# Patient Record
Sex: Female | Born: 1960 | Race: White | Hispanic: No | Marital: Married | Smoking: Never smoker
Health system: Southern US, Community
[De-identification: ages and names within clinical notes are randomized; demographics above are authoritative.]

## PROBLEM LIST (undated history)

## (undated) DIAGNOSIS — M069 Rheumatoid arthritis, unspecified: Secondary | ICD-10-CM

## (undated) DIAGNOSIS — E785 Hyperlipidemia, unspecified: Secondary | ICD-10-CM

## (undated) DIAGNOSIS — J45909 Unspecified asthma, uncomplicated: Secondary | ICD-10-CM

## (undated) DIAGNOSIS — F419 Anxiety disorder, unspecified: Secondary | ICD-10-CM

## (undated) DIAGNOSIS — F32A Depression, unspecified: Secondary | ICD-10-CM

## (undated) DIAGNOSIS — G4730 Sleep apnea, unspecified: Secondary | ICD-10-CM

## (undated) DIAGNOSIS — Z9109 Other allergy status, other than to drugs and biological substances: Secondary | ICD-10-CM

## (undated) DIAGNOSIS — F329 Major depressive disorder, single episode, unspecified: Secondary | ICD-10-CM

## (undated) DIAGNOSIS — E119 Type 2 diabetes mellitus without complications: Secondary | ICD-10-CM

## (undated) DIAGNOSIS — I10 Essential (primary) hypertension: Secondary | ICD-10-CM

## (undated) HISTORY — DX: Other allergy status, other than to drugs and biological substances: Z91.09

## (undated) HISTORY — PX: FOOT SURGERY: SHX648

## (undated) HISTORY — DX: Type 2 diabetes mellitus without complications: E11.9

## (undated) HISTORY — DX: Depression, unspecified: F32.A

## (undated) HISTORY — PX: OTHER SURGICAL HISTORY: SHX169

## (undated) HISTORY — DX: Sleep apnea, unspecified: G47.30

## (undated) HISTORY — DX: Unspecified asthma, uncomplicated: J45.909

## (undated) HISTORY — DX: Rheumatoid arthritis, unspecified: M06.9

## (undated) HISTORY — DX: Hyperlipidemia, unspecified: E78.5

## (undated) HISTORY — DX: Anxiety disorder, unspecified: F41.9

## (undated) HISTORY — PX: DENTAL SURGERY: SHX609

## (undated) HISTORY — DX: Major depressive disorder, single episode, unspecified: F32.9

---

## 1998-06-25 HISTORY — PX: CHOLECYSTECTOMY: SHX55

## 2005-05-23 ENCOUNTER — Ambulatory Visit: Payer: Self-pay | Admitting: Unknown Physician Specialty

## 2005-08-31 ENCOUNTER — Other Ambulatory Visit: Payer: Self-pay

## 2005-08-31 ENCOUNTER — Emergency Department: Payer: Self-pay | Admitting: Emergency Medicine

## 2006-07-03 ENCOUNTER — Ambulatory Visit: Payer: Self-pay | Admitting: Unknown Physician Specialty

## 2006-09-09 ENCOUNTER — Ambulatory Visit: Payer: Self-pay | Admitting: Internal Medicine

## 2007-12-15 ENCOUNTER — Emergency Department: Payer: Self-pay | Admitting: Emergency Medicine

## 2010-04-25 ENCOUNTER — Ambulatory Visit: Payer: Self-pay

## 2010-07-14 ENCOUNTER — Ambulatory Visit: Payer: Self-pay | Admitting: General Surgery

## 2010-07-17 LAB — PATHOLOGY REPORT

## 2011-09-26 ENCOUNTER — Ambulatory Visit: Payer: Self-pay | Admitting: Internal Medicine

## 2012-09-17 ENCOUNTER — Ambulatory Visit: Payer: Self-pay | Admitting: Physician Assistant

## 2013-10-08 ENCOUNTER — Other Ambulatory Visit: Payer: Self-pay | Admitting: Physician Assistant

## 2013-10-08 LAB — BASIC METABOLIC PANEL
ANION GAP: 7 (ref 7–16)
BUN: 16 mg/dL (ref 7–18)
CHLORIDE: 103 mmol/L (ref 98–107)
CREATININE: 0.71 mg/dL (ref 0.60–1.30)
Calcium, Total: 9.2 mg/dL (ref 8.5–10.1)
Co2: 28 mmol/L (ref 21–32)
EGFR (African American): >60
EGFR (Non-African Amer.): >60
GLUCOSE: 102 mg/dL — AB (ref 65–99)
OSMOLALITY: 277 (ref 275–301)
Potassium: 3.7 mmol/L (ref 3.5–5.1)
Sodium: 138 mmol/L (ref 136–145)

## 2013-10-08 LAB — CBC WITH DIFFERENTIAL/PLATELET
Basophil #: 0.1 10*3/uL (ref 0.0–0.1)
Basophil %: 0.6 %
EOS PCT: 4.2 %
Eosinophil #: 0.4 10*3/uL (ref 0.0–0.7)
HCT: 38.2 % (ref 35.0–47.0)
HGB: 13 g/dL (ref 12.0–16.0)
LYMPHS PCT: 39.6 %
Lymphocyte #: 3.6 10*3/uL (ref 1.0–3.6)
MCH: 30.7 pg (ref 26.0–34.0)
MCHC: 34 g/dL (ref 32.0–36.0)
MCV: 90 fL (ref 80–100)
MONO ABS: 0.5 x10 3/mm (ref 0.2–0.9)
MONOS PCT: 5.1 %
NEUTROS PCT: 50.5 %
Neutrophil #: 4.6 10*3/uL (ref 1.4–6.5)
PLATELETS: 245 10*3/uL (ref 150–440)
RBC: 4.23 10*6/uL (ref 3.80–5.20)
RDW: 14.1 % (ref 11.5–14.5)
WBC: 9.1 10*3/uL (ref 3.6–11.0)

## 2013-10-08 LAB — TSH: Thyroid Stimulating Horm: 2.24 u[IU]/mL

## 2014-08-11 ENCOUNTER — Ambulatory Visit: Payer: Self-pay | Admitting: Physician Assistant

## 2014-08-25 ENCOUNTER — Ambulatory Visit: Payer: Self-pay | Admitting: Internal Medicine

## 2015-10-04 DIAGNOSIS — K649 Unspecified hemorrhoids: Secondary | ICD-10-CM | POA: Diagnosis not present

## 2015-10-10 DIAGNOSIS — H6993 Unspecified Eustachian tube disorder, bilateral: Secondary | ICD-10-CM | POA: Diagnosis not present

## 2015-10-10 DIAGNOSIS — J301 Allergic rhinitis due to pollen: Secondary | ICD-10-CM | POA: Diagnosis not present

## 2015-10-10 DIAGNOSIS — J0191 Acute recurrent sinusitis, unspecified: Secondary | ICD-10-CM | POA: Diagnosis not present

## 2015-12-15 DIAGNOSIS — F3342 Major depressive disorder, recurrent, in full remission: Secondary | ICD-10-CM | POA: Diagnosis not present

## 2015-12-15 DIAGNOSIS — M159 Polyosteoarthritis, unspecified: Secondary | ICD-10-CM | POA: Diagnosis not present

## 2015-12-15 DIAGNOSIS — R7301 Impaired fasting glucose: Secondary | ICD-10-CM | POA: Diagnosis not present

## 2015-12-15 DIAGNOSIS — I10 Essential (primary) hypertension: Secondary | ICD-10-CM | POA: Diagnosis not present

## 2016-03-08 DIAGNOSIS — R7301 Impaired fasting glucose: Secondary | ICD-10-CM | POA: Diagnosis not present

## 2016-03-08 DIAGNOSIS — Z0001 Encounter for general adult medical examination with abnormal findings: Secondary | ICD-10-CM | POA: Diagnosis not present

## 2016-03-08 DIAGNOSIS — E782 Mixed hyperlipidemia: Secondary | ICD-10-CM | POA: Diagnosis not present

## 2016-03-08 DIAGNOSIS — I10 Essential (primary) hypertension: Secondary | ICD-10-CM | POA: Diagnosis not present

## 2016-03-12 DIAGNOSIS — R55 Syncope and collapse: Secondary | ICD-10-CM | POA: Diagnosis not present

## 2016-03-12 DIAGNOSIS — E1165 Type 2 diabetes mellitus with hyperglycemia: Secondary | ICD-10-CM | POA: Diagnosis not present

## 2016-03-21 DIAGNOSIS — R55 Syncope and collapse: Secondary | ICD-10-CM | POA: Diagnosis not present

## 2016-04-17 DIAGNOSIS — F3342 Major depressive disorder, recurrent, in full remission: Secondary | ICD-10-CM | POA: Diagnosis not present

## 2016-04-17 DIAGNOSIS — I10 Essential (primary) hypertension: Secondary | ICD-10-CM | POA: Diagnosis not present

## 2016-04-17 DIAGNOSIS — R55 Syncope and collapse: Secondary | ICD-10-CM | POA: Diagnosis not present

## 2016-04-17 DIAGNOSIS — E1165 Type 2 diabetes mellitus with hyperglycemia: Secondary | ICD-10-CM | POA: Diagnosis not present

## 2016-04-25 DIAGNOSIS — Z0100 Encounter for examination of eyes and vision without abnormal findings: Secondary | ICD-10-CM | POA: Diagnosis not present

## 2016-07-09 DIAGNOSIS — K219 Gastro-esophageal reflux disease without esophagitis: Secondary | ICD-10-CM | POA: Diagnosis not present

## 2016-07-09 DIAGNOSIS — I10 Essential (primary) hypertension: Secondary | ICD-10-CM | POA: Diagnosis not present

## 2016-07-09 DIAGNOSIS — F411 Generalized anxiety disorder: Secondary | ICD-10-CM | POA: Diagnosis not present

## 2016-07-09 DIAGNOSIS — E1165 Type 2 diabetes mellitus with hyperglycemia: Secondary | ICD-10-CM | POA: Diagnosis not present

## 2016-08-08 ENCOUNTER — Emergency Department: Payer: Self-pay

## 2016-08-08 ENCOUNTER — Emergency Department
Admission: EM | Admit: 2016-08-08 | Discharge: 2016-08-08 | Disposition: A | Discharge status: Home / Self Care | Payer: Self-pay | Attending: Emergency Medicine | Admitting: Emergency Medicine

## 2016-08-08 DIAGNOSIS — R0789 Other chest pain: Secondary | ICD-10-CM | POA: Insufficient documentation

## 2016-08-08 DIAGNOSIS — R079 Chest pain, unspecified: Secondary | ICD-10-CM

## 2016-08-08 DIAGNOSIS — Z79899 Other long term (current) drug therapy: Secondary | ICD-10-CM | POA: Insufficient documentation

## 2016-08-08 DIAGNOSIS — E119 Type 2 diabetes mellitus without complications: Secondary | ICD-10-CM | POA: Insufficient documentation

## 2016-08-08 DIAGNOSIS — I10 Essential (primary) hypertension: Secondary | ICD-10-CM | POA: Insufficient documentation

## 2016-08-08 HISTORY — DX: Essential (primary) hypertension: I10

## 2016-08-08 LAB — BASIC METABOLIC PANEL
Anion gap: 9 (ref 5–15)
BUN: 18 mg/dL (ref 6–20)
CHLORIDE: 101 mmol/L (ref 101–111)
CO2: 29 mmol/L (ref 22–32)
Calcium: 9.7 mg/dL (ref 8.9–10.3)
Creatinine, Ser: 0.88 mg/dL (ref 0.44–1.00)
GFR calc non Af Amer: >60 mL/min (ref >60)
Glucose, Bld: 139 mg/dL — ABNORMAL HIGH (ref 65–99)
Potassium: 3.7 mmol/L (ref 3.5–5.1)
SODIUM: 139 mmol/L (ref 135–145)

## 2016-08-08 LAB — TROPONIN I: Troponin I: <0.03 ng/mL (ref <0.03)

## 2016-08-08 LAB — CBC
HEMATOCRIT: 37.2 % (ref 35.0–47.0)
Hemoglobin: 12.9 g/dL (ref 12.0–16.0)
MCH: 30.1 pg (ref 26.0–34.0)
MCHC: 34.5 g/dL (ref 32.0–36.0)
MCV: 87.1 fL (ref 80.0–100.0)
Platelets: 241 10*3/uL (ref 150–440)
RBC: 4.28 MIL/uL (ref 3.80–5.20)
RDW: 13.9 % (ref 11.5–14.5)
WBC: 8.5 10*3/uL (ref 3.6–11.0)

## 2016-08-08 LAB — FIBRIN DERIVATIVES D-DIMER (ARMC ONLY): Fibrin derivatives D-dimer (ARMC): 279 (ref 0–499)

## 2016-08-08 NOTE — ED Provider Notes (Signed)
Time Seen: Approximately 1501  I have reviewed the triage notes  Chief Complaint: Chest Pain   History of Present Illness: Kristen Sparks is a 56 y.o. female who presents with intermittent left-sided chest discomfort. She states pain radiates to her left shoulder and underneath her left breast. She states the pain is very transient in nature and started yesterday. She states occasionally gets worse with movement and she's been doing a lot of lifting and moving of articles. She also has history of esophageal reflux disease. She has no cardiovascular history. She has risk factors for hypertension and diabetes. She denies any family history. Patient denies any fever or productive cough or pleuritic chest pain. She denies any abdominal discomfort. He shouldn't denies any arm pain or pleuritic discomfort. She denies any melena or hematochezia. She states she had a stress echocardiogram performed approximately a month ago. She states she's had a lot of burping and belching and vomited 2. No continuous nausea   Past Medical History:  Diagnosis Date  . Diabetes mellitus without complication ([place])   . Hypertension     There are no active problems to display for this patient.   Past Surgical History:  Procedure Laterality Date  . CHOLECYSTECTOMY      Past Surgical History:  Procedure Laterality Date  . CHOLECYSTECTOMY      Current Outpatient Rx  . Order #: [id number] Class: Historical Med  . Order #: [id number] Class: Historical Med  . Order #: [id number] Class: Historical Med    Allergies:  Azithromycin  Family History: No family history on file.  Social History: Social History  Substance Use Topics  . Smoking status: Never Smoker  . Smokeless tobacco: Never Used  . Alcohol use Yes     Review of Systems:   10 point review of systems was performed and was otherwise negative:  Constitutional: No fever Eyes: No visual disturbances ENT: No sore throat, ear pain Cardiac: No  chest pain Respiratory: No shortness of breath, wheezing, or stridor Abdomen: No abdominal pain, no vomiting, No diarrhea Endocrine: No weight loss, No night sweats Extremities: No peripheral edema, cyanosis Skin: No rashes, easy bruising Neurologic: No focal weakness, trouble with speech or swollowing Urologic: No dysuria, Hematuria, or urinary frequency   Physical Exam:  ED Triage Vitals  Enc Vitals Group     BP 08/08/16 1144 (!) 143/83     Pulse Rate 08/08/16 1144 68     Resp 08/08/16 1144 18     Temp 08/08/16 1144 97.7 F (36.5 C)     Temp Source 08/08/16 1144 Oral     SpO2 08/08/16 1144 99 %     Weight 08/08/16 1139 230 lb (104.3 kg)     Height 08/08/16 1139 5\' 3"  (1.6 m)     Head Circumference --      Peak Flow --      Pain Score 08/08/16 1139 4     Pain Loc --      Pain Edu? --      Excl. in [place]? --     General: Awake , Alert , and Oriented times 3; GCS 15 Head: Normal cephalic , atraumatic Eyes: Pupils equal , round, reactive to light Nose/Throat: No nasal drainage, patent upper airway without erythema or exudate.  Neck: Supple, Full range of motion, No anterior adenopathy or palpable thyroid masses Lungs: Clear to ascultation without wheezes , rhonchi, or rales Heart: Regular rate, regular rhythm without murmurs , gallops , or rubs Abdomen: Soft, non  tender without rebound, guarding , or rigidity; bowel sounds positive and symmetric in all 4 quadrants. No organomegaly .        Extremities: 2 plus symmetric pulses. No edema, clubbing or cyanosis Neurologic: normal ambulation, Motor symmetric without deficits, sensory intact Skin: warm, dry, no rashes No obvious reproducible pain with chest wall palpation. No pain reproduced with movement of the left shoulder  Labs:   All laboratory work was reviewed including any pertinent negatives or positives listed below:  Labs Reviewed  BASIC METABOLIC PANEL - Abnormal; Notable for the following:       Result Value    Glucose, Bld 139 (*)    All other components within normal limits  CBC  TROPONIN I  FIBRIN DERIVATIVES D-DIMER ([facility] ONLY)  TROPONIN I  Laboratory work was reviewed and showed no clinically significant abnormalities.   EKG:  ED ECG REPORT I, Daymon Larsen, the attending physician, personally viewed and interpreted this ECG.  Date: 08/08/2016 EKG Time:1141Rate: 68 Rhythm: normal sinus rhythm QRS Axis: normal Intervals: normal ST/T Wave abnormalities: normal Conduction Disturbances: none Narrative Interpretation: unremarkable Normal EKG   Radiology:  "Dg Chest 2 View  Result Date: 08/08/2016 CLINICAL DATA:  Left-sided chest pain EXAM: CHEST  2 VIEW COMPARISON:  09/17/2012 FINDINGS: The heart size and mediastinal contours are within normal limits. Both lungs are clear. The visualized skeletal structures are unremarkable. IMPRESSION: No active cardiopulmonary disease. Electronically Signed   By: Inez Catalina M.D.   On: 08/08/2016 12:21  "  I personally reviewed the radiologic studies      ED Course: * Patient's stay here was uneventful and her differential includes all life-threatening causes for chest pain Differential includes all life-threatening causes for chest pain. This includes but is not exclusive to acute coronary syndrome, aortic dissection, pulmonary embolism, cardiac tamponade, community-acquired pneumonia, pericarditis, musculoskeletal chest wall pain, etc. Patient's stay here was uneventful and she had no further episodes of discomfort. She felt comfortable with the workup with negative troponins along with a normal-appearing EKG with approximately 24 hours of intermittent chest discomfort. I advised her workup is not complete and I felt this may be esophageal reflux disease, however she will need to follow up with her cardiologist and her primary physician for further outpatient testing Prilosec to twice a day and other supplements over-the-counter antacid  medications.     Assessment: Acute nonspecific chest pain  Final Clinical Impression: *  Final diagnoses:  Nonspecific chest pain     Plan:  Outpatient Patient was advised to return immediately if condition worsens. Patient was advised to follow up with their primary care physician or other specialized physicians involved in their outpatient care. The patient and/or family member/power of attorney had laboratory results reviewed at the bedside. All questions and concerns were addressed and appropriate discharge instructions were distributed by the nursing staff.             Daymon Larsen, MD 08/08/16 [phone]

## 2016-08-08 NOTE — Discharge Instructions (Signed)
Please return immediately if condition worsens. Please contact her primary physician or the physician you were given for referral. If you have any specialist physicians involved in her treatment and plan please also contact them. Thank you for using [facility].  Please increase your Prilosec to 40 mg twice a day. You can also add over-the-counter antacid medication and take Tylenol for pain. Please avoid Motrin or related products as these can actually irritate your stomach.

## 2016-08-08 NOTE — ED Notes (Signed)
AAOx3.  Skin warm and dry. NAD.  Ambulates with easy and steady gait.   

## 2016-08-08 NOTE — ED Triage Notes (Signed)
Pt c/o intermittent left sided chest pain that radiates into the shoulder and back that started yesterday with N/V , SOB

## 2016-10-08 DIAGNOSIS — I10 Essential (primary) hypertension: Secondary | ICD-10-CM | POA: Diagnosis not present

## 2016-10-08 DIAGNOSIS — E1165 Type 2 diabetes mellitus with hyperglycemia: Secondary | ICD-10-CM | POA: Diagnosis not present

## 2016-10-08 DIAGNOSIS — E6609 Other obesity due to excess calories: Secondary | ICD-10-CM | POA: Diagnosis not present

## 2016-10-08 DIAGNOSIS — F411 Generalized anxiety disorder: Secondary | ICD-10-CM | POA: Diagnosis not present

## 2016-11-09 DIAGNOSIS — I10 Essential (primary) hypertension: Secondary | ICD-10-CM | POA: Diagnosis not present

## 2016-11-09 DIAGNOSIS — K1230 Oral mucositis (ulcerative), unspecified: Secondary | ICD-10-CM | POA: Diagnosis not present

## 2016-11-16 DIAGNOSIS — D239 Other benign neoplasm of skin, unspecified: Secondary | ICD-10-CM | POA: Diagnosis not present

## 2016-11-16 DIAGNOSIS — L821 Other seborrheic keratosis: Secondary | ICD-10-CM | POA: Diagnosis not present

## 2016-11-28 DIAGNOSIS — T887XXA Unspecified adverse effect of drug or medicament, initial encounter: Secondary | ICD-10-CM | POA: Diagnosis not present

## 2016-11-28 DIAGNOSIS — R0602 Shortness of breath: Secondary | ICD-10-CM | POA: Diagnosis not present

## 2016-11-28 DIAGNOSIS — H00026 Hordeolum internum left eye, unspecified eyelid: Secondary | ICD-10-CM | POA: Diagnosis not present

## 2016-11-28 DIAGNOSIS — H00025 Hordeolum internum left lower eyelid: Secondary | ICD-10-CM | POA: Diagnosis not present

## 2017-02-27 DIAGNOSIS — D225 Melanocytic nevi of trunk: Secondary | ICD-10-CM | POA: Diagnosis not present

## 2017-02-27 DIAGNOSIS — W57XXXA Bitten or stung by nonvenomous insect and other nonvenomous arthropods, initial encounter: Secondary | ICD-10-CM | POA: Diagnosis not present

## 2017-05-17 ENCOUNTER — Other Ambulatory Visit: Payer: Self-pay

## 2017-05-17 ENCOUNTER — Encounter: Payer: Self-pay | Admitting: Emergency Medicine

## 2017-05-17 ENCOUNTER — Emergency Department: Payer: BLUE CROSS/BLUE SHIELD

## 2017-05-17 ENCOUNTER — Emergency Department
Admission: EM | Admit: 2017-05-17 | Discharge: 2017-05-17 | Disposition: A | Discharge status: Home / Self Care | Payer: BLUE CROSS/BLUE SHIELD | Attending: Emergency Medicine | Admitting: Emergency Medicine

## 2017-05-17 DIAGNOSIS — R197 Diarrhea, unspecified: Secondary | ICD-10-CM | POA: Diagnosis not present

## 2017-05-17 DIAGNOSIS — Z79899 Other long term (current) drug therapy: Secondary | ICD-10-CM | POA: Diagnosis not present

## 2017-05-17 DIAGNOSIS — E119 Type 2 diabetes mellitus without complications: Secondary | ICD-10-CM | POA: Diagnosis not present

## 2017-05-17 DIAGNOSIS — R1013 Epigastric pain: Secondary | ICD-10-CM | POA: Diagnosis not present

## 2017-05-17 DIAGNOSIS — I10 Essential (primary) hypertension: Secondary | ICD-10-CM | POA: Insufficient documentation

## 2017-05-17 DIAGNOSIS — R079 Chest pain, unspecified: Secondary | ICD-10-CM | POA: Diagnosis not present

## 2017-05-17 DIAGNOSIS — R066 Hiccough: Secondary | ICD-10-CM | POA: Insufficient documentation

## 2017-05-17 LAB — HEPATIC FUNCTION PANEL
ALK PHOS: 126 U/L (ref 38–126)
ALT: 23 U/L (ref 14–54)
AST: 21 U/L (ref 15–41)
Albumin: 3.9 g/dL (ref 3.5–5.0)
Bilirubin, Direct: <0.1 mg/dL — ABNORMAL LOW (ref 0.1–0.5)
TOTAL PROTEIN: 7.4 g/dL (ref 6.5–8.1)
Total Bilirubin: 0.6 mg/dL (ref 0.3–1.2)

## 2017-05-17 LAB — BASIC METABOLIC PANEL
ANION GAP: 9 (ref 5–15)
BUN: 16 mg/dL (ref 6–20)
CO2: 25 mmol/L (ref 22–32)
Calcium: 9.6 mg/dL (ref 8.9–10.3)
Chloride: 105 mmol/L (ref 101–111)
Creatinine, Ser: 0.65 mg/dL (ref 0.44–1.00)
GLUCOSE: 113 mg/dL — AB (ref 65–99)
POTASSIUM: 3.9 mmol/L (ref 3.5–5.1)
Sodium: 139 mmol/L (ref 135–145)

## 2017-05-17 LAB — CBC
HEMATOCRIT: 39.1 % (ref 35.0–47.0)
HEMOGLOBIN: 13.2 g/dL (ref 12.0–16.0)
MCH: 30 pg (ref 26.0–34.0)
MCHC: 33.9 g/dL (ref 32.0–36.0)
MCV: 88.7 fL (ref 80.0–100.0)
Platelets: 243 10*3/uL (ref 150–440)
RBC: 4.4 MIL/uL (ref 3.80–5.20)
RDW: 14 % (ref 11.5–14.5)
WBC: 7.2 10*3/uL (ref 3.6–11.0)

## 2017-05-17 LAB — LIPASE, BLOOD: Lipase: 42 U/L (ref 11–51)

## 2017-05-17 LAB — TROPONIN I: Troponin I: <0.03 ng/mL (ref <0.03)

## 2017-05-17 MED ORDER — IOPAMIDOL (ISOVUE-300) INJECTION 61%
100.0000 mL | Freq: Once | INTRAVENOUS | Status: AC | PRN
  Administered 2017-05-17: 100 mL via INTRAVENOUS

## 2017-05-17 MED ORDER — CHLORPROMAZINE HCL 25 MG PO TABS
25.0000 mg | ORAL_TABLET | Freq: Once | ORAL | Status: AC
  Administered 2017-05-17: 25 mg via ORAL
  Filled 2017-05-17: qty 1

## 2017-05-17 MED ORDER — CHLORPROMAZINE HCL 10 MG PO TABS: 10.0000 mg | ORAL_TABLET | Freq: Three times a day (TID) | ORAL | 0 refills | Status: DC

## 2017-05-17 MED ORDER — GI COCKTAIL ~~LOC~~
30.0000 mL | Freq: Once | ORAL | Status: AC
  Administered 2017-05-17: 30 mL via ORAL
  Filled 2017-05-17: qty 30

## 2017-05-17 NOTE — ED Provider Notes (Addendum)
[facility] Emergency Department Provider Note  ____________________________________________   First MD Initiated Contact with Patient 05/17/17 1318     (approximate)  I have reviewed the triage vital signs and the nursing notes.   HISTORY  Chief Complaint Chest Pain   HPI Kristen Sparks is a 56 y.o. female here for evaluation of frequent belching for the last 2 days  Patient reports about 2-3 days ago she was developing some occasional loose stools, 1-2 loose bowel movements.  Also some mild nausea but no vomiting.  She reports she then began having frequent hiccuping and has been hiccuping almost every few seconds to 30 seconds for the last 2 straight days.  She reports she thinks having is now making her chest feels sore and she is experiencing a burning pain which she describes as "acid reflux" in her upper abdomen.  No fevers or chills.  No vomiting.  Denies "chest pain" rather reports a burning sensation.  Nothing seems to make it better or worse except if she drinks Maalox will go away for a couple minutes and then come back.  She does report similar symptoms off and on and has had previous evaluation including EGD and colonoscopy.  No trouble breathing.  No back pain.  No trouble with urination.  No pain or discomfort with urination.  Postmenopausal.  Past Medical History:  Diagnosis Date  . Diabetes mellitus without complication ([place])   . Hypertension     There are no active problems to display for this patient.   Past Surgical History:  Procedure Laterality Date  . CHOLECYSTECTOMY      Prior to Admission medications   Medication Sig Start Date End Date Taking? Authorizing Provider  ALPRAZolam Duanne Moron) 0.5 MG tablet Take 1 tablet by mouth daily. 01/29/17  Yes [provider]  benazepril-hydrochlorthiazide (LOTENSIN HCT) 10-12.5 MG tablet Take by mouth daily. Take 1 1/2 tablet daily 04/12/17  Yes [provider]  omeprazole  (PRILOSEC) 40 MG capsule Take 40 mg by mouth daily.   Yes [provider]  sertraline (ZOLOFT) 100 MG tablet Take 1 tablet by mouth daily. 04/11/17  Yes [provider]  traMADol (ULTRAM) 50 MG tablet Take 1-2 tablets by mouth daily. 02/14/17  Yes [provider]  chlorproMAZINE (THORAZINE) 10 MG tablet Take 1 tablet (10 mg total) by mouth 3 (three) times daily. 05/17/17   Delman Kitten, MD    Allergies Azithromycin  No family history on file.  Social History Social History   Tobacco Use  . Smoking status: Never Smoker  . Smokeless tobacco: Never Used  Substance Use Topics  . Alcohol use: Yes  . Drug use: Not on file    Review of Systems Constitutional: No fever/chills Eyes: No visual changes. ENT: No sore throat. Cardiovascular: Denies chest pain. Respiratory: Denies shortness of breath. Gastrointestinal: Couple loose stools without any black or bloody stools no constipation. Genitourinary: Negative for dysuria. Musculoskeletal: Negative for back pain. Skin: Negative for rash. Neurological: Negative for headaches, focal weakness or numbness.    ____________________________________________   PHYSICAL EXAM:  VITAL SIGNS: ED Triage Vitals  Enc Vitals Group     BP 05/17/17 1133 (!) 166/72     Pulse Rate 05/17/17 1133 68     Resp 05/17/17 1133 (!) 22     Temp 05/17/17 1133 98.5 F (36.9 C)     Temp Source 05/17/17 1133 Oral     SpO2 05/17/17 1133 97 %  Weight 05/17/17 1134 220 lb (99.8 kg)     Height 05/17/17 1134 5\' 5"  (1.651 m)     Head Circumference --      Peak Flow --      Pain Score 05/17/17 1138 4     Pain Loc --      Pain Edu? --      Excl. in [place]? --     Constitutional: Alert and oriented. Well appearing and in no acute distress.  He frequently belching. Eyes: Conjunctivae are normal. Head: Atraumatic. Nose: No congestion/rhinnorhea. Mouth/Throat: Mucous membranes are moist. Neck: No stridor.   Cardiovascular: Normal  rate, regular rhythm. Grossly normal heart sounds.  Good peripheral circulation. Respiratory: Normal respiratory effort.  No retractions. Lungs CTAB. Gastrointestinal: Soft and nontender except for minimal discomfort in the epigastrium and left upper quadrant without rebound or guarding. No distention. Musculoskeletal: No lower extremity tenderness nor edema. Neurologic:  Normal speech and language. No gross focal neurologic deficits are appreciated.  Skin:  Skin is warm, dry and intact. No rash noted. Psychiatric: Mood and affect are normal. Speech and behavior are normal.  ____________________________________________   LABS (all labs ordered are listed, but only abnormal results are displayed)  Labs Reviewed  BASIC METABOLIC PANEL - Abnormal; Notable for the following components:      Result Value   Glucose, Bld 113 (*)    All other components within normal limits  HEPATIC FUNCTION PANEL - Abnormal; Notable for the following components:   Bilirubin, Direct <0.1 (*)    All other components within normal limits  CBC  TROPONIN I  LIPASE, BLOOD  TROPONIN I   ____________________________________________  EKG  Reviewed and interpreted by me at 1145 Heart rate 70 QRS 70 QTC 410 Normal sinus rhythm, Q waves noted in inferior lead III as well as poor R wave progression across V2 and V3. No acute ischemic t wave abnormalities. Q wave now apparent and new from prior ekg on 08/08/2016 of unclear signficance ____________________________________________  RADIOLOGY  No results found.  CT scan reviewed, no acute findings.  Patient reports she has known fibroids.  Chest x-ray reviewed, no acute pulmonary disease. ____________________________________________   PROCEDURES  Procedure(s) performed: None  Procedures  Critical Care performed: No  ____________________________________________   INITIAL IMPRESSION / ASSESSMENT AND PLAN / ED COURSE  Pertinent labs & imaging results  that were available during my care of the patient were reviewed by me and considered in my medical decision making (see chart for details).  Differential diagnosis includes, but is not limited to, biliary disease (biliary colic, acute cholecystitis, cholangitis, choledocholithiasis, etc), intrathoracic causes for epigastric abdominal pain including ACS, gastritis, duodenitis, pancreatitis, small bowel or large bowel obstruction, abdominal aortic aneurysm, hernia, and gastritis.  ----------------------------------------- 3:03 PM on 05/17/2017 -----------------------------------------  CT scan reassuring.  After Thorazine patient reports all of her hiccuping and discomfort is gone away.  She is currently resting comfortably reporting feeling much improved.  There is likely some type of gastritis or possibly dyspepsia/reflux leading to her presentation today.  First troponin normal, EKG no acute ischemic T wave abnormality.  Very atypical symptoms and I doubt this represents acute coronary syndrome.  However, precaution early I will send a second troponin and if this is negative discharge the patient home. Clinical Course as of Jun 03 700  Fri May 17, 2017  1629 I received signout on this patient from Dr. Jacqualine Code.  Patient was pending repeat troponin.  Plan was to discharge home if  repeat troponin was negative.  The repeat troponin is negative, and patient states she feels well to go home.  [SS]    Clinical Course User Index [SS] Arta Silence, MD   Patient is agreeable to plan to follow-up with primary care doctor.  Ongoing plan including follow-up on second troponin signed to Dr. Cherylann Banas  ____________________________________________   FINAL CLINICAL IMPRESSION(S) / ED DIAGNOSES  Final diagnoses:  Dyspepsia      NEW MEDICATIONS STARTED DURING THIS VISIT:  This SmartLink is deprecated. Use AVSMEDLIST instead to display the medication list for a patient.   Note:  This document  was prepared using Dragon voice recognition software and may include unintentional dictation errors.     Delman Kitten, MD 05/17/17 1546    Delman Kitten, MD 06/03/17 [phone]

## 2017-05-17 NOTE — ED Notes (Signed)
Pt does not want to have an IV and does not want to by hook up to the monitor at this time.

## 2017-05-17 NOTE — ED Triage Notes (Signed)
ARrives with C/O chest pain and diarrhea and nausea x 2 day.

## 2017-05-17 NOTE — Discharge Instructions (Signed)

## 2017-06-25 DIAGNOSIS — G4730 Sleep apnea, unspecified: Secondary | ICD-10-CM

## 2017-06-25 HISTORY — DX: Sleep apnea, unspecified: G47.30

## 2017-06-26 ENCOUNTER — Ambulatory Visit: Payer: Self-pay | Admitting: Nurse Practitioner

## 2017-07-19 DIAGNOSIS — E782 Mixed hyperlipidemia: Secondary | ICD-10-CM | POA: Diagnosis not present

## 2017-07-19 DIAGNOSIS — E1165 Type 2 diabetes mellitus with hyperglycemia: Secondary | ICD-10-CM | POA: Diagnosis not present

## 2017-07-19 DIAGNOSIS — I10 Essential (primary) hypertension: Secondary | ICD-10-CM | POA: Diagnosis not present

## 2017-07-19 DIAGNOSIS — E559 Vitamin D deficiency, unspecified: Secondary | ICD-10-CM | POA: Diagnosis not present

## 2017-07-19 DIAGNOSIS — Z0001 Encounter for general adult medical examination with abnormal findings: Secondary | ICD-10-CM | POA: Diagnosis not present

## 2017-07-31 DIAGNOSIS — H5213 Myopia, bilateral: Secondary | ICD-10-CM | POA: Diagnosis not present

## 2017-08-02 ENCOUNTER — Encounter: Payer: Self-pay | Admitting: Internal Medicine

## 2017-08-02 DIAGNOSIS — F411 Generalized anxiety disorder: Secondary | ICD-10-CM | POA: Insufficient documentation

## 2017-08-02 DIAGNOSIS — E6609 Other obesity due to excess calories: Secondary | ICD-10-CM | POA: Insufficient documentation

## 2017-08-02 DIAGNOSIS — E1159 Type 2 diabetes mellitus with other circulatory complications: Secondary | ICD-10-CM | POA: Insufficient documentation

## 2017-08-02 DIAGNOSIS — E1165 Type 2 diabetes mellitus with hyperglycemia: Secondary | ICD-10-CM | POA: Insufficient documentation

## 2017-08-02 DIAGNOSIS — I10 Essential (primary) hypertension: Secondary | ICD-10-CM | POA: Insufficient documentation

## 2017-08-06 ENCOUNTER — Ambulatory Visit: Payer: Self-pay | Admitting: Internal Medicine

## 2017-08-28 ENCOUNTER — Encounter: Payer: Self-pay | Admitting: Internal Medicine

## 2017-08-28 ENCOUNTER — Ambulatory Visit: Payer: BLUE CROSS/BLUE SHIELD | Admitting: Internal Medicine

## 2017-08-28 VITALS — BP 138/92 | HR 81 | Resp 16 | Ht 63.0 in | Wt 231.4 lb

## 2017-08-28 DIAGNOSIS — M1991 Primary osteoarthritis, unspecified site: Secondary | ICD-10-CM

## 2017-08-28 DIAGNOSIS — F411 Generalized anxiety disorder: Secondary | ICD-10-CM

## 2017-08-28 DIAGNOSIS — E119 Type 2 diabetes mellitus without complications: Secondary | ICD-10-CM

## 2017-08-28 DIAGNOSIS — E6609 Other obesity due to excess calories: Secondary | ICD-10-CM | POA: Diagnosis not present

## 2017-08-28 DIAGNOSIS — I10 Essential (primary) hypertension: Secondary | ICD-10-CM

## 2017-08-28 MED ORDER — ALPRAZOLAM 0.5 MG PO TABS: ORAL_TABLET | ORAL | 2 refills | Status: DC

## 2017-08-28 MED ORDER — BENAZEPRIL-HYDROCHLOROTHIAZIDE 10-12.5 MG PO TABS: ORAL_TABLET | ORAL | 3 refills | Status: DC

## 2017-08-28 MED ORDER — SERTRALINE HCL 100 MG PO TABS: 100.0000 mg | ORAL_TABLET | Freq: Every day | ORAL | 3 refills | Status: DC

## 2017-08-28 MED ORDER — TRAMADOL-ACETAMINOPHEN 37.5-325 MG PO TABS: ORAL_TABLET | ORAL | 1 refills | Status: DC

## 2017-08-28 MED ORDER — OMEPRAZOLE 40 MG PO CPDR: 40.0000 mg | DELAYED_RELEASE_CAPSULE | Freq: Every day | ORAL | 3 refills | Status: DC

## 2017-08-28 NOTE — Progress Notes (Signed)
[facility] [place], [place] [address]  Internal MEDICINE  Office Visit Note  Patient Name: Kristen Sparks  [id number]  [id number]  Date of Service: 08/29/2017  Chief Complaint  Patient presents with  . Diabetes  . Hypertension  . Weight Gain    Diabetes  She presents for her follow-up diabetic visit. She has type 2 diabetes mellitus. Onset time: she is diet controlled  Pertinent negatives for hypoglycemia include no nervousness/anxiousness or tremors. There are no diabetic associated symptoms. Pertinent negatives for diabetes include no chest pain and no fatigue. (Hemoglobic A1c is 6.5.)  Hypertension  This is a chronic problem. The current episode started more than 1 year ago. The problem is controlled. Pertinent negatives include no anxiety, chest pain, neck pain, palpitations or shortness of breath. The current treatment provides moderate improvement.    Pt is continues to have problem with weight gain, she does follow a restricted calorie diet and does not exercise. C/O right ear pain and fullness. Will like to have all refills  Ongoing back pain and knee pain,    Current Medication: Outpatient Encounter Medications as of 08/28/2017  Medication Sig  . ALPRAZolam (XANAX) 0.5 MG tablet Take half to one tab aday prn  . lisinopril (PRINIVIL,ZESTRIL) 10 MG tablet Take 10 mg by mouth.  Marland Kitchen omeprazole (PRILOSEC) 40 MG capsule Take 1 capsule (40 mg total) by mouth daily.  . sertraline (ZOLOFT) 100 MG tablet Take 1 tablet (100 mg total) by mouth daily.  . traMADol (ULTRAM) 50 MG tablet Take 1-2 tablets by mouth daily.  . [DISCONTINUED] ALPRAZolam (XANAX) 0.5 MG tablet Take 1 tablet by mouth daily.  . [DISCONTINUED] omeprazole (PRILOSEC) 40 MG capsule Take 40 mg by mouth daily.  . [DISCONTINUED] sertraline (ZOLOFT) 100 MG tablet Take 1 tablet by mouth daily.  . benazepril-hydrochlorthiazide (LOTENSIN HCT) 10-12.5 MG tablet Take 1 1/2 tablet daily  . chlorproMAZINE  (THORAZINE) 10 MG tablet Take 1 tablet (10 mg total) by mouth 3 (three) times daily. (Patient not taking: Reported on 08/28/2017)  . traMADol-acetaminophen (ULTRACET) 37.5-325 MG tablet Take one tab po qd prn  . [DISCONTINUED] benazepril (LOTENSIN) 10 MG tablet Take by mouth.  . [DISCONTINUED] benazepril-hydrochlorthiazide (LOTENSIN HCT) 10-12.5 MG tablet Take by mouth daily. Take 1 1/2 tablet daily  . [DISCONTINUED] traMADol-acetaminophen (ULTRACET) 37.5-325 MG tablet Take by mouth.   No facility-administered encounter medications on file as of 08/28/2017.     Surgical History: Past Surgical History:  Procedure Laterality Date  . CHOLECYSTECTOMY    . colon polyectomy    . FOOT SURGERY      Medical History: Past Medical History:  Diagnosis Date  . Anxiety   . Asthma   . Depression   . Diabetes mellitus without complication ([place])   . Environmental allergies   . Hyperlipidemia   . Hypertension     Family History: Family History  Problem Relation Age of Onset  . Diabetes Mother   . COPD Mother     Social History   Socioeconomic History  . Marital status: Married    Spouse name: Not on file  . Number of children: Not on file  . Years of education: Not on file  . Highest education level: Not on file  Social Needs  . Financial resource strain: Not on file  . Food insecurity - worry: Not on file  . Food insecurity - inability: Not on file  . Transportation needs - medical: Not on file  .  Transportation needs - non-medical: Not on file  Occupational History  . Not on file  Tobacco Use  . Smoking status: Never Smoker  . Smokeless tobacco: Never Used  Substance and Sexual Activity  . Alcohol use: Yes  . Drug use: No  . Sexual activity: Not on file  Other Topics Concern  . Not on file  Social History Narrative  . Not on file      Review of Systems  Constitutional: Negative for chills, fatigue and unexpected weight change.  HENT: Positive for ear pain and  postnasal drip. Negative for congestion, rhinorrhea, sneezing and sore throat.   Eyes: Negative for redness.  Respiratory: Negative for cough, chest tightness and shortness of breath.   Cardiovascular: Negative for chest pain and palpitations.  Gastrointestinal: Negative for abdominal pain, constipation, diarrhea, nausea and vomiting.  Genitourinary: Negative for dysuria and frequency.  Musculoskeletal: Negative for arthralgias, back pain, joint swelling and neck pain.  Skin: Negative for rash.  Neurological: Negative.  Negative for tremors and numbness.  Psychiatric/Behavioral: Negative for behavioral problems (Depression), sleep disturbance and suicidal ideas. The patient is not nervous/anxious.     Vital Signs: BP (!) 138/92 (BP Location: Left Arm, Patient Position: Sitting)   Pulse 81   Resp 16   Ht 5\' 3"  (1.6 m)   Wt 231 lb 6.4 oz (105 kg)   SpO2 96%   BMI 40.99 kg/m    Physical Exam  Constitutional: She is oriented to person, place, and time. She appears well-nourished.  HENT:  Head: Normocephalic and atraumatic.  Right Ear: External ear normal.  Left Ear: External ear normal.  Mouth/Throat: No oropharyngeal exudate.  Eyes: Conjunctivae are normal. Pupils are equal, round, and reactive to light.  Neck: Normal range of motion. Neck supple.  Cardiovascular: Normal rate and regular rhythm.  No murmur heard. Pulmonary/Chest: Breath sounds normal.  Abdominal: Soft.  Musculoskeletal: Normal range of motion.  Neurological: She is alert and oriented to person, place, and time.  Skin: Skin is warm and dry.  Psychiatric: She has a normal mood and affect. Her behavior is normal. Thought content normal.    Assessment/Plan: 1. Diabetes mellitus without complication ([place]) - Declined therapy at this time, will monitor her diet   2. Essential (primary) hypertension - benazepril-hydrochlorthiazide (LOTENSIN HCT) 10-12.5 MG tablet; Take 1 1/2 tablet daily  Dispense: 135 tablet;  Refill: 3  3. Other obesity due to excess calories - Restricted calorie diet   4. Generalized anxiety disorder - sertraline (ZOLOFT) 100 MG tablet; Take 1 tablet (100 mg total) by mouth daily.  Dispense: 90 tablet; Refill: 3 - ALPRAZolam (XANAX) 0.5 MG tablet; Take half to one tab aday prn  Dispense: 20 tablet; Refill: 2  5. Primary localized osteoarthrosis - traMADol-acetaminophen (ULTRACET) 37.5-325 MG tablet; Take one tab po qd prn  Dispense: 90 tablet; Refill: 1  General Counseling: Marshawn verbalizes understanding of the findings of todays visit and agrees with plan of treatment. I have discussed any further diagnostic evaluation that may be needed or ordered today. We also reviewed her medications today. she has been encouraged to call the office with any questions or concerns that should arise related to todays visit.  Obesity Counseling: Risk Assessment: An assessment of behavioral risk factors was made today and includes lack of exercise sedentary lifestyle, lack of portion control and poor dietary habits.  Risk Modification Advice: She was counseled on portion control guidelines. Restricting daily caloric intake to. . The detrimental long term effects of  obesity on her health and ongoing poor compliance was also discussed with the patient.  Meds ordered this encounter  Medications  . benazepril-hydrochlorthiazide (LOTENSIN HCT) 10-12.5 MG tablet    Sig: Take 1 1/2 tablet daily    Dispense:  135 tablet    Refill:  3  . sertraline (ZOLOFT) 100 MG tablet    Sig: Take 1 tablet (100 mg total) by mouth daily.    Dispense:  90 tablet    Refill:  3  . omeprazole (PRILOSEC) 40 MG capsule    Sig: Take 1 capsule (40 mg total) by mouth daily.    Dispense:  90 capsule    Refill:  3  . traMADol-acetaminophen (ULTRACET) 37.5-325 MG tablet    Sig: Take one tab po qd prn    Dispense:  90 tablet    Refill:  1  . ALPRAZolam (XANAX) 0.5 MG tablet    Sig: Take half to one tab aday prn     Dispense:  20 tablet    Refill:  2    Time spent:25 Minutes  Dr Lavera Guise Internal medicine

## 2017-09-11 DIAGNOSIS — L299 Pruritus, unspecified: Secondary | ICD-10-CM | POA: Diagnosis not present

## 2017-09-16 ENCOUNTER — Other Ambulatory Visit: Payer: Self-pay | Admitting: Internal Medicine

## 2017-09-19 ENCOUNTER — Telehealth: Payer: Self-pay

## 2017-09-20 ENCOUNTER — Telehealth: Payer: Self-pay | Admitting: Internal Medicine

## 2017-09-20 NOTE — Telephone Encounter (Signed)
Called in tramadol 50 mg #90 with no refills to [facility] in [place] per [facility] and cancelled tramadol w/acetam rx.  dbs

## 2017-09-20 NOTE — Telephone Encounter (Signed)
Sent message to [facility] to review

## 2017-10-07 ENCOUNTER — Telehealth: Payer: Self-pay

## 2017-10-07 ENCOUNTER — Encounter: Payer: Self-pay | Admitting: Emergency Medicine

## 2017-10-07 ENCOUNTER — Emergency Department
Admission: EM | Admit: 2017-10-07 | Discharge: 2017-10-07 | Disposition: A | Discharge status: Home / Self Care | Payer: BLUE CROSS/BLUE SHIELD | Attending: Emergency Medicine | Admitting: Emergency Medicine

## 2017-10-07 ENCOUNTER — Emergency Department: Payer: BLUE CROSS/BLUE SHIELD

## 2017-10-07 ENCOUNTER — Other Ambulatory Visit: Payer: Self-pay

## 2017-10-07 DIAGNOSIS — Z5321 Procedure and treatment not carried out due to patient leaving prior to being seen by health care provider: Secondary | ICD-10-CM | POA: Insufficient documentation

## 2017-10-07 DIAGNOSIS — R51 Headache: Secondary | ICD-10-CM | POA: Diagnosis not present

## 2017-10-07 DIAGNOSIS — R1110 Vomiting, unspecified: Secondary | ICD-10-CM | POA: Diagnosis not present

## 2017-10-07 LAB — COMPREHENSIVE METABOLIC PANEL
ALT: 34 U/L (ref 14–54)
AST: 25 U/L (ref 15–41)
Albumin: 4.4 g/dL (ref 3.5–5.0)
Alkaline Phosphatase: 135 U/L — ABNORMAL HIGH (ref 38–126)
Anion gap: 10 (ref 5–15)
BUN: 21 mg/dL — AB (ref 6–20)
CHLORIDE: 103 mmol/L (ref 101–111)
CO2: 27 mmol/L (ref 22–32)
Calcium: 9.9 mg/dL (ref 8.9–10.3)
Creatinine, Ser: 0.58 mg/dL (ref 0.44–1.00)
Glucose, Bld: 135 mg/dL — ABNORMAL HIGH (ref 65–99)
Potassium: 3.5 mmol/L (ref 3.5–5.1)
SODIUM: 140 mmol/L (ref 135–145)
Total Bilirubin: 0.4 mg/dL (ref 0.3–1.2)
Total Protein: 7.9 g/dL (ref 6.5–8.1)

## 2017-10-07 LAB — TROPONIN I

## 2017-10-07 LAB — DIFFERENTIAL
BASOS ABS: 0.1 10*3/uL (ref 0–0.1)
BASOS PCT: 1 %
EOS ABS: 0.3 10*3/uL (ref 0–0.7)
Eosinophils Relative: 3 %
Lymphocytes Relative: 39 %
Lymphs Abs: 3.7 10*3/uL — ABNORMAL HIGH (ref 1.0–3.6)
MONOS PCT: 5 %
Monocytes Absolute: 0.4 10*3/uL (ref 0.2–0.9)
NEUTROS ABS: 5 10*3/uL (ref 1.4–6.5)
Neutrophils Relative %: 52 %

## 2017-10-07 LAB — CBC
HEMATOCRIT: 39.6 % (ref 35.0–47.0)
Hemoglobin: 13.5 g/dL (ref 12.0–16.0)
MCH: 30.3 pg (ref 26.0–34.0)
MCHC: 34 g/dL (ref 32.0–36.0)
MCV: 89.1 fL (ref 80.0–100.0)
PLATELETS: 221 10*3/uL (ref 150–440)
RBC: 4.44 MIL/uL (ref 3.80–5.20)
RDW: 14.2 % (ref 11.5–14.5)
WBC: 9.6 10*3/uL (ref 3.6–11.0)

## 2017-10-07 LAB — APTT: APTT: 27 s (ref 24–36)

## 2017-10-07 LAB — PROTIME-INR
INR: 0.93
PROTHROMBIN TIME: 12.4 s (ref 11.4–15.2)

## 2017-10-07 NOTE — Telephone Encounter (Signed)
Pt called that she had possible mini strokes with symptoms of passing out, numbness, faintness, can't hold her arms up, and goes in and out. Pt said this has happened 4 times,but not today.   As per Dr. Clayborn Bigness she was advised to go to the ER because these were persistent symptoms.
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
## 2017-10-08 ENCOUNTER — Telehealth: Payer: Self-pay | Admitting: Internal Medicine

## 2017-10-09 NOTE — Progress Notes (Signed)
Per [employer] they will not cover tramadol/apap for the first time within 180 days are limited to a max supply of 7 days. Over this limit is not covered.titania

## 2017-10-09 NOTE — Telephone Encounter (Signed)
Left message for patient and gave her 4 week follow up per dr Trish Mage schedule and we can discuss her er visit that day, advised pt we did not have open availability before then and for her ct results should have been reviewed from the provider that ordered them and we can discuss in office at her visit. Kristen Sparks

## 2017-10-29 ENCOUNTER — Ambulatory Visit: Payer: BLUE CROSS/BLUE SHIELD | Admitting: Internal Medicine

## 2017-10-29 ENCOUNTER — Encounter: Payer: Self-pay | Admitting: Internal Medicine

## 2017-10-29 VITALS — BP 130/90 | HR 70 | Resp 16 | Ht 63.0 in | Wt 229.0 lb

## 2017-10-29 DIAGNOSIS — E6609 Other obesity due to excess calories: Secondary | ICD-10-CM

## 2017-10-29 DIAGNOSIS — E119 Type 2 diabetes mellitus without complications: Secondary | ICD-10-CM

## 2017-10-29 DIAGNOSIS — I10 Essential (primary) hypertension: Secondary | ICD-10-CM | POA: Diagnosis not present

## 2017-10-29 DIAGNOSIS — G479 Sleep disorder, unspecified: Secondary | ICD-10-CM

## 2017-10-29 DIAGNOSIS — G40A09 Absence epileptic syndrome, not intractable, without status epilepticus: Secondary | ICD-10-CM

## 2017-10-29 LAB — POCT GLYCOSYLATED HEMOGLOBIN (HGB A1C): Hemoglobin A1C: 6.3

## 2017-10-29 NOTE — Progress Notes (Signed)
[facility] [place], [place] [address]  Internal MEDICINE  Office Visit Note  Patient Name: Kristen Sparks  [id number]  [id number]  Date of Service: 11/04/2017  Chief Complaint  Patient presents with  . Nausea    Pt went to ER   . Emesis    HPI  Pt is here for routine follow up. She went to ED with c/o nausea, passing out ( almost) no SZ activity. Initila labs were ordered however she left without being seen by ED. She describes the episode of losing connection with her surroundings, stares at times. Pt does have problem with snoring and ED time fatigue. No sleep study has been done in the past, Baseline glucose is elevated, BP meds was increased on previous visit however pt admits not takin it as advised.    Current Medication: Outpatient Encounter Medications as of 10/29/2017  Medication Sig  . ALPRAZolam (XANAX) 0.5 MG tablet Take half to one tab aday prn  . benazepril-hydrochlorthiazide (LOTENSIN HCT) 10-12.5 MG tablet Take 1 1/2 tablet daily  . chlorproMAZINE (THORAZINE) 10 MG tablet Take 1 tablet (10 mg total) by mouth 3 (three) times daily. (Patient not taking: Reported on 08/28/2017)  . lisinopril (PRINIVIL,ZESTRIL) 10 MG tablet Take 10 mg by mouth.  Marland Kitchen omeprazole (PRILOSEC) 40 MG capsule TAKE ONE CAPSULE BY MOUTH EVERY MORNING THIRTY MINUTES PRIOR TO FOOD OR MEDICATIONS  . sertraline (ZOLOFT) 100 MG tablet Take 1 tablet (100 mg total) by mouth daily.  . traMADol (ULTRAM) 50 MG tablet Take 1-2 tablets by mouth daily.  . traMADol-acetaminophen (ULTRACET) 37.5-325 MG tablet Take one tab po qd prn   No facility-administered encounter medications on file as of 10/29/2017.     Surgical History: Past Surgical History:  Procedure Laterality Date  . CHOLECYSTECTOMY    . colon polyectomy    . FOOT SURGERY      Medical History: Past Medical History:  Diagnosis Date  . Anxiety   . Asthma   . Depression   . Diabetes mellitus without complication ([place])    . Environmental allergies   . Hyperlipidemia   . Hypertension     Family History: Family History  Problem Relation Age of Onset  . Diabetes Mother   . COPD Mother     Social History   Socioeconomic History  . Marital status: Married    Spouse name: Not on file  . Number of children: Not on file  . Years of education: Not on file  . Highest education level: Not on file  Occupational History  . Not on file  Social Needs  . Financial resource strain: Not on file  . Food insecurity:    Worry: Not on file    Inability: Not on file  . Transportation needs:    Medical: Not on file    Non-medical: Not on file  Tobacco Use  . Smoking status: Never Smoker  . Smokeless tobacco: Never Used  Substance and Sexual Activity  . Alcohol use: Yes  . Drug use: No  . Sexual activity: Not on file  Lifestyle  . Physical activity:    Days per week: Not on file    Minutes per session: Not on file  . Stress: Not on file  Relationships  . Social connections:    Talks on phone: Not on file    Gets together: Not on file    Attends religious service: Not on file    Active member of club or  organization: Not on file    Attends meetings of clubs or organizations: Not on file    Relationship status: Not on file  . Intimate partner violence:    Fear of current or ex partner: Not on file    Emotionally abused: Not on file    Physically abused: Not on file    Forced sexual activity: Not on file  Other Topics Concern  . Not on file  Social History Narrative  . Not on file   Review of Systems  Constitutional: Negative for chills, diaphoresis and fatigue.  HENT: Negative for ear pain, postnasal drip and sinus pressure.   Eyes: Negative for photophobia, discharge, redness, itching and visual disturbance.  Respiratory: Negative for cough, shortness of breath and wheezing.   Cardiovascular: Negative for chest pain, palpitations and leg swelling.  Gastrointestinal: Negative for abdominal  pain, constipation, diarrhea, nausea and vomiting.  Genitourinary: Negative for dysuria and flank pain.  Musculoskeletal: Negative for arthralgias, back pain, gait problem and neck pain.  Skin: Negative for color change.  Allergic/Immunologic: Negative for environmental allergies and food allergies.  Neurological: Negative for dizziness and headaches.  Hematological: Does not bruise/bleed easily.  Psychiatric/Behavioral: Negative for agitation, behavioral problems (depression) and hallucinations.   Vital Signs: BP 130/90   Pulse 70   Resp 16   Ht 5\' 3"  (1.6 m)   Wt 229 lb (103.9 kg)   SpO2 96%   BMI 40.57 kg/m   Physical Exam  Constitutional: She is oriented to person, place, and time. She appears well-developed and well-nourished. No distress.  HENT:  Head: Normocephalic and atraumatic.  Mouth/Throat: Oropharynx is clear and moist. No oropharyngeal exudate.  Eyes: Pupils are equal, round, and reactive to light. EOM are normal.  Neck: Normal range of motion. Neck supple. No JVD present. No tracheal deviation present. No thyromegaly present.  Cardiovascular: Normal rate, regular rhythm and normal heart sounds. Exam reveals no gallop and no friction rub.  No murmur heard. Pulmonary/Chest: Effort normal. No respiratory distress. She has no wheezes. She has no rales. She exhibits no tenderness.  Abdominal: Soft. Bowel sounds are normal.  Musculoskeletal: Normal range of motion.  Lymphadenopathy:    She has no cervical adenopathy.  Neurological: She is alert and oriented to person, place, and time. No cranial nerve deficit.  Skin: Skin is warm and dry. She is not diaphoretic.  Psychiatric: She has a normal mood and affect. Her behavior is normal. Judgment and thought content normal.   Assessment/Plan: 1. Sleep disturbances - PSG SLEEP STUDY; Future  2. Diabetes mellitus without complication (HCC) - Hemoglobin A1c - POCT HgB A1C  3. 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 Essential (primary) hypertension - Controlled   5. Other obesity due to excess calories Obesity Counseling: Risk Assessment: An assessment of behavioral risk factors was made today and includes lack of exercise sedentary lifestyle, lack of portion control and poor dietary habits.  Risk Modification Advice: She was counseled on portion control guidelines. Restricting daily caloric intake to. . The detrimental long term effects of obesity on her health and ongoing poor compliance was also discussed with the patient.   General Counseling: Synethia verbalizes understanding of the findings of todays visit and agrees with plan of treatment. I have discussed any further diagnostic evaluation that may be needed or ordered today. We also reviewed her medications today. she has been encouraged to call the office with  any questions or concerns that should arise related to todays visit.  Orders Placed This Encounter  Procedures  . Hemoglobin A1c  . POCT HgB A1C  . PSG SLEEP STUDY     Time spent25  Minutes   Dr Lavera Guise Internal medicine

## 2017-12-08 ENCOUNTER — Other Ambulatory Visit: Payer: Self-pay | Admitting: Internal Medicine

## 2017-12-17 ENCOUNTER — Other Ambulatory Visit (INDEPENDENT_AMBULATORY_CARE_PROVIDER_SITE_OTHER): Payer: BLUE CROSS/BLUE SHIELD | Admitting: Internal Medicine

## 2017-12-17 DIAGNOSIS — G4733 Obstructive sleep apnea (adult) (pediatric): Secondary | ICD-10-CM

## 2017-12-25 ENCOUNTER — Telehealth: Payer: Self-pay

## 2017-12-25 ENCOUNTER — Other Ambulatory Visit: Payer: Self-pay | Admitting: Internal Medicine

## 2017-12-25 NOTE — Telephone Encounter (Signed)
Patient was advised to call her pharmacy regarding a refill request..they should contact us for the refill; she also needs to keep her f/u appt with [email or web address]

## 2017-12-30 ENCOUNTER — Telehealth: Payer: Self-pay | Admitting: Internal Medicine

## 2017-12-30 ENCOUNTER — Ambulatory Visit: Payer: Self-pay | Admitting: Internal Medicine

## 2017-12-30 MED ORDER — TRAMADOL HCL 50 MG PO TABS: 50.0000 mg | ORAL_TABLET | Freq: Every day | ORAL | 0 refills | Status: DC

## 2017-12-30 NOTE — Telephone Encounter (Signed)
Tramadol rx called in to pharmacy. 

## 2017-12-30 NOTE — Procedures (Signed)
[facility] [address] [place], [place] [address]  Patient Name: Kristen Sparks DOB: 1961-02-12   SLEEP STUDY INTERPRETATION  DATE OF SERVICE: December 17, 2017   SLEEP STUDY HISTORY: This patient is referred to the sleep lab for a split night Polysomnography. Pertinent history includes a history of diagnosis of excessive daytime somnolence and snoring.  PROCEDURE: This overnight polysomnogram was performed using the Alice 5 acquisition system using the standard diagnostic protocol as outlined by the AASM. This includes 6 channels of EEG, 2 channelscannels of EOG, chin EMG, bilateral anterior tibialis EMG, nasal/oral thermister, PTAF, chest and abdominal wall movements, ECG and pulse oximetry. Apneas and Hypopneas were scored per AASM definition.  SLEEP ARCHITECHTURE: This is a split-night polysomnograph  study. The total recording time was 177 minutes and the patients total sleep time is noted to be 131.5 minutes. Sleep onset latency was 44.5 minutes and is prolonged.  Stage R sleep onset latency was 123 minutes. Sleep maintenance efficiency was 74.3 % and is reduced.  Sleep staging expressed as a percentage of total sleep time demonstrated 13.3 % N1, 41.4 % N2 and 38.4 % N3  sleep. Stage R represents 6.8 % of total sleep time. This is reduced.  There were a total of 25 arousals  for an overall arousal index of 12.2 per hour of sleep. PLMS arousal were not noted. Arousals without respiratory events are  noted. This can contribute to sleep architechture disruption.  RESPIRATORY MONITORING:   Patient exhibits some evidence of sleep disorderd breathing characterized by 0 central apneas, 13 obstructive apneas and 0 mixed apneas. There were 17 obstructive hypopneas and 0 RERAs. Most of the apneas/hypopneas were of obstructive variety. The total apnea hypopnea index (apneas and hypopneas per hour of sleep) is 13.7 respiratory events per hour and is mild.  Respiratory monitoring  demonstrated severe snoring through the night. There are a total of 288 snoring episodes representing 55.4 % of sleep.   Baseline oxygen saturation during wakefulness was 95 % and during NREM sleep averaged 94 % through the night. Arterial saturation during REM sleep was 94 % through the night. There was some significant  oxygen desaturation with the respiratory events. Arterial oxygen desaturation occurred of at least 4% was noted with a low saturation of 91 %. The study was performed off oxygen.  CARDIAC MONITORING:   Average heart rate is 61 during sleep with a high of 85 beats per minute. Malignant arrhythmias were not noted.  CPAP titration: Patient was started on CPAP titration per lab protocol.  The patient was started on a CPAP of 6 and titrated up to a CPAP of 9.  The optimal pressure appears to be around the CPAP of 8 cmH2O pressure.  On this pressure patient exhibited 100% sleep.  Patient also exhibited REM sleep.  There was no significant desaturation noted with the lowest saturation of 94%.  The apnea hypotony index was 0 on this pressure.  Patient also titrated the pressure well without any issues.  IMPRESSIONS:  --This overnight polysomnogram demonstrates presence of mild obstructive sleep apnea with an overall AHI 13.7 per hour. --The overall AHI was no worse  during Stage R. --There were associated insignificant arterial oxygen desaturations noted down to 91% --There was no significant PLMS noted in this study. --There is severe snoring noted throughout the study. --CPAP demonstrates good response to therapy.  The adequate CPAP pressure appears to be 8 cm water pressure and "correlation is recommended  RECOMMENDATIONS:  --CPAP titration study is adequate to show control of obstructive sleep apnea on a pressure of 8 cmH2O pressure. --Nasal decongestants and antihistamines may be of help for increased upper airways resistance when present. --Weight loss through dietary and  lifestyle modification is recommended in the presence of obesity. --A search for and treatment of any underlying cardiopulmonary disease is      recommended in the presence of oxygen desaturations. --Alternative treatment options if the patient is not willing to use CPAP include oral   appliances as well as surgical intervention which may help in the appropriate patient. --Clinical correlation is recommended. Please feel free to call the office for any further  questions or assistance in the care of this patient.     Allyne Gee, MD [facility] Pulmonary Critical Care Medicine Sleep medicine

## 2018-01-07 ENCOUNTER — Encounter: Payer: Self-pay | Admitting: Internal Medicine

## 2018-01-07 ENCOUNTER — Ambulatory Visit: Payer: BLUE CROSS/BLUE SHIELD | Admitting: Internal Medicine

## 2018-01-07 VITALS — BP 138/92 | HR 86 | Resp 16 | Ht 63.0 in | Wt 233.0 lb

## 2018-01-07 DIAGNOSIS — R0683 Snoring: Secondary | ICD-10-CM

## 2018-01-07 DIAGNOSIS — G4733 Obstructive sleep apnea (adult) (pediatric): Secondary | ICD-10-CM

## 2018-01-07 NOTE — Progress Notes (Signed)
[facility] [place], [place] [address]  Pulmonary Sleep Medicine   Office Visit Note  Patient Name: Kristen Sparks DOB: 03/15/1961 MRN [id number]  Date of Service: 01/07/2018  Complaints/HPI:  Patient is referred for evaluation of sleep apnea.  She had been experiencing of symptoms of increased fatigue tiredness.  She is related to her blood pressure medications.  She states that she was having out of body experiences.  She feels at times that she was only down.  Denies having any actual seizures.  She woke up somewhat tired.  She does have snoring.  She denies falling asleep while driving.  She denies falling asleep at the wheel or at a red light.  The patient states that she was becoming sleepy in meetings in when she was talking to people.  She was referred for sleep study.  She had a split night study done and the study showed that she had presence of mild obstructive sleep apnea with an apnea-hypopnea index of 13.7 per hour.  There was no severe desaturation but she did have severe snoring.  She was started on CPAP during the study and it appears that on a pressure of 8 she had no apneas and no hypopneas with good response and 100% sleep.  This study results were discussed with her in length today.  ROS  General: (-) fever, (-) chills, (-) night sweats, (-) weakness Skin: (-) rashes, (-) itching,. Eyes: (-) visual changes, (-) redness, (-) itching. Nose and Sinuses: (-) nasal stuffiness or itchiness, (-) postnasal drip, (-) nosebleeds, (-) sinus trouble. Mouth and Throat: (-) sore throat, (-) hoarseness. Neck: (-) swollen glands, (-) enlarged thyroid, (-) neck pain. Respiratory: - cough, (-) bloody sputum, - shortness of breath, - wheezing. Cardiovascular: - ankle swelling, (-) chest pain. Lymphatic: (-) lymph node enlargement. Neurologic: (-) numbness, (-) tingling. Psychiatric: (-) anxiety, (-) depression   Current Medication: Outpatient Encounter  Medications as of 01/07/2018  Medication Sig  . ALPRAZolam (XANAX) 0.5 MG tablet Take half to one tab aday prn  . benazepril-hydrochlorthiazide (LOTENSIN HCT) 10-12.5 MG tablet Take 1 1/2 tablet daily  . omeprazole (PRILOSEC) 40 MG capsule TAKE ONE CAPSULE BY MOUTH EVERY MORNING THIRTY MINUTES PRIOR TO FOOD OR MEDICATIONS  . sertraline (ZOLOFT) 100 MG tablet Take 1 tablet (100 mg total) by mouth daily.  . traMADol (ULTRAM) 50 MG tablet Take 1-2 tablets (50-100 mg total) by mouth daily.  . benazepril-hydrochlorthiazide (LOTENSIN HCT) 10-12.5 MG tablet TAKE ONE AND ONE-HALF TABLETS BY MOUTH EVERY DAY  . chlorproMAZINE (THORAZINE) 10 MG tablet Take 1 tablet (10 mg total) by mouth 3 (three) times daily. (Patient not taking: Reported on 08/28/2017)  . lisinopril (PRINIVIL,ZESTRIL) 10 MG tablet Take 10 mg by mouth.  . traMADol-acetaminophen (ULTRACET) 37.5-325 MG tablet Take one tab po qd prn (Patient not taking: Reported on 01/07/2018)   No facility-administered encounter medications on file as of 01/07/2018.     Surgical History: Past Surgical History:  Procedure Laterality Date  . CHOLECYSTECTOMY    . colon polyectomy    . FOOT SURGERY      Medical History: Past Medical History:  Diagnosis Date  . Anxiety   . Asthma   . Depression   . Diabetes mellitus without complication ([place])   . Environmental allergies   . Hyperlipidemia   . Hypertension     Family History: Family History  Problem Relation Age of Onset  . Diabetes Mother   . COPD Mother  Social History: Social History   Socioeconomic History  . Marital status: Married    Spouse name: Not on file  . Number of children: Not on file  . Years of education: Not on file  . Highest education level: Not on file  Occupational History  . Not on file  Social Needs  . Financial resource strain: Not on file  . Food insecurity:    Worry: Not on file    Inability: Not on file  . Transportation needs:    Medical: Not on file     Non-medical: Not on file  Tobacco Use  . Smoking status: Never Smoker  . Smokeless tobacco: Never Used  Substance and Sexual Activity  . Alcohol use: Yes  . Drug use: No  . Sexual activity: Not on file  Lifestyle  . Physical activity:    Days per week: Not on file    Minutes per session: Not on file  . Stress: Not on file  Relationships  . Social connections:    Talks on phone: Not on file    Gets together: Not on file    Attends religious service: Not on file    Active member of club or organization: Not on file    Attends meetings of clubs or organizations: Not on file    Relationship status: Not on file  . Intimate partner violence:    Fear of current or ex partner: Not on file    Emotionally abused: Not on file    Physically abused: Not on file    Forced sexual activity: Not on file  Other Topics Concern  . Not on file  Social History Narrative  . Not on file    Vital Signs: Blood pressure (!) 138/92, pulse 86, resp. rate 16, height 5\' 3"  (1.6 m), weight 233 lb (105.7 kg), SpO2 94 %.  Examination: General Appearance: The patient is well-developed, well-nourished, and in no distress. Skin: Gross inspection of skin unremarkable. Head: normocephalic, no gross deformities. Eyes: no gross deformities noted. ENT: ears appear grossly normal no exudates. Neck: Supple. No thyromegaly. No LAD. Respiratory: no rhonchi noted. Cardiovascular: Normal S1 and S2 without murmur or rub. Extremities: No cyanosis. pulses are equal. Neurologic: Alert and oriented. No involuntary movements.  LABS: Recent Results (from the past 2160 hour(s))  POCT HgB A1C     Status: None   Collection Time: 10/29/17 12:12 PM  Result Value Ref Range   Hemoglobin A1C 6.3     Radiology: Ct Head Wo Contrast  Result Date: 10/07/2017 CLINICAL DATA:  Headache and vomiting.  Perioral numbness. EXAM: CT HEAD WITHOUT CONTRAST TECHNIQUE: Contiguous axial images were obtained from the base of the skull  through the vertex without intravenous contrast. COMPARISON:  None. FINDINGS: Brain: Minimally enlarged ventricles and subarachnoid spaces. Otherwise, normal appearing cerebral hemispheres and posterior fossa structures. No intracranial hemorrhage, mass lesion or CT evidence of acute infarction. Vascular: No hyperdense vessel or unexpected calcification. Skull: Normal. Negative for fracture or focal lesion. Sinuses/Orbits: Unremarkable. Other: None. IMPRESSION: 1. No acute abnormality. 2. Minimal diffuse cerebral and cerebellar atrophy. Electronically Signed   By: Claudie Revering M.D.   On: 10/07/2017 18:31    No results found.  No results found.    Assessment and Plan: Patient Active Problem List   Diagnosis Date Noted  . Type 2 diabetes mellitus with hyperglycemia ([place]) 08/02/2017  . Essential (primary) hypertension 08/02/2017  . Other obesity due to excess calories 08/02/2017  . Generalized anxiety disorder  08/02/2017    1. OSA  She has mild obstructive sleep apnea based on her current sleep study with an apnea-hypopnea index of 13.7.  Granted in the time that was spent on the baseline portion of the study could have been more however based on the results she still does have significant disorder.  When I discussed with her the treatment options including CPAP she stated that she did not want to try CPAP.  I gave her the option also of trying oral appliances in she stated that she would think about that.  She feels the right now she does not need CPAP.  I did discuss with her the risks involved including cardiovascular complications such as but not limited to strokes heart attacks hypertension and also on possibility of dementia as a complication.  She was competent to understand the potential risks however she stated that she still did not want to use CPAP. 2. Morbid Obesity  We discussed weight loss as a potential form of therapy she states that she eats all the bad foods I discussed with her  diet as well as exercise.  She states that she will discuss this further with her primary care physician.  She has not been very good about being able to lose weight. 3. Anxiety  She will be continued on present management.  We will continue with supportive care.   4. Snoring secondary to above once again she does not really want any intervention done at this time.  Options were given to her and also gave her the option of calling me back if she does change her mind.  General Counseling: I have discussed the findings of the evaluation and examination with Kristen Sparks.  I have also discussed any further diagnostic evaluation thatmay be needed or ordered today. Kristen Sparks verbalizes understanding of the findings of todays visit. We also reviewed her medications today and discussed drug interactions and side effects including but not limited excessive drowsiness and altered mental states. We also discussed that there is always a risk not just to her but also people around her. she has been encouraged to call the office with any questions or concerns that should arise related to todays visit.    Time spent: 65min  I have personally obtained a history, examined the patient, evaluated laboratory and imaging results, formulated the assessment and plan and placed orders.    Allyne Gee, MD [facility] Pulmonary and Critical Care Sleep medicine

## 2018-01-07 NOTE — Patient Instructions (Signed)

## 2018-01-28 ENCOUNTER — Other Ambulatory Visit: Payer: Self-pay | Admitting: Nurse Practitioner

## 2018-01-28 ENCOUNTER — Telehealth: Payer: Self-pay

## 2018-01-28 DIAGNOSIS — M1991 Primary osteoarthritis, unspecified site: Secondary | ICD-10-CM

## 2018-01-28 MED ORDER — TRAMADOL HCL 50 MG PO TABS: 50.0000 mg | ORAL_TABLET | Freq: Every day | ORAL | 0 refills | Status: DC

## 2018-01-28 NOTE — Progress Notes (Signed)
Renewed tramadol for 30 days until next appointment. Sent to her pharmacy.

## 2018-01-28 NOTE — Telephone Encounter (Signed)
Renewed tramadol for 30 days until next appointment. Sent to her pharmacy.

## 2018-01-28 NOTE — Telephone Encounter (Signed)
Spoke with patient and advised her we refilled her medication until next appointment. Beth

## 2018-01-28 NOTE — Telephone Encounter (Signed)
Pt advised  We refills  tramadol

## 2018-02-13 ENCOUNTER — Other Ambulatory Visit: Payer: Self-pay

## 2018-02-13 DIAGNOSIS — F411 Generalized anxiety disorder: Secondary | ICD-10-CM

## 2018-02-13 MED ORDER — SERTRALINE HCL 100 MG PO TABS: 100.0000 mg | ORAL_TABLET | Freq: Every day | ORAL | 0 refills | Status: DC

## 2018-02-14 ENCOUNTER — Other Ambulatory Visit: Payer: Self-pay

## 2018-02-26 ENCOUNTER — Ambulatory Visit: Payer: Self-pay | Admitting: Obstetrics and Gynecology

## 2018-02-26 ENCOUNTER — Other Ambulatory Visit: Payer: Self-pay

## 2018-02-26 MED ORDER — OMEPRAZOLE 40 MG PO CPDR: DELAYED_RELEASE_CAPSULE | ORAL | 3 refills | Status: DC

## 2018-02-28 ENCOUNTER — Encounter: Payer: Self-pay | Admitting: Adult Health

## 2018-02-28 ENCOUNTER — Ambulatory Visit: Payer: BLUE CROSS/BLUE SHIELD | Admitting: Adult Health

## 2018-02-28 VITALS — BP 130/80 | HR 82 | Ht 63.0 in | Wt 230.8 lb

## 2018-02-28 DIAGNOSIS — F32A Depression, unspecified: Secondary | ICD-10-CM

## 2018-02-28 DIAGNOSIS — I10 Essential (primary) hypertension: Secondary | ICD-10-CM

## 2018-02-28 DIAGNOSIS — F329 Major depressive disorder, single episode, unspecified: Secondary | ICD-10-CM

## 2018-02-28 DIAGNOSIS — E785 Hyperlipidemia, unspecified: Secondary | ICD-10-CM

## 2018-02-28 DIAGNOSIS — F411 Generalized anxiety disorder: Secondary | ICD-10-CM

## 2018-02-28 DIAGNOSIS — M1991 Primary osteoarthritis, unspecified site: Secondary | ICD-10-CM

## 2018-02-28 MED ORDER — TRAMADOL HCL 50 MG PO TABS: 50.0000 mg | ORAL_TABLET | Freq: Two times a day (BID) | ORAL | 1 refills | Status: DC

## 2018-02-28 NOTE — Patient Instructions (Signed)

## 2018-02-28 NOTE — Progress Notes (Signed)
[facility] [place], [place] [address]  Internal MEDICINE  Office Visit Note  Patient Name: Kristen Sparks  [id number]  [id number]  Date of Service: 03/11/2018  Chief Complaint  Patient presents with  . Anxiety  . Depression  . Hypertension  . Hyperlipidemia   HPI  Pt here for follow up on anxiety, depression, HTN, and HLD. She denies complaints at this time.  She is generally healthy.  She denies hospitalizations since last visit.  She reports her depression and anxiety is well controlled with medications.  She denies issues with her blood pressure currently. She does not recall the last time her cholesterol was measured. There are no records in epic or IMS for this.  Will repeat labs.     Current Medication: Outpatient Encounter Medications as of 02/28/2018  Medication Sig  . ALPRAZolam (XANAX) 0.5 MG tablet Take half to one tab aday prn  . omeprazole (PRILOSEC) 40 MG capsule TAKE ONE CAPSULE BY MOUTH EVERY MORNING THIRTY MINUTES PRIOR TO FOOD OR MEDICATIONS  . sertraline (ZOLOFT) 100 MG tablet Take 1 tablet (100 mg total) by mouth daily.  . traMADol (ULTRAM) 50 MG tablet Take 1 tablet (50 mg total) by mouth 2 (two) times daily.  . [DISCONTINUED] benazepril-hydrochlorthiazide (LOTENSIN HCT) 10-12.5 MG tablet Take 1 1/2 tablet daily (Patient taking differently: Take 1 tablet daily)  . [DISCONTINUED] benazepril-hydrochlorthiazide (LOTENSIN HCT) 10-12.5 MG tablet Take 1 tablet by mouth daily.  . [DISCONTINUED] traMADol (ULTRAM) 50 MG tablet Take 1-2 tablets (50-100 mg total) by mouth daily.  . [DISCONTINUED] benazepril-hydrochlorthiazide (LOTENSIN HCT) 10-12.5 MG tablet TAKE ONE AND ONE-HALF TABLETS BY MOUTH EVERY DAY  . [DISCONTINUED] chlorproMAZINE (THORAZINE) 10 MG tablet Take 1 tablet (10 mg total) by mouth 3 (three) times daily. (Patient not taking: Reported on 08/28/2017)  . [DISCONTINUED] lisinopril (PRINIVIL,ZESTRIL) 10 MG tablet Take 10 mg by mouth daily.   . [DISCONTINUED] traMADol-acetaminophen (ULTRACET) 37.5-325 MG tablet Take one tab po qd prn (Patient not taking: Reported on 01/07/2018)   No facility-administered encounter medications on file as of 02/28/2018.    Surgical History: Past Surgical History:  Procedure Laterality Date  . CHOLECYSTECTOMY    . colon polyectomy    . FOOT SURGERY     Medical History: Past Medical History:  Diagnosis Date  . Anxiety   . Asthma   . Depression   . Environmental allergies   . Hyperlipidemia   . Hypertension    Family History: Family History  Problem Relation Age of Onset  . Diabetes Mother   . COPD Mother    Social History   Socioeconomic History  . Marital status: Married    Spouse name: Not on file  . Number of children: Not on file  . Years of education: Not on file  . Highest education level: Not on file  Occupational History  . Not on file  Social Needs  . Financial resource strain: Not on file  . Food insecurity:    Worry: Not on file    Inability: Not on file  . Transportation needs:    Medical: Not on file    Non-medical: Not on file  Tobacco Use  . Smoking status: Never Smoker  . Smokeless tobacco: Never Used  Substance and Sexual Activity  . Alcohol use: Yes  . Drug use: No  . Sexual activity: Not on file  Lifestyle  . Physical activity:    Days per week: Not on file    Minutes  per session: Not on file  . Stress: Not on file  Relationships  . Social connections:    Talks on phone: Not on file    Gets together: Not on file    Attends religious service: Not on file    Active member of club or organization: Not on file    Attends meetings of clubs or organizations: Not on file    Relationship status: Not on file  . Intimate partner violence:    Fear of current or ex partner: Not on file    Emotionally abused: Not on file    Physically abused: Not on file    Forced sexual activity: Not on file  Other Topics Concern  . Not on file  Social History  Narrative  . Not on file      Review of Systems  Constitutional: Negative for chills, fatigue and unexpected weight change.  HENT: Negative for congestion, rhinorrhea, sneezing and sore throat.   Eyes: Negative for photophobia, pain and redness.  Respiratory: Negative for cough, chest tightness and shortness of breath.   Cardiovascular: Negative for chest pain and palpitations.  Gastrointestinal: Negative for abdominal pain, constipation, diarrhea, nausea and vomiting.  Endocrine: Negative.   Genitourinary: Negative for dysuria and frequency.  Musculoskeletal: Negative for arthralgias, back pain, joint swelling and neck pain.  Skin: Negative for rash.  Allergic/Immunologic: Negative.   Neurological: Negative for tremors and numbness.  Hematological: Negative for adenopathy. Does not bruise/bleed easily.  Psychiatric/Behavioral: Negative for behavioral problems and sleep disturbance. The patient is not nervous/anxious.    Vital Signs: BP 130/80   Pulse 82   Ht 5\' 3"  (1.6 m)   Wt 230 lb 12.8 oz (104.7 kg)   SpO2 98%   BMI 40.88 kg/m   Physical Exam  Constitutional: She is oriented to person, place, and time. She appears well-developed and well-nourished. No distress.  HENT:  Head: Normocephalic and atraumatic.  Mouth/Throat: Oropharynx is clear and moist. No oropharyngeal exudate.  Eyes: Pupils are equal, round, and reactive to light. EOM are normal.  Neck: Normal range of motion. Neck supple. No JVD present. No tracheal deviation present. No thyromegaly present.  Cardiovascular: Normal rate, regular rhythm and normal heart sounds. Exam reveals no gallop and no friction rub.  No murmur heard. Pulmonary/Chest: Effort normal and breath sounds normal. No respiratory distress. She has no wheezes. She has no rales. She exhibits no tenderness.  Abdominal: Soft. There is no tenderness. There is no guarding.  Musculoskeletal: Normal range of motion.  Lymphadenopathy:    She has no  cervical adenopathy.  Neurological: She is alert and oriented to person, place, and time. No cranial nerve deficit.  Skin: Skin is warm and dry. She is not diaphoretic.  Psychiatric: She has a normal mood and affect. Her behavior is normal. Judgment and thought content normal.  Nursing note and vitals reviewed.  Assessment/Plan: 1. Essential (primary) hypertension Controlled at this time with lisinopril. Continue medications as directed. Hypertension Counseling:   The following hypertensive lifestyle modification were recommended and discussed:  1. Limiting alcohol intake to less than 1 oz/day of ethanol:(24 oz of beer or 8 oz of wine or 2 oz of 100-proof whiskey). 2. Take baby ASA 81 mg daily. 3. Importance of regular aerobic exercise and losing weight. 4. Reduce dietary saturated fat and cholesterol intake for overall cardiovascular health. 5. Maintaining adequate dietary potassium, calcium, and magnesium intake. 6. Regular monitoring of the blood pressure. 7. Reduce sodium intake to less than  100 mmol/day (less than 2.3 gm of sodium or less than 6 gm of sodium choride)   2. Hyperlipidemia, unspecified hyperlipidemia type Will draw cholesterol panel.    3. Morbid obesity ([place]) Obesity Counseling: Risk Assessment: An assessment of behavioral risk factors was made today and includes lack of exercise sedentary lifestyle, lack of portion control and poor dietary habits.  Risk Modification Advice: She was counseled on portion control guidelines. Restricting daily caloric intake to. . The detrimental long term effects of obesity on her health and ongoing poor compliance was also discussed with the patient.  4. Depression, unspecified depression type Controlled with zoloft  5. Generalized anxiety disorder Controlled with zoloft and xanax.  6. Primary localized osteoarthrosis Refilled tramadol. - traMADol (ULTRAM) 50 MG tablet; Take 1 tablet (50 mg total) by mouth 2 (two) times daily.   Dispense: 60 tablet; Refill: 1  General Counseling: Naomii verbalizes understanding of the findings of todays visit and agrees with plan of treatment. I have discussed any further diagnostic evaluation that may be needed or ordered today. We also reviewed her medications today. she has been encouraged to call the office with any questions or concerns that should arise related to todays visit.   Meds ordered this encounter  Medications  . traMADol (ULTRAM) 50 MG tablet    Sig: Take 1 tablet (50 mg total) by mouth 2 (two) times daily.    Dispense:  60 tablet    Refill:  1    Time spent: 25 Minutes   This patient was seen by Orson Gear AGNP-C in Collaboration with Dr Lavera Guise as a part of collaborative care agreement    Dr Lavera Guise Internal medicine

## 2018-03-02 ENCOUNTER — Other Ambulatory Visit: Payer: Self-pay | Admitting: Internal Medicine

## 2018-03-03 ENCOUNTER — Other Ambulatory Visit: Payer: Self-pay

## 2018-03-03 MED ORDER — BENAZEPRIL-HYDROCHLOROTHIAZIDE 10-12.5 MG PO TABS: 1.0000 | ORAL_TABLET | Freq: Every day | ORAL | 1 refills | Status: DC

## 2018-03-04 ENCOUNTER — Ambulatory Visit: Payer: Self-pay | Admitting: Internal Medicine

## 2018-03-20 ENCOUNTER — Other Ambulatory Visit (HOSPITAL_COMMUNITY)
Admission: RE | Admit: 2018-03-20 | Discharge: 2018-03-20 | Disposition: A | Discharge status: Home / Self Care | Payer: BLUE CROSS/BLUE SHIELD | Source: Ambulatory Visit | Attending: Obstetrics and Gynecology | Admitting: Obstetrics and Gynecology

## 2018-03-20 ENCOUNTER — Encounter: Payer: Self-pay | Admitting: Obstetrics and Gynecology

## 2018-03-20 ENCOUNTER — Other Ambulatory Visit: Payer: Self-pay

## 2018-03-20 ENCOUNTER — Ambulatory Visit (INDEPENDENT_AMBULATORY_CARE_PROVIDER_SITE_OTHER): Payer: BLUE CROSS/BLUE SHIELD | Admitting: Obstetrics and Gynecology

## 2018-03-20 VITALS — BP 130/80 | HR 84 | Ht 63.0 in | Wt 231.0 lb

## 2018-03-20 DIAGNOSIS — Z01411 Encounter for gynecological examination (general) (routine) with abnormal findings: Secondary | ICD-10-CM | POA: Diagnosis not present

## 2018-03-20 DIAGNOSIS — E1165 Type 2 diabetes mellitus with hyperglycemia: Secondary | ICD-10-CM

## 2018-03-20 DIAGNOSIS — Z1211 Encounter for screening for malignant neoplasm of colon: Secondary | ICD-10-CM

## 2018-03-20 DIAGNOSIS — I10 Essential (primary) hypertension: Secondary | ICD-10-CM

## 2018-03-20 DIAGNOSIS — Z1239 Encounter for other screening for malignant neoplasm of breast: Secondary | ICD-10-CM

## 2018-03-20 DIAGNOSIS — Z01419 Encounter for gynecological examination (general) (routine) without abnormal findings: Secondary | ICD-10-CM

## 2018-03-20 DIAGNOSIS — Z124 Encounter for screening for malignant neoplasm of cervix: Secondary | ICD-10-CM

## 2018-03-20 DIAGNOSIS — Z1231 Encounter for screening mammogram for malignant neoplasm of breast: Secondary | ICD-10-CM

## 2018-03-20 MED ORDER — PRASTERONE 6.5 MG VA INST: 6.5000 mg | VAGINAL_INSERT | Freq: Every day | VAGINAL | 11 refills | Status: DC

## 2018-03-20 NOTE — Patient Instructions (Addendum)
[facility] [place] [address]  [facility]  [address] [place] [address]  Phone: [phone]   Preventive Care 40-64 Years, Female Preventive care refers to lifestyle choices and visits with your health care provider that can promote health and wellness. What does preventive care include?  A yearly physical exam. This is also called an annual well check.  Dental exams once or twice a year.  Routine eye exams. Ask your health care provider how often you should have your eyes checked.  Personal lifestyle choices, including: ? Daily care of your teeth and gums. ? Regular physical activity. ? Eating a healthy diet. ? Avoiding tobacco and drug use. ? Limiting alcohol use. ? Practicing safe sex. ? Taking low-dose aspirin daily starting at age 36. ? Taking vitamin and mineral supplements as recommended by your health care provider. What happens during an annual well check? The services and screenings done by your health care provider during your annual well check will depend on your age, overall health, lifestyle risk factors, and family history of disease. Counseling Your health care provider may ask you questions about your:  Alcohol use.  Tobacco use.  Drug use.  Emotional well-being.  Home and relationship well-being.  Sexual activity.  Eating habits.  Work and work Statistician.  Method of birth control.  Menstrual cycle.  Pregnancy history.  Screening You may have the following tests or measurements:  Height, weight, and BMI.  Blood pressure.  Lipid and cholesterol levels. These may be checked every 5 years, or more frequently if you are over 41 years old.  Skin check.  Lung cancer screening. You may have this screening every year starting at age 44 if you have a 30-pack-year history of smoking and currently smoke or have quit within the past 15 years.  Fecal occult blood test (FOBT) of the  stool. You may have this test every year starting at age 91.  Flexible sigmoidoscopy or colonoscopy. You may have a sigmoidoscopy every 5 years or a colonoscopy every 10 years starting at age 11.  Hepatitis C blood test.  Hepatitis B blood test.  Sexually transmitted disease (STD) testing.  Diabetes screening. This is done by checking your blood sugar (glucose) after you have not eaten for a while (fasting). You may have this done every 1-3 years.  Mammogram. This may be done every 1-2 years. Talk to your health care provider about when you should start having regular mammograms. This may depend on whether you have a family history of breast cancer.  BRCA-related cancer screening. This may be done if you have a family history of breast, ovarian, tubal, or peritoneal cancers.  Pelvic exam and Pap test. This may be done every 3 years starting at age 12. Starting at age 78, this may be done every 5 years if you have a Pap test in combination with an HPV test.  Bone density scan. This is done to screen for osteoporosis. You may have this scan if you are at high risk for osteoporosis.  Discuss your test results, treatment options, and if necessary, the need for more tests with your health care provider. Vaccines Your health care provider may recommend certain vaccines, such as:  Influenza vaccine. This is recommended every year.  Tetanus, diphtheria, and acellular pertussis (Tdap, Td) vaccine. You may need a Td booster every 10 years.  Varicella vaccine. You may need this if you have not been vaccinated.  Zoster vaccine. You  may need this after age 46.  Measles, mumps, and rubella (MMR) vaccine. You may need at least one dose of MMR if you were born in 1957 or later. You may also need a second dose.  Pneumococcal 13-valent conjugate (PCV13) vaccine. You may need this if you have certain conditions and were not previously vaccinated.  Pneumococcal polysaccharide (PPSV23) vaccine. You  may need one or two doses if you smoke cigarettes or if you have certain conditions.  Meningococcal vaccine. You may need this if you have certain conditions.  Hepatitis A vaccine. You may need this if you have certain conditions or if you travel or work in places where you may be exposed to hepatitis A.  Hepatitis B vaccine. You may need this if you have certain conditions or if you travel or work in places where you may be exposed to hepatitis B.  Haemophilus influenzae type b (Hib) vaccine. You may need this if you have certain conditions.  Talk to your health care provider about which screenings and vaccines you need and how often you need them. This information is not intended to replace advice given to you by your health care provider. Make sure you discuss any questions you have with your health care provider. Document Released: 07/08/2015 Document Revised: 02/29/2016 Document Reviewed: 04/12/2015 Elsevier Interactive Patient Education  [employer].

## 2018-03-20 NOTE — Progress Notes (Signed)
Gynecology Annual Exam  PCP: Lavera Guise, MD  Chief Complaint:  Chief Complaint  Patient presents with  . Gynecologic Exam    painful intercourse    History of Present Illness:Patient is a 57 y.o. G2P1011 presents for annual exam. The patient has no complaints today.   LMP: No LMP recorded. Patient is postmenopausal.  The patient is sexually active. She has dyspareunia.  The patient does perform self breast exams.  There is no notable family history of breast or ovarian cancer in her family.  The patient wears seatbelts: yes.   The patient has regular exercise: not asked.    The patient reports current symptoms of depression.   Being followed and on Zoloft and Xanax.  Review of Systems: Review of Systems  Constitutional: Negative for chills and fever.  HENT: Negative for congestion.   Respiratory: Negative for cough and shortness of breath.   Cardiovascular: Negative for chest pain and palpitations.  Gastrointestinal: Negative for abdominal pain, constipation, diarrhea, heartburn, nausea and vomiting.  Genitourinary: Negative for dysuria, frequency and urgency.  Skin: Negative for itching and rash.  Neurological: Negative for dizziness and headaches.  Endo/Heme/Allergies: Negative for polydipsia.  Psychiatric/Behavioral: Positive for depression. Negative for hallucinations, memory loss, substance abuse and suicidal ideas. The patient is nervous/anxious. The patient does not have insomnia.     Past Medical History:  Past Medical History:  Diagnosis Date  . Anxiety   . Asthma   . Depression   . Environmental allergies   . Hyperlipidemia   . Hypertension   . Rheumatoid arthritis ([place])   . Sleep apnea 2019   Borderline  . Type 2 diabetes mellitus ([place])     Past Surgical History:  Past Surgical History:  Procedure Laterality Date  . CHOLECYSTECTOMY  2000  . colon polyectomy    . FOOT SURGERY      Gynecologic History:  No LMP recorded. Patient is  postmenopausal. Last Pap: Results were: 10/14/2014 NIL and HR HPV negative  Last mammogram: 10/14/2014 Results were: BI-RAD I  GYN Ultrasound 08/15/2015 Posterior intramural fibroid 20.44 x 22.10 x 20.38mm, anterior left intramural 20.06 x 21.65 x 24.85mm with EMS of 3.35mm  Obstetric History: G2P1011  Family History:  Family History  Problem Relation Age of Onset  . Diabetes Mother   . COPD Mother   . Uterine cancer Mother   . Cancer Sister 33       Bile Duct    Social History:  Social History   Socioeconomic History  . Marital status: Married    Spouse name: Not on file  . Number of children: Not on file  . Years of education: Not on file  . Highest education level: Not on file  Occupational History  . Not on file  Social Needs  . Financial resource strain: Not on file  . Food insecurity:    Worry: Not on file    Inability: Not on file  . Transportation needs:    Medical: Not on file    Non-medical: Not on file  Tobacco Use  . Smoking status: Never Smoker  . Smokeless tobacco: Never Used  Substance and Sexual Activity  . Alcohol use: Yes  . Drug use: No  . Sexual activity: Yes  Lifestyle  . Physical activity:    Days per week: Not on file    Minutes per session: Not on file  . Stress: Not on file  Relationships  . Social connections:  Talks on phone: Not on file    Gets together: Not on file    Attends religious service: Not on file    Active member of club or organization: Not on file    Attends meetings of clubs or organizations: Not on file    Relationship status: Not on file  . Intimate partner violence:    Fear of current or ex partner: Not on file    Emotionally abused: Not on file    Physically abused: Not on file    Forced sexual activity: Not on file  Other Topics Concern  . Not on file  Social History Narrative  . Not on file    Allergies:  Allergies  Allergen Reactions  . Azithromycin Hives  . Penicillin G Rash     Medications: Prior to Admission medications   Medication Sig Start Date End Date Taking? Authorizing Provider  ALPRAZolam Duanne Moron) 0.5 MG tablet Take half to one tab aday prn 08/28/17   Lavera Guise, MD  benazepril-hydrochlorthiazide (LOTENSIN HCT) 10-12.5 MG tablet Take 1 tablet by mouth daily. 03/03/18   Kendell Bane, NP  omeprazole (PRILOSEC) 40 MG capsule TAKE ONE CAPSULE BY MOUTH EVERY MORNING THIRTY MINUTES PRIOR TO FOOD OR MEDICATIONS 02/26/18   Lavera Guise, MD  sertraline (ZOLOFT) 100 MG tablet Take 1 tablet (100 mg total) by mouth daily. 02/13/18   Lavera Guise, MD  traMADol (ULTRAM) 50 MG tablet Take 1 tablet (50 mg total) by mouth 2 (two) times daily. 02/28/18   Kendell Bane, NP    Physical Exam Vitals: Blood pressure 130/80, pulse 84, height 5\' 3"  (1.6 m), weight 231 lb (104.8 kg).  General: NAD HEENT: normocephalic, anicteric Thyroid: no enlargement, no palpable nodules Pulmonary: No increased work of breathing, CTAB Cardiovascular: RRR, distal pulses 2+ Breast: Breast symmetrical, no tenderness, no palpable nodules or masses, no skin or nipple retraction present, no nipple discharge.  No axillary or supraclavicular lymphadenopathy. Abdomen: NABS, soft, non-tender, non-distended.  Umbilicus without lesions.  No hepatomegaly, splenomegaly or masses palpable. No evidence of hernia  Genitourinary:  External: Normal external female genitalia.  Normal urethral meatus, normal Bartholin's and Skene's glands.    Vagina: Normal vaginal mucosa, no evidence of prolapse.    Cervix: Grossly normal in appearance, no bleeding  Uterus: Non-enlarged, mobile, normal contour.  No CMT  Adnexa: ovaries non-enlarged, no adnexal masses  Rectal: deferred  Lymphatic: no evidence of inguinal lymphadenopathy Extremities: no edema, erythema, or tenderness Neurologic: Grossly intact Psychiatric: mood appropriate, affect full  Female chaperone present for pelvic and breast  portions of the  physical exam     Assessment: 57 y.o. G2P1011 routine annual exam  Plan: Problem List Items Addressed This Visit      Cardiovascular and Mediastinum   Essential (primary) hypertension     Endocrine   Type 2 diabetes mellitus with hyperglycemia ([place])    Other Visit Diagnoses    Encounter for gynecological examination without abnormal finding    -  Primary   Screening for malignant neoplasm of cervix       Relevant Orders   Cytology - PAP   Breast screening       Relevant Orders   MM 3D SCREEN BREAST BILATERAL   Colon cancer screening       Relevant Orders   Ambulatory referral to Gastroenterology      1) Mammogram - recommend yearly screening mammogram.  Mammogram Was ordered today  2) STI screening  was  notoffered and therefore not obtained  3) ASCCP guidelines and rational discussed.  Patient opts for every 3 years screening interval  4) Osteoporosis  - per USPTF routine screening DEXA at age 57  5) 67 - rx intrarosa   63) Colonoscopy - 07/14/2010 Sankar repeat recommended 2017  7) Return in about 3 months (around 06/19/2018) for medication follow up.    Malachy Mood, MD [place] Pigeon, [facility] 03/20/2018, 9:27 AM

## 2018-03-21 DIAGNOSIS — M25522 Pain in left elbow: Secondary | ICD-10-CM | POA: Diagnosis not present

## 2018-03-21 DIAGNOSIS — R079 Chest pain, unspecified: Secondary | ICD-10-CM | POA: Diagnosis not present

## 2018-03-21 DIAGNOSIS — M25512 Pain in left shoulder: Secondary | ICD-10-CM | POA: Diagnosis not present

## 2018-03-21 DIAGNOSIS — M62830 Muscle spasm of back: Secondary | ICD-10-CM | POA: Diagnosis not present

## 2018-03-21 DIAGNOSIS — Z6838 Body mass index (BMI) 38.0-38.9, adult: Secondary | ICD-10-CM | POA: Diagnosis not present

## 2018-03-21 DIAGNOSIS — I10 Essential (primary) hypertension: Secondary | ICD-10-CM | POA: Diagnosis not present

## 2018-03-21 DIAGNOSIS — Z79899 Other long term (current) drug therapy: Secondary | ICD-10-CM | POA: Diagnosis not present

## 2018-03-24 LAB — CYTOLOGY - PAP
DIAGNOSIS: NEGATIVE
HPV: NOT DETECTED

## 2018-03-25 ENCOUNTER — Encounter: Payer: Self-pay | Admitting: Internal Medicine

## 2018-03-25 ENCOUNTER — Telehealth: Payer: Self-pay

## 2018-03-25 NOTE — Progress Notes (Signed)
SCANNED IN CPAP TITRATION REPORT DONE ON 12/17/17

## 2018-03-25 NOTE — Telephone Encounter (Signed)
[place] Patient orders for CPAP and once benefits are checked pt will need to call and schedule in clinic set up.Beth

## 2018-03-28 ENCOUNTER — Encounter: Payer: Self-pay | Admitting: Internal Medicine

## 2018-03-28 NOTE — Progress Notes (Signed)
SCANNED IN DME ORDER FOR CPAP ORDERED ON 01/07/18.

## 2018-03-31 ENCOUNTER — Encounter
Admission: RE | Disposition: A | Discharge status: Home / Self Care | Payer: Self-pay | Source: Ambulatory Visit | Attending: Gastroenterology

## 2018-03-31 ENCOUNTER — Ambulatory Visit: Payer: BLUE CROSS/BLUE SHIELD | Admitting: Anesthesiology

## 2018-03-31 ENCOUNTER — Ambulatory Visit
Admission: RE | Admit: 2018-03-31 | Discharge: 2018-03-31 | Disposition: A | Discharge status: Home / Self Care | Payer: BLUE CROSS/BLUE SHIELD | Source: Ambulatory Visit | Attending: Gastroenterology | Admitting: Gastroenterology

## 2018-03-31 ENCOUNTER — Encounter: Payer: Self-pay | Admitting: Gastroenterology

## 2018-03-31 DIAGNOSIS — F419 Anxiety disorder, unspecified: Secondary | ICD-10-CM | POA: Insufficient documentation

## 2018-03-31 DIAGNOSIS — E119 Type 2 diabetes mellitus without complications: Secondary | ICD-10-CM | POA: Insufficient documentation

## 2018-03-31 DIAGNOSIS — F329 Major depressive disorder, single episode, unspecified: Secondary | ICD-10-CM | POA: Insufficient documentation

## 2018-03-31 DIAGNOSIS — I10 Essential (primary) hypertension: Secondary | ICD-10-CM | POA: Insufficient documentation

## 2018-03-31 DIAGNOSIS — Z79899 Other long term (current) drug therapy: Secondary | ICD-10-CM | POA: Insufficient documentation

## 2018-03-31 DIAGNOSIS — Z86010 Personal history of colonic polyps: Secondary | ICD-10-CM | POA: Diagnosis not present

## 2018-03-31 DIAGNOSIS — M069 Rheumatoid arthritis, unspecified: Secondary | ICD-10-CM | POA: Diagnosis not present

## 2018-03-31 DIAGNOSIS — E785 Hyperlipidemia, unspecified: Secondary | ICD-10-CM | POA: Insufficient documentation

## 2018-03-31 DIAGNOSIS — D122 Benign neoplasm of ascending colon: Secondary | ICD-10-CM | POA: Insufficient documentation

## 2018-03-31 DIAGNOSIS — K635 Polyp of colon: Secondary | ICD-10-CM | POA: Diagnosis not present

## 2018-03-31 DIAGNOSIS — K5730 Diverticulosis of large intestine without perforation or abscess without bleeding: Secondary | ICD-10-CM | POA: Diagnosis not present

## 2018-03-31 DIAGNOSIS — Z1211 Encounter for screening for malignant neoplasm of colon: Secondary | ICD-10-CM | POA: Diagnosis not present

## 2018-03-31 DIAGNOSIS — D124 Benign neoplasm of descending colon: Secondary | ICD-10-CM | POA: Insufficient documentation

## 2018-03-31 DIAGNOSIS — D126 Benign neoplasm of colon, unspecified: Secondary | ICD-10-CM | POA: Diagnosis not present

## 2018-03-31 DIAGNOSIS — E1165 Type 2 diabetes mellitus with hyperglycemia: Secondary | ICD-10-CM | POA: Diagnosis not present

## 2018-03-31 DIAGNOSIS — J45909 Unspecified asthma, uncomplicated: Secondary | ICD-10-CM | POA: Insufficient documentation

## 2018-03-31 HISTORY — PX: COLONOSCOPY WITH PROPOFOL: SHX5780

## 2018-03-31 SURGERY — COLONOSCOPY WITH PROPOFOL
Anesthesia: General

## 2018-03-31 MED ORDER — SODIUM CHLORIDE 0.9 % IV SOLN
INTRAVENOUS | Status: DC
  Administered 2018-03-31: 1000 mL via INTRAVENOUS

## 2018-03-31 MED ORDER — PHENYLEPHRINE HCL 10 MG/ML IJ SOLN
INTRAMUSCULAR | Status: DC | PRN
  Administered 2018-03-31: 100 ug via INTRAVENOUS

## 2018-03-31 MED ORDER — PROPOFOL 10 MG/ML IV BOLUS
INTRAVENOUS | Status: DC | PRN
  Administered 2018-03-31: 70 mg via INTRAVENOUS

## 2018-03-31 MED ORDER — PROPOFOL 500 MG/50ML IV EMUL
INTRAVENOUS | Status: DC | PRN
  Administered 2018-03-31: 150 ug/kg/min via INTRAVENOUS

## 2018-03-31 NOTE — Anesthesia Procedure Notes (Signed)
Date/Time: 03/31/2018 11:14 AM Performed by: Nelda Marseille, CRNA Pre-anesthesia Checklist: Patient identified, Emergency Drugs available, Suction available, Patient being monitored and Timeout performed Oxygen Delivery Method: Nasal cannula

## 2018-03-31 NOTE — Transfer of Care (Signed)
Immediate Anesthesia Transfer of Care Note  Patient: MYKAL KIRCHMAN  Procedure(s) Performed: COLONOSCOPY WITH PROPOFOL (N/A )  Patient Location: PACU  Anesthesia Type:General  Level of Consciousness: sedated  Airway & Oxygen Therapy: Patient Spontanous Breathing and Patient connected to nasal cannula oxygen  Post-op Assessment: Report given to RN and Post -op Vital signs reviewed and stable  Post vital signs: Reviewed and stable  Last Vitals:  Vitals Value Taken Time  BP 136/75 03/31/2018 11:43 AM  Temp 36 C 03/31/2018 11:42 AM  Pulse 57 03/31/2018 11:44 AM  Resp 14 03/31/2018 11:44 AM  SpO2 100 % 03/31/2018 11:44 AM  Vitals shown include unvalidated device data.  Last Pain:  Vitals:   03/31/18 1142  TempSrc: Tympanic  PainSc: 0-No pain         Complications: No apparent anesthesia complications

## 2018-03-31 NOTE — Anesthesia Preprocedure Evaluation (Addendum)
Anesthesia Evaluation  Patient identified by MRN, date of birth, ID band Patient awake    Reviewed: Allergy & Precautions, H&P , NPO status , Patient's Chart, lab work & pertinent test results  Airway Mallampati: I  TM Distance: >3 FB Neck ROM: full    Dental  (+) Chipped   Pulmonary asthma , sleep apnea ,           Cardiovascular hypertension,      Neuro/Psych PSYCHIATRIC DISORDERS Anxiety Depression negative neurological ROS  negative psych ROS   GI/Hepatic negative GI ROS, Neg liver ROS,   Endo/Other  diabetes  Renal/GU negative Renal ROS  negative genitourinary   Musculoskeletal  (+) Arthritis ,   Abdominal   Peds  Hematology negative hematology ROS (+)   Anesthesia Other Findings Past Medical History: No date: Anxiety No date: Asthma No date: Depression No date: Environmental allergies No date: Hyperlipidemia No date: Hypertension No date: Rheumatoid arthritis ([place]) 2019: Sleep apnea     Comment:  Borderline No date: Type 2 diabetes mellitus ([place])  Past Surgical History: 2000: CHOLECYSTECTOMY No date: colon polyectomy No date: FOOT SURGERY  BMI    Body Mass Index:  40.74 kg/m      Reproductive/Obstetrics negative OB ROS                            Anesthesia Physical Anesthesia Plan  ASA: III  Anesthesia Plan: General   Post-op Pain Management:    Induction:   PONV Risk Score and Plan: Propofol infusion and TIVA  Airway Management Planned:   Additional Equipment:   Intra-op Plan:   Post-operative Plan:   Informed Consent: I have reviewed the patients History and Physical, chart, labs and discussed the procedure including the risks, benefits and alternatives for the proposed anesthesia with the patient or authorized representative who has indicated his/her understanding and acceptance.   Dental Advisory Given  Plan Discussed with: Anesthesiologist, CRNA  and Surgeon  Anesthesia Plan Comments:         Anesthesia Quick Evaluation

## 2018-03-31 NOTE — Anesthesia Post-op Follow-up Note (Signed)
Anesthesia QCDR form completed.        

## 2018-03-31 NOTE — H&P (Signed)
Kristen Bellows, MD [address] [place], [place], [place], [id number] [id number], [place], [place], [place], [address] Phone: [phone]  Fax: [phone]  Primary Care Physician:  Lavera Guise, MD   Pre-Procedure History & Physical: HPI:  Kristen Sparks is a 57 y.o. female is here for an colonoscopy.   Past Medical History:  Diagnosis Date  . Anxiety   . Asthma   . Depression   . Environmental allergies   . Hyperlipidemia   . Hypertension   . Rheumatoid arthritis ([place])   . Sleep apnea 2019   Borderline  . Type 2 diabetes mellitus ([place])     Past Surgical History:  Procedure Laterality Date  . CHOLECYSTECTOMY  2000  . colon polyectomy    . FOOT SURGERY      Prior to Admission medications   Medication Sig Start Date End Date Taking? Authorizing Provider  ALPRAZolam Duanne Moron) 0.5 MG tablet Take half to one tab aday prn 08/28/17  Yes Lavera Guise, MD  benazepril-hydrochlorthiazide (LOTENSIN HCT) 10-12.5 MG tablet Take 1 tablet by mouth daily. 03/03/18  Yes [place], Audie Clear, NP  omeprazole (PRILOSEC) 40 MG capsule TAKE ONE CAPSULE BY MOUTH EVERY MORNING THIRTY MINUTES PRIOR TO FOOD OR MEDICATIONS 02/26/18  Yes Lavera Guise, MD  Prasterone (INTRAROSA) 6.5 MG INST Place 6.5 mg vaginally at bedtime. 03/20/18  Yes Malachy Mood, MD  sertraline (ZOLOFT) 100 MG tablet Take 1 tablet (100 mg total) by mouth daily. 02/13/18  Yes Lavera Guise, MD  traMADol (ULTRAM) 50 MG tablet Take 1 tablet (50 mg total) by mouth 2 (two) times daily. 02/28/18  Yes Kendell Bane, NP    Allergies as of 03/20/2018 - Review Complete 03/20/2018  Allergen Reaction Noted  . Azithromycin Hives 08/08/2016  . Penicillin g Rash 11/28/2016    Family History  Problem Relation Age of Onset  . Diabetes Mother   . COPD Mother   . Uterine cancer Mother   . Cancer Sister 58       Bile Duct    Social History   Socioeconomic History  . Marital status: Married    Spouse name: Not on file  . Number  of children: Not on file  . Years of education: Not on file  . Highest education level: Not on file  Occupational History  . Not on file  Social Needs  . Financial resource strain: Not on file  . Food insecurity:    Worry: Not on file    Inability: Not on file  . Transportation needs:    Medical: Not on file    Non-medical: Not on file  Tobacco Use  . Smoking status: Never Smoker  . Smokeless tobacco: Never Used  Substance and Sexual Activity  . Alcohol use: Yes  . Drug use: No  . Sexual activity: Yes  Lifestyle  . Physical activity:    Days per week: Not on file    Minutes per session: Not on file  . Stress: Not on file  Relationships  . Social connections:    Talks on phone: Not on file    Gets together: Not on file    Attends religious service: Not on file    Active member of club or organization: Not on file    Attends meetings of clubs or organizations: Not on file    Relationship status: Not on file  . Intimate partner violence:    Fear of current or ex partner:  Not on file    Emotionally abused: Not on file    Physically abused: Not on file    Forced sexual activity: Not on file  Other Topics Concern  . Not on file  Social History Narrative  . Not on file    Review of Systems: See HPI, otherwise negative ROS  Physical Exam: BP (!) 144/84   Pulse 71   Temp (!) 96.7 F (35.9 C) (Tympanic)   Resp 20   Ht 5\' 3"  (1.6 m)   Wt 104.3 kg   SpO2 99%   BMI 40.74 kg/m  General:   Alert,  pleasant and cooperative in NAD Head:  Normocephalic and atraumatic. Neck:  Supple; no masses or thyromegaly. Lungs:  Clear throughout to auscultation, normal respiratory effort.    Heart:  +S1, +S2, Regular rate and rhythm, No edema. Abdomen:  Soft, nontender and nondistended. Normal bowel sounds, without guarding, and without rebound.   Neurologic:  Alert and  oriented x4;  grossly normal neurologically.  Impression/Plan: Kristen Sparks is here for an colonoscopy to be  performed for surveillance due to prior history of colon polyps   Risks, benefits, limitations, and alternatives regarding  colonoscopy have been reviewed with the patient.  Questions have been answered.  All parties agreeable.   Kristen Bellows, MD  03/31/2018, 10:57 AM

## 2018-03-31 NOTE — Op Note (Signed)
[facility] Patient Name: Kristen Sparks Procedure Date: 03/31/2018 10:53 AM MRN: [id number] Account #: [id number] Date of Birth: 1961/04/23 Admit Type: Outpatient Age: 57 Room: [facility] ENDO ROOM 2 Gender: Female Note Status: Finalized Procedure:            Colonoscopy Indications:          High risk colon cancer surveillance: Personal history                        of colonic polyps Providers:            Jonathon Bellows MD, MD Referring MD:         Lavera Guise, MD (Referring MD) Medicines:            Monitored Anesthesia Care Complications:        No immediate complications. Procedure:            Pre-Anesthesia Assessment:                       - Prior to the procedure, a History and Physical was                        performed, and patient medications, allergies and                        sensitivities were reviewed. The patient's tolerance of                        previous anesthesia was reviewed.                       - The risks and benefits of the procedure and the                        sedation options and risks were discussed with the                        patient. All questions were answered and informed                        consent was obtained.                       - ASA Grade Assessment: II - A patient with mild                        systemic disease.                       After obtaining informed consent, the colonoscope was                        passed under direct vision. Throughout the procedure,                        the patient's blood pressure, pulse, and oxygen                        saturations were monitored continuously. The  Colonoscope was introduced through the anus and                        advanced to the the cecum, identified by the                        appendiceal orifice, IC valve and transillumination.                        The colonoscopy was performed with ease. The patient              tolerated the procedure well. The quality of the bowel                        preparation was good. Findings:      The perianal and digital rectal examinations were normal.      Multiple small-mouthed diverticula were found in the sigmoid colon.      A 3 mm polyp was found in the ascending colon. The polyp was sessile.       The polyp was removed with a cold biopsy forceps. Resection and       retrieval were complete.      Two sessile polyps were found in the ascending colon. The polyps were 4       to 6 mm in size. These polyps were removed with a cold snare. Resection       and retrieval were complete.      A 5 mm polyp was found in the descending colon. The polyp was sessile.       The polyp was removed with a cold snare. Resection and retrieval were       complete.      The exam was otherwise without abnormality on direct and retroflexion       views. Impression:           - Diverticulosis in the sigmoid colon.                       - One 3 mm polyp in the ascending colon, removed with a                        cold biopsy forceps. Resected and retrieved.                       - Two 4 to 6 mm polyps in the ascending colon, removed                        with a cold snare. Resected and retrieved.                       - One 5 mm polyp in the descending colon, removed with                        a cold snare. Resected and retrieved.                       - The examination was otherwise normal on direct and                        retroflexion views. Recommendation:       -  Discharge patient to home (with escort).                       - Resume previous diet.                       - Continue present medications.                       - Await pathology results.                       - Repeat colonoscopy in 3 - 5 years for surveillance                        based on pathology results. Procedure Code(s):    --- Professional ---                       [phone], Colonoscopy,  flexible; with removal of tumor(s),                        polyp(s), or other lesion(s) by snare technique                       45380, 25, Colonoscopy, flexible; with biopsy, single                        or multiple Diagnosis Code(s):    --- Professional ---                       Z86.010, Personal history of colonic polyps                       D12.2, Benign neoplasm of ascending colon                       D12.4, Benign neoplasm of descending colon                       K57.30, Diverticulosis of large intestine without                        perforation or abscess without bleeding CPT copyright 2017 American Medical Association. All rights reserved. The codes documented in this report are preliminary and upon coder review may  be revised to meet current compliance requirements. Jonathon Bellows, MD Jonathon Bellows MD, MD 03/31/2018 11:41:26 AM This report has been signed electronically. Number of Addenda: 0 Note Initiated On: 03/31/2018 10:53 AM Scope Withdrawal Time: 0 hours 22 minutes 32 seconds  Total Procedure Duration: 0 hours 24 minutes 55 seconds       [facility]

## 2018-04-02 LAB — SURGICAL PATHOLOGY

## 2018-04-02 NOTE — Anesthesia Postprocedure Evaluation (Signed)
Anesthesia Post Note  Patient: Kristen Sparks  Procedure(s) Performed: COLONOSCOPY WITH PROPOFOL (N/A )  Patient location during evaluation: PACU Anesthesia Type: General Level of consciousness: awake and alert Pain management: pain level controlled Vital Signs Assessment: post-procedure vital signs reviewed and stable Respiratory status: spontaneous breathing, nonlabored ventilation and respiratory function stable Cardiovascular status: blood pressure returned to baseline and stable Postop Assessment: no apparent nausea or vomiting Anesthetic complications: no     Last Vitals:  Vitals:   03/31/18 1152 03/31/18 1202  BP: 128/68 134/89  Pulse: 62 62  Resp: 15 15  Temp:    SpO2: 100% 95%    Last Pain:  Vitals:   04/01/18 0820  TempSrc:   PainSc: 0-No pain                 Durenda Hurt

## 2018-04-04 ENCOUNTER — Encounter: Payer: Self-pay | Admitting: Gastroenterology

## 2018-04-08 ENCOUNTER — Telehealth: Payer: Self-pay

## 2018-04-08 NOTE — Telephone Encounter (Signed)
[employer] has been trying to reach patient regarding cpap benefits for machine, if pt calls back she needs to call (beth at [facility]). Beth

## 2018-04-30 ENCOUNTER — Other Ambulatory Visit: Payer: Self-pay

## 2018-05-02 ENCOUNTER — Ambulatory Visit: Payer: BLUE CROSS/BLUE SHIELD | Admitting: Adult Health

## 2018-05-02 ENCOUNTER — Encounter: Payer: Self-pay | Admitting: Adult Health

## 2018-05-02 VITALS — BP 120/72 | HR 80 | Resp 16 | Ht 63.0 in | Wt 231.0 lb

## 2018-05-02 DIAGNOSIS — M1991 Primary osteoarthritis, unspecified site: Secondary | ICD-10-CM

## 2018-05-02 DIAGNOSIS — I10 Essential (primary) hypertension: Secondary | ICD-10-CM | POA: Diagnosis not present

## 2018-05-02 DIAGNOSIS — E785 Hyperlipidemia, unspecified: Secondary | ICD-10-CM

## 2018-05-02 DIAGNOSIS — F329 Major depressive disorder, single episode, unspecified: Secondary | ICD-10-CM

## 2018-05-02 DIAGNOSIS — F32A Depression, unspecified: Secondary | ICD-10-CM

## 2018-05-02 MED ORDER — TRAMADOL HCL 50 MG PO TABS: 50.0000 mg | ORAL_TABLET | Freq: Two times a day (BID) | ORAL | 2 refills | Status: DC

## 2018-05-02 NOTE — Progress Notes (Signed)
[facility] [place], [place] [address]  Internal MEDICINE  Office Visit Note  Patient Name: Kristen Sparks  [id number]  [id number]  Date of Service: 05/02/2018  Chief Complaint  Patient presents with  . Hypertension  . Neck Pain    needs refills   . Hyperlipidemia  . Depression    HPI  Patient is here for follow-up on hypertension, hyperlipidemia, depression, and chronic osteoarthritis pain.  She reports her blood pressure has been well controlled.  She also reports her addiction and hyperlipidemia are stable at this time.  She will continue her current medications until told otherwise.  She needs a refill her tramadol for her osteoarthritis pain.   Current Medication: Outpatient Encounter Medications as of 05/02/2018  Medication Sig  . ALPRAZolam (XANAX) 0.5 MG tablet Take half to one tab aday prn  . benazepril-hydrochlorthiazide (LOTENSIN HCT) 10-12.5 MG tablet Take 1 tablet by mouth daily.  Marland Kitchen omeprazole (PRILOSEC) 40 MG capsule TAKE ONE CAPSULE BY MOUTH EVERY MORNING THIRTY MINUTES PRIOR TO FOOD OR MEDICATIONS  . Prasterone (INTRAROSA) 6.5 MG INST Place 6.5 mg vaginally at bedtime.  . sertraline (ZOLOFT) 100 MG tablet Take 1 tablet (100 mg total) by mouth daily.  . traMADol (ULTRAM) 50 MG tablet Take 1 tablet (50 mg total) by mouth 2 (two) times daily.  . [DISCONTINUED] traMADol (ULTRAM) 50 MG tablet Take 1 tablet (50 mg total) by mouth 2 (two) times daily.   No facility-administered encounter medications on file as of 05/02/2018.     Surgical History: Past Surgical History:  Procedure Laterality Date  . CHOLECYSTECTOMY  2000  . colon polyectomy    . COLONOSCOPY WITH PROPOFOL N/A 03/31/2018   Procedure: COLONOSCOPY WITH PROPOFOL;  Surgeon: Jonathon Bellows, MD;  Location[facility] END[facility]COPY;  Service: Gastroenterology;  Laterality: N/A;  . FOOT SURGERY      Medical History: Past Medical History:  Diagnosis Date  . Anxiety   . Asthma   . Depression    . Environmental allergies   . Hyperlipidemia   . Hypertension   . Rheumatoid arthritis ([place])   . Sleep apnea 2019   Borderline  . Type 2 diabetes mellitus ([facility])     Family History: Family History  Problem Relation Age of Onset  . Diabetes Mother   . COPD Mother   . Uterine cancer Mother   . Cancer Sister 57       Bile Duct    Social History   Socioeconomic History  . Marital status: Married    Spouse name: Not on file  . Number of children: Not on file  . Years of education: Not on file  . Highest education level: Not on file  Occupational History  . Not on file  Social Needs  . Financial resource strain: Not on file  . Food insecurity:    Worry: Not on file    Inability: Not on file  . Transportation needs:    Medical: Not on file    Non-medical: Not on file  Tobacco Use  . Smoking status: Never Smoker  . Smokeless tobacco: Never Used  Substance and Sexual Activity  . Alcohol use: Yes  . Drug use: No  . Sexual activity: Yes  Lifestyle  . Physical activity:    Days per week: Not on file    Minutes per session: Not on file  . Stress: Not on file  Relationships  . Social connections:    Talks on phone: Not on  file    Gets together: Not on file    Attends religious service: Not on file    Active member of club or organization: Not on file    Attends meetings of clubs or organizations: Not on file    Relationship status: Not on file  . Intimate partner violence:    Fear of current or ex partner: Not on file    Emotionally abused: Not on file    Physically abused: Not on file    Forced sexual activity: Not on file  Other Topics Concern  . Not on file  Social History Narrative  . Not on file      Review of Systems  Constitutional: Negative for chills, fatigue and unexpected weight change.  HENT: Negative for congestion, rhinorrhea, sneezing and sore throat.   Eyes: Negative for photophobia, pain and redness.  Respiratory: Negative for cough, chest  tightness and shortness of breath.   Cardiovascular: Negative for chest pain and palpitations.  Gastrointestinal: Negative for abdominal pain, constipation, diarrhea, nausea and vomiting.  Endocrine: Negative.   Genitourinary: Negative for dysuria and frequency.  Musculoskeletal: Negative for arthralgias, back pain, joint swelling and neck pain.  Skin: Negative for rash.  Allergic/Immunologic: Negative.   Neurological: Negative for tremors and numbness.  Hematological: Negative for adenopathy. Does not bruise/bleed easily.  Psychiatric/Behavioral: Negative for behavioral problems and sleep disturbance. The patient is not nervous/anxious.     Vital Signs: BP 120/72 (BP Location: Right Arm, Patient Position: Sitting, Cuff Size: Normal)   Pulse 80   Resp 16   Ht 5\' 3"  (1.6 m)   Wt 231 lb (104.8 kg)   SpO2 97%   BMI 40.92 kg/m    Physical Exam  Constitutional: She is oriented to person, place, and time. She appears well-developed and well-nourished. No distress.  HENT:  Head: Normocephalic and atraumatic.  Mouth/Throat: Oropharynx is clear and moist. No oropharyngeal exudate.  Eyes: Pupils are equal, round, and reactive to light. EOM are normal.  Neck: Normal range of motion. Neck supple. No JVD present. No tracheal deviation present. No thyromegaly present.  Cardiovascular: Normal rate, regular rhythm and normal heart sounds. Exam reveals no gallop and no friction rub.  No murmur heard. Pulmonary/Chest: Effort normal and breath sounds normal. No respiratory distress. She has no wheezes. She has no rales. She exhibits no tenderness.  Abdominal: Soft. There is no tenderness. There is no guarding.  Musculoskeletal: Normal range of motion.  Lymphadenopathy:    She has no cervical adenopathy.  Neurological: She is alert and oriented to person, place, and time. No cranial nerve deficit.  Skin: Skin is warm and dry. She is not diaphoretic.  Psychiatric: She has a normal mood and affect.  Her behavior is normal. Judgment and thought content normal.  Nursing note and vitals reviewed.   Assessment/Plan: 1. Primary localized osteoarthrosis Refill patient's tramadol at this time. Reviewed risks and possible side effects associated with taking opiates, benzodiazepines and other CNS depressants. Combination of these could cause dizziness and drowsiness. Advised patient not to drive or operate machinery when taking these medications, as patient's and other's life can be at risk and will have consequences. Patient verbalized understanding in this matter. Dependence and abuse for these drugs will be monitored closely. A Controlled substance policy and procedure is on file which allows [facility] to order a urine drug screen test at any visit. Patient understands and agrees with the plan - traMADol (ULTRAM) 50 MG tablet; Take 1 tablet (  50 mg total) by mouth 2 (two) times daily.  Dispense: 60 tablet; Refill: 2  2. Essential (primary) hypertension Patient's blood pressure is well controlled at this time.  Continue current medications as directed. Hypertension Counseling:   The following hypertensive lifestyle modification were recommended and discussed:  1. Limiting alcohol intake to less than 1 oz/day of ethanol:(24 oz of beer or 8 oz of wine or 2 oz of 100-proof whiskey). 2. Take baby ASA 81 mg daily. 3. Importance of regular aerobic exercise and losing weight. 4. Reduce dietary saturated fat and cholesterol intake for overall cardiovascular health. 5. Maintaining adequate dietary potassium, calcium, and magnesium intake. 6. Regular monitoring of the blood pressure. 7. Reduce sodium intake to less than 100 mmol/day (less than 2.3 gm of sodium or less than 6 gm of sodium choride)   3. Morbid obesity (HCC) Obesity Counseling: Risk Assessment: An assessment of behavioral risk factors was made today and includes lack of exercise sedentary lifestyle, lack of portion control and  poor dietary habits.  Risk Modification Advice: She was counseled on portion control guidelines. Restricting daily caloric intake to. . The detrimental long term effects of obesity on her health and ongoing poor compliance was also discussed with the patient.  4. Depression, unspecified depression type Patient denies any complaints.  Reports depression is well controlled.  Continue medication.  5. Hyperlipidemia, unspecified hyperlipidemia type Continue current medication regimen.  General Counseling: Perla verbalizes understanding of the findings of todays visit and agrees with plan of treatment. I have discussed any further diagnostic evaluation that may be needed or ordered today. We also reviewed her medications today. she has been encouraged to call the office with any questions or concerns that should arise related to todays visit.    No orders of the defined types were placed in this encounter.   Meds ordered this encounter  Medications  . traMADol (ULTRAM) 50 MG tablet    Sig: Take 1 tablet (50 mg total) by mouth 2 (two) times daily.    Dispense:  60 tablet    Refill:  2    Time spent: 25 Minutes   This patient was seen by Orson Gear AGNP-C in Collaboration with Dr Lavera Guise as a part of collaborative care agreement     Kendell Bane AGNP-C Internal medicine

## 2018-06-05 ENCOUNTER — Other Ambulatory Visit: Payer: Self-pay | Admitting: Internal Medicine

## 2018-06-05 DIAGNOSIS — F411 Generalized anxiety disorder: Secondary | ICD-10-CM

## 2018-06-05 NOTE — Telephone Encounter (Signed)
Last seen on 05/02/18, can you please fill?

## 2018-06-20 ENCOUNTER — Ambulatory Visit: Payer: BLUE CROSS/BLUE SHIELD | Admitting: Obstetrics and Gynecology

## 2018-06-30 ENCOUNTER — Other Ambulatory Visit: Payer: Self-pay | Admitting: Internal Medicine

## 2018-06-30 ENCOUNTER — Other Ambulatory Visit: Payer: Self-pay | Admitting: Adult Health

## 2018-06-30 ENCOUNTER — Telehealth: Payer: Self-pay

## 2018-06-30 DIAGNOSIS — M1991 Primary osteoarthritis, unspecified site: Secondary | ICD-10-CM

## 2018-06-30 DIAGNOSIS — F411 Generalized anxiety disorder: Secondary | ICD-10-CM

## 2018-06-30 NOTE — Telephone Encounter (Signed)
Next2/11/20

## 2018-06-30 NOTE — Telephone Encounter (Signed)
Send med tramadol

## 2018-07-09 ENCOUNTER — Ambulatory Visit: Payer: BLUE CROSS/BLUE SHIELD | Admitting: Nurse Practitioner

## 2018-07-09 ENCOUNTER — Encounter: Payer: Self-pay | Admitting: Nurse Practitioner

## 2018-07-09 ENCOUNTER — Other Ambulatory Visit: Payer: Self-pay | Admitting: Nurse Practitioner

## 2018-07-09 VITALS — BP 149/94 | HR 83 | Temp 99.3°F | Resp 16 | Ht 63.0 in | Wt 230.0 lb

## 2018-07-09 DIAGNOSIS — R05 Cough: Secondary | ICD-10-CM

## 2018-07-09 DIAGNOSIS — R059 Cough, unspecified: Secondary | ICD-10-CM

## 2018-07-09 DIAGNOSIS — R6889 Other general symptoms and signs: Secondary | ICD-10-CM | POA: Diagnosis not present

## 2018-07-09 DIAGNOSIS — J069 Acute upper respiratory infection, unspecified: Secondary | ICD-10-CM | POA: Insufficient documentation

## 2018-07-09 DIAGNOSIS — R062 Wheezing: Secondary | ICD-10-CM | POA: Diagnosis not present

## 2018-07-09 LAB — POCT INFLUENZA A/B
INFLUENZA A, POC: NEGATIVE
INFLUENZA B, POC: NEGATIVE

## 2018-07-09 MED ORDER — HYDROCOD POLST-CPM POLST ER 10-8 MG/5ML PO SUER: 5.0000 mL | Freq: Two times a day (BID) | ORAL | 0 refills | Status: DC | PRN

## 2018-07-09 MED ORDER — ALBUTEROL SULFATE HFA 108 (90 BASE) MCG/ACT IN AERS: 2.0000 | INHALATION_SPRAY | Freq: Four times a day (QID) | RESPIRATORY_TRACT | 2 refills | Status: DC | PRN

## 2018-07-09 MED ORDER — SULFAMETHOXAZOLE-TRIMETHOPRIM 800-160 MG PO TABS: 1.0000 | ORAL_TABLET | Freq: Two times a day (BID) | ORAL | 0 refills | Status: DC

## 2018-07-09 NOTE — Progress Notes (Signed)
[facility] [place], [place] [address]  Internal MEDICINE  Office Visit Note  Patient Name: Kristen Sparks  [id number]  [id number]  Date of Service: 07/09/2018   Pt is here for a sick visit.  Chief Complaint  Patient presents with  . Sinusitis  . Cough  . Fever  . Generalized Body Aches     Sinusitis  This is a new problem. The current episode started in the past 7 days. The problem is unchanged. The maximum temperature recorded prior to her arrival was 100.4 - 100.9 F. The fever has been present for 1 to 2 days. Her pain is at a severity of 3/10. The pain is mild. Associated symptoms include chills, congestion, coughing, ear pain, headaches, a hoarse voice, shortness of breath, sinus pressure, a sore throat and swollen glands. Past treatments include acetaminophen (mucinex).  Cough  This is a chronic problem. The current episode started in the past 7 days. The problem has been unchanged. The problem occurs every few minutes. The cough is productive of sputum. Associated symptoms include chills, ear congestion, ear pain, a fever, headaches, nasal congestion, postnasal drip, rhinorrhea, a sore throat, shortness of breath and wheezing. Pertinent negatives include no chest pain or rash. She has tried OTC cough suppressant for the symptoms. The treatment provided no relief. Her past medical history is significant for environmental allergies.        Current Medication:  Outpatient Encounter Medications as of 07/09/2018  Medication Sig  . ALPRAZolam (XANAX) 0.5 MG tablet TAKE 1/2 TO 1 TABLET BY MOUTH EVERY DAY AS NEEDED  . benazepril-hydrochlorthiazide (LOTENSIN HCT) 10-12.5 MG tablet Take 1 tablet by mouth daily.  Marland Kitchen omeprazole (PRILOSEC) 40 MG capsule TAKE ONE CAPSULE BY MOUTH EVERY MORNING THIRTY MINUTES PRIOR TO FOOD OR MEDICATIONS  . Prasterone (INTRAROSA) 6.5 MG INST Place 6.5 mg vaginally at bedtime.  . sertraline (ZOLOFT) 100 MG tablet TAKE 1  TABLET(100 MG) BY MOUTH DAILY  . traMADol (ULTRAM) 50 MG tablet TAKE 1 TABLET(50 MG) BY MOUTH TWICE DAILY  . albuterol (PROVENTIL HFA;VENTOLIN HFA) 108 (90 Base) MCG/ACT inhaler Inhale 2 puffs into the lungs every 6 (six) hours as needed for wheezing or shortness of breath.  . chlorpheniramine-HYDROcodone (TUSSIONEX PENNKINETIC ER) 10-8 MG/5ML SUER Take 5 mLs by mouth every 12 (twelve) hours as needed for cough.  . sulfamethoxazole-trimethoprim (BACTRIM DS,SEPTRA DS) 800-160 MG tablet Take 1 tablet by mouth 2 (two) times daily.   No facility-administered encounter medications on file as of 07/09/2018.       Medical History: Past Medical History:  Diagnosis Date  . Anxiety   . Asthma   . Depression   . Environmental allergies   . Hyperlipidemia   . Hypertension   . Rheumatoid arthritis ([place])   . Sleep apnea 2019   Borderline  . Type 2 diabetes mellitus ([place])     Today's Vitals   07/09/18 0943  BP: (!) 149/94  Pulse: 83  Resp: 16  Temp: 99.3 F (37.4 C)  SpO2: 96%  Weight: 230 lb (104.3 kg)  Height: 5\' 3"  (1.6 m)    Review of Systems  Constitutional: Positive for chills, fatigue and fever.  HENT: Positive for congestion, ear pain, hoarse voice, postnasal drip, rhinorrhea, sinus pressure, sinus pain, sore throat and voice change.   Respiratory: Positive for cough, shortness of breath and wheezing.   Cardiovascular: Negative for chest pain and palpitations.  Gastrointestinal: Positive for nausea.  Endocrine: Negative.   Musculoskeletal:  Positive for arthralgias.  Skin: Negative for rash.  Allergic/Immunologic: Positive for environmental allergies.  Neurological: Positive for headaches.  Psychiatric/Behavioral: Negative.     Physical Exam Nursing note reviewed.  Constitutional:      General: She is in acute distress.     Appearance: She is well-developed. She is ill-appearing. She is not diaphoretic.  HENT:     Head: Normocephalic and atraumatic.     Right Ear:  Tympanic membrane is bulging.     Left Ear: Tympanic membrane is bulging.     Nose: Congestion and rhinorrhea present.     Mouth/Throat:     Pharynx: Posterior oropharyngeal erythema present. No oropharyngeal exudate.  Eyes:     Pupils: Pupils are equal, round, and reactive to light.  Neck:     Musculoskeletal: Normal range of motion and neck supple.     Thyroid: No thyromegaly.     Vascular: No JVD.     Trachea: No tracheal deviation.  Cardiovascular:     Rate and Rhythm: Normal rate and regular rhythm.     Heart sounds: Normal heart sounds. No murmur. No friction rub. No gallop.   Pulmonary:     Effort: Pulmonary effort is normal. No respiratory distress.     Breath sounds: No wheezing or rales.     Comments: Dry, harsh cough present. Patient sounds stridulous. No wheezing or rhonchi are auscultated.  Chest:     Chest wall: No tenderness.  Abdominal:     General: Bowel sounds are normal.     Palpations: Abdomen is soft.  Musculoskeletal: Normal range of motion.  Lymphadenopathy:     Cervical: No cervical adenopathy.  Skin:    General: Skin is warm and dry.  Neurological:     Mental Status: She is alert and oriented to person, place, and time.     Cranial Nerves: No cranial nerve deficit.  Psychiatric:        Behavior: Behavior normal.        Thought Content: Thought content normal.        Judgment: Judgment normal.   Assessment/Plan: .1. Flu-like symptoms - POCT Influenza A/B negative today.   2. Acute upper respiratory infection Start bactrim ds bid for 10 days. Rest and increase fluids. Recommend use of OTC to improve symptoms.  - sulfamethoxazole-trimethoprim (BACTRIM DS,SEPTRA DS) 800-160 MG tablet; Take 1 tablet by mouth 2 (two) times daily.  Dispense: 20 tablet; Refill: 0  3. Cough tussionex may be taken up to twice daily as needed for cough. Advised patient not to overuse this medicine and not to mix with other medications or alcohol as it can cause respiratory  distress, sleepiness or dizziness. Should also avoid driving. Patient voiced understanding and agreement.  - chlorpheniramine-HYDROcodone (TUSSIONEX PENNKINETIC ER) 10-8 MG/5ML SUER; Take 5 mLs by mouth every 12 (twelve) hours as needed for cough.  Dispense: 115 mL; Refill: 0  4. Wheezing Add rescue inhaler. May use 2 inhalations every 6 hours as needed for wheezing and shortness of breath.  - albuterol (PROVENTIL HFA;VENTOLIN HFA) 108 (90 Base) MCG/ACT inhaler; Inhale 2 puffs into the lungs every 6 (six) hours as needed for wheezing or shortness of breath.  Dispense: 1 Inhaler; Refill: 2  General Counseling: Johan verbalizes understanding of the findings of todays visit and agrees with plan of treatment. I have discussed any further diagnostic evaluation that may be needed or ordered today. We also reviewed her medications today. she has been encouraged to call the office with  any questions or concerns that should arise related to todays visit.    Counseling:  Rest and increase fluids. Continue using OTC medication to control symptoms.   This patient was seen by Leretha Pol FNP Collaboration with Dr Lavera Guise as a part of collaborative care agreement  Orders Placed This Encounter  Procedures  . POCT Influenza A/B    Meds ordered this encounter  Medications  . chlorpheniramine-HYDROcodone (TUSSIONEX PENNKINETIC ER) 10-8 MG/5ML SUER    Sig: Take 5 mLs by mouth every 12 (twelve) hours as needed for cough.    Dispense:  115 mL    Refill:  0    Order Specific Question:   Supervising Provider    Answer:   Lavera Guise [[id number]]  . sulfamethoxazole-trimethoprim (BACTRIM DS,SEPTRA DS) 800-160 MG tablet    Sig: Take 1 tablet by mouth 2 (two) times daily.    Dispense:  20 tablet    Refill:  0    Order Specific Question:   Supervising Provider    Answer:   Lavera Guise [[id number]]  . albuterol (PROVENTIL HFA;VENTOLIN HFA) 108 (90 Base) MCG/ACT inhaler    Sig: Inhale 2 puffs into the lungs  every 6 (six) hours as needed for wheezing or shortness of breath.    Dispense:  1 Inhaler    Refill:  2    Order Specific Question:   Supervising Provider    Answer:   Lavera Guise [[id number]]    Time spent: 25 Minutes

## 2018-07-09 NOTE — Progress Notes (Signed)
Sent tussionex cough suppressant to [facility] [place] as [facility] in [place] did not have.

## 2018-08-01 ENCOUNTER — Other Ambulatory Visit: Payer: Self-pay

## 2018-08-01 MED ORDER — PREDNISONE 10 MG (21) PO TBPK: 10.0000 mg | ORAL_TABLET | Freq: Every day | ORAL | 0 refills | Status: DC

## 2018-08-01 NOTE — Telephone Encounter (Signed)
Pt called today had poison oak as per Quita Skye send prednisone and advised her keep follow next tuesday

## 2018-08-05 ENCOUNTER — Ambulatory Visit: Payer: BLUE CROSS/BLUE SHIELD | Admitting: Adult Health

## 2018-08-05 ENCOUNTER — Encounter: Payer: Self-pay | Admitting: Adult Health

## 2018-08-05 VITALS — BP 146/98 | HR 73 | Resp 16 | Ht 63.0 in | Wt 229.0 lb

## 2018-08-05 DIAGNOSIS — F329 Major depressive disorder, single episode, unspecified: Secondary | ICD-10-CM

## 2018-08-05 DIAGNOSIS — E785 Hyperlipidemia, unspecified: Secondary | ICD-10-CM | POA: Diagnosis not present

## 2018-08-05 DIAGNOSIS — I10 Essential (primary) hypertension: Secondary | ICD-10-CM | POA: Diagnosis not present

## 2018-08-05 DIAGNOSIS — F32A Depression, unspecified: Secondary | ICD-10-CM

## 2018-08-05 DIAGNOSIS — M1991 Primary osteoarthritis, unspecified site: Secondary | ICD-10-CM

## 2018-08-05 MED ORDER — TRAMADOL HCL 50 MG PO TABS: 50.0000 mg | ORAL_TABLET | Freq: Two times a day (BID) | ORAL | 2 refills | Status: DC

## 2018-08-05 NOTE — Progress Notes (Signed)
[facility] [place], [place] [address]  Internal MEDICINE  Office Visit Note  Patient Name: Kristen Sparks  [id number]  [id number]  Date of Service: 08/05/2018  Chief Complaint  Patient presents with  . Hypertension  . Hyperlipidemia  . Depression  . Anxiety    HPI Pt is here for follow up on HTN, HLD, Depression, anxiety and chronic pain due to osteoarthrosis.  Generally the patient is doing well.  She did get into some poison ivy in the last week has been on a prednisone taper over the last few days.  She reports the itching has slowed down and the rash from the poison ivy seem to be getting better slowly.  Her blood pressure slightly elevated at today's visit however the prednisone taper probably is the culprit behind this.  She denies any issues with anxiety or depression currently and states that she feels it well under control.  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    Current Medication: Outpatient Encounter Medications as of 08/05/2018  Medication Sig  . albuterol (PROVENTIL HFA;VENTOLIN HFA) 108 (90 Base) MCG/ACT inhaler Inhale 2 puffs into the lungs every 6 (six) hours as needed for wheezing or shortness of breath.  . ALPRAZolam (XANAX) 0.5 MG tablet TAKE 1/2 TO 1 TABLET BY MOUTH EVERY DAY AS NEEDED  . benazepril-hydrochlorthiazide (LOTENSIN HCT) 10-12.5 MG tablet Take 1 tablet by mouth daily.  . chlorpheniramine-HYDROcodone (TUSSIONEX PENNKINETIC ER) 10-8 MG/5ML SUER Take 5 mLs by mouth every 12 (twelve) hours as needed for cough.  Marland Kitchen omeprazole (PRILOSEC) 40 MG capsule TAKE ONE CAPSULE BY MOUTH EVERY MORNING THIRTY MINUTES PRIOR TO FOOD OR MEDICATIONS  . Prasterone (INTRAROSA) 6.5 MG INST Place 6.5 mg vaginally at bedtime.  . predniSONE (STERAPRED UNI-PAK 21 TAB) 10 MG (21) TBPK tablet Take 1 tablet (10 mg total) by mouth daily.  . sertraline (ZOLOFT) 100 MG  tablet TAKE 1 TABLET(100 MG) BY MOUTH DAILY  . sulfamethoxazole-trimethoprim (BACTRIM DS,SEPTRA DS) 800-160 MG tablet Take 1 tablet by mouth 2 (two) times daily.  . traMADol (ULTRAM) 50 MG tablet Take 1 tablet (50 mg total) by mouth 2 (two) times daily.  . [DISCONTINUED] traMADol (ULTRAM) 50 MG tablet TAKE 1 TABLET(50 MG) BY MOUTH TWICE DAILY   No facility-administered encounter medications on file as of 08/05/2018.     Surgical History: Past Surgical History:  Procedure Laterality Date  . CHOLECYSTECTOMY  2000  . colon polyectomy    . COLONOSCOPY WITH PROPOFOL N/A 03/31/2018   Procedure: COLONOSCOPY WITH PROPOFOL;  Surgeon: Jonathon Bellows, MD;  Location: [facility] ENDOSCOPY;  Service: Gastroenterology;  Laterality: N/A;  . FOOT SURGERY      Medical History: Past Medical History:  Diagnosis Date  . Anxiety   . Asthma   . Depression   . Environmental allergies   . Hyperlipidemia   . Hypertension   . Rheumatoid arthritis ([place])   . Sleep apnea 2019   Borderline  . Type 2 diabetes mellitus ([facility])     Family History: Family History  Problem Relation Age of Onset  . Diabetes Mother   . COPD Mother   . Uterine cancer Mother   . Cancer Sister 33       Bile Duct    Social History   Socioeconomic History  . Marital status: Married    Spouse name: Not on file  . Number of children: Not  on file  . Years of education: Not on file  . Highest education level: Not on file  Occupational History  . Not on file  Social Needs  . Financial resource strain: Not on file  . Food insecurity:    Worry: Not on file    Inability: Not on file  . Transportation needs:    Medical: Not on file    Non-medical: Not on file  Tobacco Use  . Smoking status: Never Smoker  . Smokeless tobacco: Never Used  Substance and Sexual Activity  . Alcohol use: Yes  . Drug use: No  . Sexual activity: Yes  Lifestyle  . Physical activity:    Days per week: Not on file    Minutes per session: Not on file  .  Stress: Not on file  Relationships  . Social connections:    Talks on phone: Not on file    Gets together: Not on file    Attends religious service: Not on file    Active member of club or organization: Not on file    Attends meetings of clubs or organizations: Not on file    Relationship status: Not on file  . Intimate partner violence:    Fear of current or ex partner: Not on file    Emotionally abused: Not on file    Physically abused: Not on file    Forced sexual activity: Not on file  Other Topics Concern  . Not on file  Social History Narrative  . Not on file      Review of Systems  Constitutional: Negative for chills, fatigue and unexpected weight change.  HENT: Negative for congestion, rhinorrhea, sneezing and sore throat.   Eyes: Negative for photophobia, pain and redness.  Respiratory: Negative for cough, chest tightness and shortness of breath.   Cardiovascular: Negative for chest pain and palpitations.  Gastrointestinal: Negative for abdominal pain, constipation, diarrhea, nausea and vomiting.  Endocrine: Negative.   Genitourinary: Negative for dysuria and frequency.  Musculoskeletal: Negative for arthralgias, back pain, joint swelling and neck pain.  Skin: Negative for rash.  Allergic/Immunologic: Negative.   Neurological: Negative for tremors and numbness.  Hematological: Negative for adenopathy. Does not bruise/bleed easily.  Psychiatric/Behavioral: Negative for behavioral problems and sleep disturbance. The patient is not nervous/anxious.     Vital Signs: BP (!) 146/98   Pulse 73   Resp 16   Ht 5\' 3"  (1.6 m)   Wt 229 lb (103.9 kg)   SpO2 95%   BMI 40.57 kg/m    Physical Exam Vitals signs and nursing note reviewed.  Constitutional:      General: She is not in acute distress.    Appearance: She is well-developed. She is not diaphoretic.  HENT:     Head: Normocephalic and atraumatic.     Mouth/Throat:     Pharynx: No oropharyngeal exudate.   Eyes:     Pupils: Pupils are equal, round, and reactive to light.  Neck:     Musculoskeletal: Normal range of motion and neck supple.     Thyroid: No thyromegaly.     Vascular: No JVD.     Trachea: No tracheal deviation.  Cardiovascular:     Rate and Rhythm: Normal rate and regular rhythm.     Heart sounds: Normal heart sounds. No murmur. No friction rub. No gallop.   Pulmonary:     Effort: Pulmonary effort is normal. No respiratory distress.     Breath sounds: Normal breath sounds. No wheezing  or rales.  Chest:     Chest wall: No tenderness.  Abdominal:     Palpations: Abdomen is soft.     Tenderness: There is no abdominal tenderness. There is no guarding.  Musculoskeletal: Normal range of motion.  Lymphadenopathy:     Cervical: No cervical adenopathy.  Skin:    General: Skin is warm and dry.  Neurological:     Mental Status: She is alert and oriented to person, place, and time.     Cranial Nerves: No cranial nerve deficit.  Psychiatric:        Behavior: Behavior normal.        Thought Content: Thought content normal.        Judgment: Judgment normal.    Assessment/Plan: 1. Essential (primary) hypertension Slightly elevated today.  Pt is on prednisone for poison ivy which is likely making her blood pressure be elevated.  We will continue to follow at future visit.  Instructed to take her blood pressure at home and let us know if it does not come down once the prednisone taper has completed.  2. Primary localized osteoarthrosis Pt has chronic pain due to bone spurs in neck.  She uses tramadol with good results.  Finally  - traMADol (ULTRAM) 50 MG tablet; Take 1 tablet (50 mg total) by mouth 2 (two) times daily.  Dispense: 60 tablet; Refill: 2  3. Hyperlipidemia, unspecified hyperlipidemia type Stable, will recheck lipid panel at next blood draw.   4. Morbid obesity ([place]) Obesity Counseling: Risk Assessment: An assessment of behavioral risk factors was made today and  includes lack of exercise sedentary lifestyle, lack of portion control and poor dietary habits.  Risk Modification Advice: She was counseled on portion control guidelines. Restricting daily caloric intake to. . The detrimental long term effects of obesity on her health and ongoing poor compliance was also discussed with the patient.  5. Depression, unspecified depression type Doing well at this time.    General Counseling: Marenda verbalizes understanding of the findings of todays visit and agrees with plan of treatment. I have discussed any further diagnostic evaluation that may be needed or ordered today. We also reviewed her medications today. she has been encouraged to call the office with any questions or concerns that should arise related to todays visit.    No orders of the defined types were placed in this encounter.   Meds ordered this encounter  Medications  . traMADol (ULTRAM) 50 MG tablet    Sig: Take 1 tablet (50 mg total) by mouth 2 (two) times daily.    Dispense:  60 tablet    Refill:  2    Time spent: 25 Minutes   This patient was seen by Orson Gear AGNP-C in Collaboration with Dr Lavera Guise as a part of collaborative care agreement     Kendell Bane AGNP-C Internal medicine

## 2018-08-05 NOTE — Patient Instructions (Addendum)

## 2018-08-15 ENCOUNTER — Other Ambulatory Visit: Payer: Self-pay | Admitting: Adult Health

## 2018-08-15 DIAGNOSIS — F411 Generalized anxiety disorder: Secondary | ICD-10-CM

## 2018-08-18 ENCOUNTER — Other Ambulatory Visit: Payer: Self-pay | Admitting: Adult Health

## 2018-08-18 DIAGNOSIS — F411 Generalized anxiety disorder: Secondary | ICD-10-CM

## 2018-08-18 MED ORDER — ALPRAZOLAM 0.5 MG PO TABS: ORAL_TABLET | ORAL | 0 refills | Status: DC

## 2018-08-18 NOTE — Telephone Encounter (Signed)
Last 08/05/2018

## 2018-08-18 NOTE — Progress Notes (Signed)
NEW RX sent for patients xanax to [facility] [place].

## 2018-08-29 ENCOUNTER — Ambulatory Visit: Payer: Self-pay | Admitting: Adult Health

## 2018-08-31 ENCOUNTER — Other Ambulatory Visit: Payer: Self-pay | Admitting: Adult Health

## 2018-08-31 DIAGNOSIS — M1991 Primary osteoarthritis, unspecified site: Secondary | ICD-10-CM

## 2018-09-11 ENCOUNTER — Other Ambulatory Visit: Payer: Self-pay | Admitting: Adult Health

## 2018-09-11 DIAGNOSIS — F411 Generalized anxiety disorder: Secondary | ICD-10-CM

## 2018-09-11 NOTE — Telephone Encounter (Signed)
Last2/20 next5/20

## 2018-09-22 ENCOUNTER — Other Ambulatory Visit: Payer: Self-pay

## 2018-09-22 ENCOUNTER — Encounter: Payer: Self-pay | Admitting: Nurse Practitioner

## 2018-09-22 ENCOUNTER — Ambulatory Visit: Payer: BLUE CROSS/BLUE SHIELD | Admitting: Nurse Practitioner

## 2018-09-22 VITALS — BP 178/92 | HR 73 | Temp 97.0°F | Resp 16 | Ht 63.0 in | Wt 229.0 lb

## 2018-09-22 DIAGNOSIS — R079 Chest pain, unspecified: Secondary | ICD-10-CM | POA: Diagnosis not present

## 2018-09-22 DIAGNOSIS — M1991 Primary osteoarthritis, unspecified site: Secondary | ICD-10-CM

## 2018-09-22 DIAGNOSIS — M791 Myalgia, unspecified site: Secondary | ICD-10-CM | POA: Diagnosis not present

## 2018-09-22 MED ORDER — CYCLOBENZAPRINE HCL 10 MG PO TABS: ORAL_TABLET | ORAL | 0 refills | Status: DC

## 2018-09-22 MED ORDER — METHYLPREDNISOLONE 4 MG PO TBPK: ORAL_TABLET | ORAL | 0 refills | Status: DC

## 2018-09-22 NOTE — Progress Notes (Signed)
Pt bp is high she having pain

## 2018-09-22 NOTE — Progress Notes (Signed)
[facility] [place], [place] [address]  Internal MEDICINE  Office Visit Note  Patient Name: Kristen Sparks  [id number]  [id number]  Date of Service: 09/26/2018   Pt is here for a sick visit.  Chief Complaint  Patient presents with  . Pain    neck, shoulder, and arm pain.      The patient is c/o neck and left shoulder and arm pain. Pain radiates into the left upper chest and around the back. She states that pain started after period of overexertion. She was working outside in her yard for several hours in unusually hot temperatures for the time of year. Every since then, she has had moderate discomfort across the chest and in the right side of the neck, and also in the left shoulder and left arm. Feels weak and very fatigued. Does not feel short of breath.        Current Medication:  Outpatient Encounter Medications as of 09/22/2018  Medication Sig  . albuterol (PROVENTIL HFA;VENTOLIN HFA) 108 (90 Base) MCG/ACT inhaler Inhale 2 puffs into the lungs every 6 (six) hours as needed for wheezing or shortness of breath.  . ALPRAZolam (XANAX) 0.5 MG tablet TAKE 1/2 TO 1 TABLET BY MOUTH EVERY DAY AS NEEDED  . benazepril-hydrochlorthiazide (LOTENSIN HCT) 10-12.5 MG tablet TAKE 1 TABLET BY MOUTH DAILY  . cyclobenzaprine (FLEXERIL) 10 MG tablet Take 1/2 to 1 tablet po Bid prn muscle pain  . methylPREDNISolone (MEDROL) 4 MG TBPK tablet Take by mouth as directed for 6 days  . omeprazole (PRILOSEC) 40 MG capsule TAKE ONE CAPSULE BY MOUTH EVERY MORNING THIRTY MINUTES PRIOR TO FOOD OR MEDICATIONS  . Prasterone (INTRAROSA) 6.5 MG INST Place 6.5 mg vaginally at bedtime.  . sertraline (ZOLOFT) 100 MG tablet TAKE 1 TABLET(100 MG) BY MOUTH DAILY  . traMADol (ULTRAM) 50 MG tablet Take 1 tablet (50 mg total) by mouth 2 (two) times daily.  . [DISCONTINUED] chlorpheniramine-HYDROcodone (TUSSIONEX PENNKINETIC ER) 10-8 MG/5ML SUER Take 5 mLs by mouth every 12 (twelve) hours as  needed for cough.  . [DISCONTINUED] predniSONE (STERAPRED UNI-PAK 21 TAB) 10 MG (21) TBPK tablet Take 1 tablet (10 mg total) by mouth daily.  . [DISCONTINUED] sulfamethoxazole-trimethoprim (BACTRIM DS,SEPTRA DS) 800-160 MG tablet Take 1 tablet by mouth 2 (two) times daily.   No facility-administered encounter medications on file as of 09/22/2018.       Medical History: Past Medical History:  Diagnosis Date  . Anxiety   . Asthma   . Depression   . Environmental allergies   . Hyperlipidemia   . Hypertension   . Rheumatoid arthritis ([place])   . Sleep apnea 2019   Borderline  . Type 2 diabetes mellitus ([place])      Today's Vitals   09/22/18 1124  BP: (!) 178/92  Pulse: 73  Resp: 16  Temp: (!) 97 F (36.1 C)  SpO2: 95%  Weight: 229 lb (103.9 kg)  Height: 5\' 3"  (1.6 m)   Body mass index is 40.57 kg/m.  Review of Systems  Constitutional: Positive for fatigue. Negative for chills and unexpected weight change.  HENT: Negative for congestion, postnasal drip, rhinorrhea, sneezing and sore throat.   Respiratory: Negative for cough, chest tightness, shortness of breath and wheezing.   Cardiovascular: Positive for chest pain. Negative for palpitations.  Gastrointestinal: Negative for abdominal pain, constipation, diarrhea, nausea and vomiting.  Genitourinary: Negative for dysuria and frequency.  Musculoskeletal: Positive for back pain, myalgias and neck pain. Negative  for arthralgias and joint swelling.       Tightness right side of the neck, left shoulder and arm pain with weakness.   Skin: Negative for rash.  Neurological: Positive for headaches. Negative for tremors and numbness.  Hematological: Negative for adenopathy. Does not bruise/bleed easily.  Psychiatric/Behavioral: Negative for behavioral problems (Depression), sleep disturbance and suicidal ideas. The patient is nervous/anxious.     Physical Exam Vitals signs and nursing note reviewed.  Constitutional:      General:  She is not in acute distress.    Appearance: She is well-developed. She is not diaphoretic.     Comments: In mild distress  HENT:     Head: Normocephalic and atraumatic.     Mouth/Throat:     Pharynx: No oropharyngeal exudate.  Eyes:     Pupils: Pupils are equal, round, and reactive to light.  Neck:     Musculoskeletal: Normal range of motion and neck supple. Muscular tenderness present.     Thyroid: No thyromegaly.     Vascular: No JVD.     Trachea: No tracheal deviation.  Cardiovascular:     Rate and Rhythm: Normal rate and regular rhythm.     Heart sounds: Normal heart sounds. No murmur. No friction rub. No gallop.      Comments: ECG today showing short normal sinus rhythm with short PR. Considered borderline ECG.  Pulmonary:     Effort: Pulmonary effort is normal. No respiratory distress.     Breath sounds: Normal breath sounds. No wheezing or rales.  Chest:     Chest wall: No tenderness.  Abdominal:     General: Bowel sounds are normal.     Palpations: Abdomen is soft.  Musculoskeletal: Normal range of motion.     Comments: Chest tenderness with palpation. Left shoulder tenderness with palpation, especially over the area of AC joint. ROM and strength are mildly diminished. No bruising, bony deformities or abnormalities are noted in any areas. No crepitus palpated.   Lymphadenopathy:     Cervical: No cervical adenopathy.  Skin:    General: Skin is warm and dry.  Neurological:     Mental Status: She is alert and oriented to person, place, and time.     Cranial Nerves: No cranial nerve deficit.  Psychiatric:        Behavior: Behavior normal.        Thought Content: Thought content normal.        Judgment: Judgment normal.   Assessment/Plan: 1. Chest pain, unspecified type - EKG 12-Lead indicates normal sinus rhythm with short PR interval and no other abnormalities. Likely muscular in nature.   2. Primary localized osteoarthrosis Start medrol dose pack. Take as directed  for 6 days. Take previously prescribed pain medication as needed and as prescribed to help reduce pain  - methylPREDNISolone (MEDROL) 4 MG TBPK tablet; Take by mouth as directed for 6 days  Dispense: 21 tablet; Refill: 0  3. Muscle pain May take flexeril 1/2 to 1 tablet twice daily. Advised her to take with caution as this may cause dizziness or drowsness. She voiced understanding of this advice.  - cyclobenzaprine (FLEXERIL) 10 MG tablet; Take 1/2 to 1 tablet po Bid prn muscle pain  Dispense: 30 tablet; Refill: 0  General Counseling: Kenndra verbalizes understanding of the findings of todays visit and agrees with plan of treatment. I have discussed any further diagnostic evaluation that may be needed or ordered today. We also reviewed her medications today. she has been  encouraged to call the office with any questions or concerns that should arise related to todays visit.    Counseling:  This patient was seen by Leretha Pol FNP Collaboration with Dr Lavera Guise as a part of collaborative care agreement  Orders Placed This Encounter  Procedures  . EKG 12-Lead    Meds ordered this encounter  Medications  . methylPREDNISolone (MEDROL) 4 MG TBPK tablet    Sig: Take by mouth as directed for 6 days    Dispense:  21 tablet    Refill:  0    Order Specific Question:   Supervising Provider    Answer:   Lavera Guise [facility]  . cyclobenzaprine (FLEXERIL) 10 MG tablet    Sig: Take 1/2 to 1 tablet po Bid prn muscle pain    Dispense:  30 tablet    Refill:  0    Order Specific Question:   Supervising Provider    Answer:   Lavera Guise [[id number]]    Time spent: 30 Minutes

## 2018-09-26 DIAGNOSIS — M1991 Primary osteoarthritis, unspecified site: Secondary | ICD-10-CM | POA: Insufficient documentation

## 2018-09-26 DIAGNOSIS — R079 Chest pain, unspecified: Secondary | ICD-10-CM | POA: Insufficient documentation

## 2018-09-26 DIAGNOSIS — M791 Myalgia, unspecified site: Secondary | ICD-10-CM | POA: Insufficient documentation

## 2018-09-29 ENCOUNTER — Other Ambulatory Visit: Payer: Self-pay

## 2018-09-29 ENCOUNTER — Other Ambulatory Visit: Payer: Self-pay | Admitting: Internal Medicine

## 2018-09-29 ENCOUNTER — Other Ambulatory Visit: Payer: Self-pay | Admitting: Adult Health

## 2018-09-29 DIAGNOSIS — F411 Generalized anxiety disorder: Secondary | ICD-10-CM

## 2018-09-29 DIAGNOSIS — M1991 Primary osteoarthritis, unspecified site: Secondary | ICD-10-CM

## 2018-09-29 MED ORDER — SERTRALINE HCL 100 MG PO TABS: ORAL_TABLET | ORAL | 0 refills | Status: DC

## 2018-09-30 ENCOUNTER — Other Ambulatory Visit: Payer: Self-pay | Admitting: Adult Health

## 2018-09-30 DIAGNOSIS — F411 Generalized anxiety disorder: Secondary | ICD-10-CM

## 2018-09-30 NOTE — Telephone Encounter (Signed)
Last ov 08/05/18 , follow up on 5/15

## 2018-10-15 ENCOUNTER — Encounter: Payer: Self-pay | Admitting: Nurse Practitioner

## 2018-11-07 ENCOUNTER — Other Ambulatory Visit: Payer: Self-pay

## 2018-11-07 ENCOUNTER — Ambulatory Visit: Payer: BLUE CROSS/BLUE SHIELD | Admitting: Nurse Practitioner

## 2018-11-07 ENCOUNTER — Encounter: Payer: Self-pay | Admitting: Nurse Practitioner

## 2018-11-07 VITALS — BP 137/74 | HR 80 | Resp 16 | Ht 63.0 in | Wt 229.6 lb

## 2018-11-07 DIAGNOSIS — F32A Depression, unspecified: Secondary | ICD-10-CM

## 2018-11-07 DIAGNOSIS — F411 Generalized anxiety disorder: Secondary | ICD-10-CM

## 2018-11-07 DIAGNOSIS — F329 Major depressive disorder, single episode, unspecified: Secondary | ICD-10-CM

## 2018-11-07 DIAGNOSIS — I10 Essential (primary) hypertension: Secondary | ICD-10-CM | POA: Diagnosis not present

## 2018-11-07 DIAGNOSIS — F418 Other specified anxiety disorders: Secondary | ICD-10-CM | POA: Insufficient documentation

## 2018-11-07 MED ORDER — HYDROCHLOROTHIAZIDE 12.5 MG PO TABS: 12.5000 mg | ORAL_TABLET | Freq: Every day | ORAL | 3 refills | Status: DC

## 2018-11-07 MED ORDER — ALPRAZOLAM 0.5 MG PO TABS: ORAL_TABLET | ORAL | 2 refills | Status: DC

## 2018-11-07 MED ORDER — BENAZEPRIL HCL 10 MG PO TABS: 10.0000 mg | ORAL_TABLET | Freq: Every day | ORAL | 3 refills | Status: DC

## 2018-11-07 NOTE — Progress Notes (Signed)
[facility] [place], [place] [address]  Internal MEDICINE  Office Visit Note  Patient Name: Kristen Sparks  [id number]  [id number]  Date of Service: 11/07/2018  Chief Complaint  Patient presents with  . Medical Management of Chronic Issues    3 month follow up  . Hypertension  . Hyperlipidemia  . Anxiety  . Diabetes    pt has been checking blood sugars once every 2wks and its been staying around 120  . Headache    headach on the left side like a nagginf feeling,   . Quality Metric Gaps    pt inquiring about mammo and blood work    Patient is here for routine follow up visit. States that she is doing well. Blood pressure well managed. Did have to pay unusually high copay this last time to fill her bp medication. States that pharmacy told her the price was due to how the prescription was written. Will separate into two different medications to lower copays. Dong well with anxiety and depression. Takes alprazolam 0.5mg  daily if needed. Generally, this is two to three times per week. Does need to have new prescription for her alprazolam today.        Current Medication: Outpatient Encounter Medications as of 11/07/2018  Medication Sig Note  . albuterol (PROVENTIL HFA;VENTOLIN HFA) 108 (90 Base) MCG/ACT inhaler Inhale 2 puffs into the lungs every 6 (six) hours as needed for wheezing or shortness of breath.   . ALPRAZolam (XANAX) 0.5 MG tablet Take 1/2 t o1 tablet po QD prn   . cyclobenzaprine (FLEXERIL) 10 MG tablet Take 1/2 to 1 tablet po Bid prn muscle pain   . omeprazole (PRILOSEC) 40 MG capsule TAKE ONE CAPSULE BY MOUTH EVERY MORNING THIRTY MINUTES PRIOR TO FOOD OR MEDICATIONS   . Prasterone (INTRAROSA) 6.5 MG INST Place 6.5 mg vaginally at bedtime.   . sertraline (ZOLOFT) 100 MG tablet TAKE 1 TABLET(100 MG) BY MOUTH DAILY   . traMADol (ULTRAM) 50 MG tablet Take 1 tablet (50 mg total) by mouth 2 (two) times daily.   . [DISCONTINUED] ALPRAZolam (XANAX)  0.5 MG tablet TAKE 1/2 TO 1 TABLET BY MOUTH EVERY DAY AS NEEDED   . [DISCONTINUED] benazepril-hydrochlorthiazide (LOTENSIN HCT) 10-12.5 MG tablet TAKE 1 TABLET BY MOUTH DAILY 11/07/2018: seprated benazapril and hctz due to expense.  . [DISCONTINUED] methylPREDNISolone (MEDROL) 4 MG TBPK tablet Take by mouth as directed for 6 days   . benazepril (LOTENSIN) 10 MG tablet Take 1 tablet (10 mg total) by mouth daily.   . hydrochlorothiazide (HYDRODIURIL) 12.5 MG tablet Take 1 tablet (12.5 mg total) by mouth daily.    No facility-administered encounter medications on file as of 11/07/2018.     Surgical History: Past Surgical History:  Procedure Laterality Date  . CHOLECYSTECTOMY  2000  . colon polyectomy    . COLONOSCOPY WITH PROPOFOL N/A 03/31/2018   Procedure: COLONOSCOPY WITH PROPOFOL;  Surgeon: Jonathon Bellows, MD;  Location: [facility] ENDOSCOPY;  Service: Gastroenterology;  Laterality: N/A;  . FOOT SURGERY      Medical History: Past Medical History:  Diagnosis Date  . Anxiety   . Asthma   . Depression   . Environmental allergies   . Hyperlipidemia   . Hypertension   . Rheumatoid arthritis ([place])   . Sleep apnea 2019   Borderline  . Type 2 diabetes mellitus ([facility])     Family History: Family History  Problem Relation Age of Onset  . Diabetes Mother   .  COPD Mother   . Uterine cancer Mother   . Cancer Sister 71       Bile Duct    Social History   Socioeconomic History  . Marital status: Married    Spouse name: Not on file  . Number of children: Not on file  . Years of education: Not on file  . Highest education level: Not on file  Occupational History  . Not on file  Social Needs  . Financial resource strain: Not on file  . Food insecurity:    Worry: Not on file    Inability: Not on file  . Transportation needs:    Medical: Not on file    Non-medical: Not on file  Tobacco Use  . Smoking status: Never Smoker  . Smokeless tobacco: Never Used  Substance and Sexual Activity   . Alcohol use: Yes  . Drug use: No  . Sexual activity: Yes  Lifestyle  . Physical activity:    Days per week: Not on file    Minutes per session: Not on file  . Stress: Not on file  Relationships  . Social connections:    Talks on phone: Not on file    Gets together: Not on file    Attends religious service: Not on file    Active member of club or organization: Not on file    Attends meetings of clubs or organizations: Not on file    Relationship status: Not on file  . Intimate partner violence:    Fear of current or ex partner: Not on file    Emotionally abused: Not on file    Physically abused: Not on file    Forced sexual activity: Not on file  Other Topics Concern  . Not on file  Social History Narrative  . Not on file      Review of Systems  Constitutional: Negative for chills, fatigue and unexpected weight change.  HENT: Negative for congestion, rhinorrhea, sneezing and sore throat.   Respiratory: Negative for cough, chest tightness, shortness of breath and wheezing.   Cardiovascular: Negative for chest pain and palpitations.  Gastrointestinal: Negative for abdominal pain, constipation, diarrhea, nausea and vomiting.  Endocrine: Negative for cold intolerance, heat intolerance, polydipsia and polyuria.  Musculoskeletal: Negative for arthralgias, back pain, joint swelling and neck pain.  Skin: Negative for rash.  Allergic/Immunologic: Positive for environmental allergies.  Neurological: Positive for headaches. Negative for tremors and numbness.  Hematological: Negative for adenopathy. Does not bruise/bleed easily.  Psychiatric/Behavioral: Positive for dysphoric mood. Negative for behavioral problems and sleep disturbance. The patient is nervous/anxious.        Well controlled with current medication.    Today's Vitals   11/07/18 0849  BP: 137/74  Pulse: 80  Resp: 16  SpO2: 97%  Weight: 229 lb 9.6 oz (104.1 kg)  Height: 5\' 3"  (1.6 m)   Body mass index is  40.67 kg/m.  Physical Exam Vitals signs and nursing note reviewed.  Constitutional:      General: She is not in acute distress.    Appearance: Normal appearance. She is well-developed. She is not diaphoretic.  HENT:     Head: Normocephalic and atraumatic.     Mouth/Throat:     Pharynx: No oropharyngeal exudate.  Eyes:     Pupils: Pupils are equal, round, and reactive to light.  Neck:     Musculoskeletal: Normal range of motion and neck supple.     Thyroid: No thyromegaly.     Vascular:  No JVD.     Trachea: No tracheal deviation.  Cardiovascular:     Rate and Rhythm: Normal rate and regular rhythm.     Heart sounds: Normal heart sounds. No murmur. No friction rub. No gallop.   Pulmonary:     Effort: Pulmonary effort is normal. No respiratory distress.     Breath sounds: Normal breath sounds. No wheezing or rales.  Chest:     Chest wall: No tenderness.  Abdominal:     Palpations: Abdomen is soft.     Tenderness: There is no abdominal tenderness. There is no guarding.  Musculoskeletal: Normal range of motion.  Lymphadenopathy:     Cervical: No cervical adenopathy.  Skin:    General: Skin is warm and dry.  Neurological:     Mental Status: She is alert and oriented to person, place, and time.     Cranial Nerves: No cranial nerve deficit.  Psychiatric:        Behavior: Behavior normal.        Thought Content: Thought content normal.        Judgment: Judgment normal.    Assessment/Plan: 1. Essential (primary) hypertension Separate prescriptions for benazepril 10mg  and HCTZ 12.5mg  tablets sent to pharmacy. Take daily.  - benazepril (LOTENSIN) 10 MG tablet; Take 1 tablet (10 mg total) by mouth daily.  Dispense: 90 tablet; Refill: 3 - hydrochlorothiazide (HYDRODIURIL) 12.5 MG tablet; Take 1 tablet (12.5 mg total) by mouth daily.  Dispense: 90 tablet; Refill: 3  2. Generalized anxiety disorder May take alprazolam 0.5mg  daily as needed for acute anxiety. New prescription sent  to her pharmacy.  - ALPRAZolam (XANAX) 0.5 MG tablet; Take 1/2 t o1 tablet po QD prn  Dispense: 30 tablet; Refill: 2  3. Depression, unspecified depression type Continue citalopram as prescribed.   General Counseling: Kaytlynn verbalizes understanding of the findings of todays visit and agrees with plan of treatment. I have discussed any further diagnostic evaluation that may be needed or ordered today. We also reviewed her medications today. she has been encouraged to call the office with any questions or concerns that should arise related to todays visit.  Hypertension Counseling:   The following hypertensive lifestyle modification were recommended and discussed:  1. Limiting alcohol intake to less than 1 oz/day of ethanol:(24 oz of beer or 8 oz of wine or 2 oz of 100-proof whiskey). 2. Take baby ASA 81 mg daily. 3. Importance of regular aerobic exercise and losing weight. 4. Reduce dietary saturated fat and cholesterol intake for overall cardiovascular health. 5. Maintaining adequate dietary potassium, calcium, and magnesium intake. 6. Regular monitoring of the blood pressure. 7. Reduce sodium intake to less than 100 mmol/day (less than 2.3 gm of sodium or less than 6 gm of sodium choride)   This patient was seen by [place] with Dr Lavera Guise as a part of collaborative care agreement  Meds ordered this encounter  Medications  . benazepril (LOTENSIN) 10 MG tablet    Sig: Take 1 tablet (10 mg total) by mouth daily.    Dispense:  90 tablet    Refill:  3    Please d/c combination benzapril/HCTZ    Order Specific Question:   Supervising Provider    Answer:   Lavera Guise [place]  . hydrochlorothiazide (HYDRODIURIL) 12.5 MG tablet    Sig: Take 1 tablet (12.5 mg total) by mouth daily.    Dispense:  90 tablet    Refill:  3  Please d/c combination benzapril/HCTZ    Order Specific Question:   Supervising Provider    Answer:   Lavera Guise [[id number]]  . ALPRAZolam  (XANAX) 0.5 MG tablet    Sig: Take 1/2 t o1 tablet po QD prn    Dispense:  30 tablet    Refill:  2    Order Specific Question:   Supervising Provider    Answer:   Lavera Guise [[id number]]    Time spent: 5 Minutes      Dr Lavera Guise Internal medicine

## 2018-11-27 ENCOUNTER — Other Ambulatory Visit: Payer: Self-pay | Admitting: Adult Health

## 2018-11-27 DIAGNOSIS — M1991 Primary osteoarthritis, unspecified site: Secondary | ICD-10-CM

## 2018-11-28 NOTE — Telephone Encounter (Signed)
lov 11/07/18

## 2018-12-01 ENCOUNTER — Telehealth: Payer: Self-pay

## 2018-12-01 NOTE — Telephone Encounter (Signed)
Pt advised we send tramadol

## 2018-12-15 ENCOUNTER — Other Ambulatory Visit: Payer: Self-pay

## 2018-12-15 DIAGNOSIS — I10 Essential (primary) hypertension: Secondary | ICD-10-CM | POA: Diagnosis not present

## 2018-12-15 DIAGNOSIS — Z0001 Encounter for general adult medical examination with abnormal findings: Secondary | ICD-10-CM | POA: Diagnosis not present

## 2018-12-15 DIAGNOSIS — E559 Vitamin D deficiency, unspecified: Secondary | ICD-10-CM | POA: Diagnosis not present

## 2018-12-15 DIAGNOSIS — R7301 Impaired fasting glucose: Secondary | ICD-10-CM | POA: Diagnosis not present

## 2018-12-16 LAB — COMPREHENSIVE METABOLIC PANEL
ALT: 36 IU/L — ABNORMAL HIGH (ref 0–32)
AST: 27 IU/L (ref 0–40)
Albumin/Globulin Ratio: 2 (ref 1.2–2.2)
Albumin: 4.5 g/dL (ref 3.8–4.9)
Alkaline Phosphatase: 166 IU/L — ABNORMAL HIGH (ref 39–117)
BUN/Creatinine Ratio: 22 (ref 9–23)
BUN: 17 mg/dL (ref 6–24)
Bilirubin Total: 0.3 mg/dL (ref 0.0–1.2)
CO2: 24 mmol/L (ref 20–29)
Calcium: 10 mg/dL (ref 8.7–10.2)
Chloride: 99 mmol/L (ref 96–106)
Creatinine, Ser: 0.76 mg/dL (ref 0.57–1.00)
GFR calc Af Amer: 101 mL/min/{1.73_m2} (ref >59)
GFR calc non Af Amer: 87 mL/min/{1.73_m2} (ref >59)
Globulin, Total: 2.3 g/dL (ref 1.5–4.5)
Glucose: 142 mg/dL — ABNORMAL HIGH (ref 65–99)
Potassium: 4.3 mmol/L (ref 3.5–5.2)
Sodium: 141 mmol/L (ref 134–144)
Total Protein: 6.8 g/dL (ref 6.0–8.5)

## 2018-12-16 LAB — TSH: TSH: 1.56 u[IU]/mL (ref 0.450–4.500)

## 2018-12-16 LAB — T4, FREE: Free T4: 1.1 ng/dL (ref 0.82–1.77)

## 2018-12-16 LAB — VITAMIN D 25 HYDROXY (VIT D DEFICIENCY, FRACTURES): Vit D, 25-Hydroxy: 39.5 ng/mL (ref 30.0–100.0)

## 2018-12-16 LAB — CBC
Hematocrit: 40.8 % (ref 34.0–46.6)
Hemoglobin: 13.6 g/dL (ref 11.1–15.9)
MCH: 29.6 pg (ref 26.6–33.0)
MCHC: 33.3 g/dL (ref 31.5–35.7)
MCV: 89 fL (ref 79–97)
Platelets: 252 10*3/uL (ref 150–450)
RBC: 4.6 x10E6/uL (ref 3.77–5.28)
RDW: 13.4 % (ref 11.7–15.4)
WBC: 8.7 10*3/uL (ref 3.4–10.8)

## 2018-12-16 LAB — LIPID PANEL W/O CHOL/HDL RATIO
Cholesterol, Total: 241 mg/dL — ABNORMAL HIGH (ref 100–199)
HDL: 64 mg/dL (ref >39)
LDL Calculated: 143 mg/dL — ABNORMAL HIGH (ref 0–99)
Triglycerides: 168 mg/dL — ABNORMAL HIGH (ref 0–149)
VLDL Cholesterol Cal: 34 mg/dL (ref 5–40)

## 2018-12-16 LAB — HGB A1C W/O EAG: Hgb A1c MFr Bld: 6.9 % — ABNORMAL HIGH (ref 4.8–5.6)

## 2019-01-28 ENCOUNTER — Ambulatory Visit: Payer: BLUE CROSS/BLUE SHIELD | Admitting: Adult Health

## 2019-01-28 ENCOUNTER — Other Ambulatory Visit: Payer: Self-pay | Admitting: Internal Medicine

## 2019-01-28 ENCOUNTER — Other Ambulatory Visit: Payer: Self-pay

## 2019-01-28 ENCOUNTER — Other Ambulatory Visit: Payer: Self-pay | Admitting: Adult Health

## 2019-01-28 ENCOUNTER — Encounter: Payer: Self-pay | Admitting: Adult Health

## 2019-01-28 DIAGNOSIS — I10 Essential (primary) hypertension: Secondary | ICD-10-CM | POA: Diagnosis not present

## 2019-01-28 DIAGNOSIS — F411 Generalized anxiety disorder: Secondary | ICD-10-CM

## 2019-01-28 DIAGNOSIS — M1991 Primary osteoarthritis, unspecified site: Secondary | ICD-10-CM | POA: Diagnosis not present

## 2019-01-28 MED ORDER — TRAMADOL HCL 50 MG PO TABS: ORAL_TABLET | ORAL | 1 refills | Status: DC

## 2019-01-28 NOTE — Progress Notes (Signed)
[facility] [place], [place] [address]  Internal MEDICINE  Telephone Visit  Patient Name: Kristen Sparks  [id number]  [id number]  Date of Service: 01/28/2019  I connected with the patient at 449 by telephone and verified the patients identity using two identifiers.   I discussed the limitations, risks, security and privacy concerns of performing an evaluation and management service by telephone and the availability of in person appointments. I also discussed with the patient that there may be a patient responsible charge related to the service.  The patient expressed understanding and agrees to proceed.    Chief Complaint  Patient presents with  . Telephone Screen  . Telephone Assessment  . Medication Refill    tramadol    HPI  Pt is seen via telephone for tramadol refill. 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  She takes tramadol to control the pain at times.  She also uses antiinflammatories with some relief. She is requesting a refill on tramadol at this time.       Current Medication: Outpatient Encounter Medications as of 01/28/2019  Medication Sig  . albuterol (PROVENTIL HFA;VENTOLIN HFA) 108 (90 Base) MCG/ACT inhaler Inhale 2 puffs into the lungs every 6 (six) hours as needed for wheezing or shortness of breath.  . ALPRAZolam (XANAX) 0.5 MG tablet Take 1/2 t o1 tablet po QD prn  . benazepril (LOTENSIN) 10 MG tablet Take 1 tablet (10 mg total) by mouth daily.  . cyclobenzaprine (FLEXERIL) 10 MG tablet Take 1/2 to 1 tablet po Bid prn muscle pain  . hydrochlorothiazide (HYDRODIURIL) 12.5 MG tablet Take 1 tablet (12.5 mg total) by mouth daily.  Marland Kitchen omeprazole (PRILOSEC) 40 MG capsule TAKE ONE CAPSULE BY MOUTH EVERY MORNING THIRTY MINUTES PRIOR TO FOOD OR MEDICATIONS  . Prasterone (INTRAROSA) 6.5 MG INST Place 6.5 mg vaginally at bedtime.  . sertraline (ZOLOFT) 100 MG tablet TAKE 1 TABLET(100 MG) BY MOUTH DAILY  . traMADol (ULTRAM) 50 MG  tablet Take one tab po bid prn ony for severe pain   No facility-administered encounter medications on file as of 01/28/2019.     Surgical History: Past Surgical History:  Procedure Laterality Date  . CHOLECYSTECTOMY  2000  . colon polyectomy    . COLONOSCOPY WITH PROPOFOL N/A 03/31/2018   Procedure: COLONOSCOPY WITH PROPOFOL;  Surgeon: Jonathon Bellows, MD;  Location: [facility] ENDOSCOPY;  Service: Gastroenterology;  Laterality: N/A;  . FOOT SURGERY      Medical History: Past Medical History:  Diagnosis Date  . Anxiety   . Asthma   . Depression   . Environmental allergies   . Hyperlipidemia   . Hypertension   . Rheumatoid arthritis ([place])   . Sleep apnea 2019   Borderline  . Type 2 diabetes mellitus (H[facility])     Family History: Family History  Problem Relation Age of Onset  . Diabetes Mother   . COPD Mother   . Uterine cancer Mother   . Cancer Sister 46       Bile Duct    Social History   Socioeconomic History  . Marital status: Married    Spouse name: Not on file  . Number of children: Not on file  . Years of education: Not on file  . Highest education level: Not on file  Occupational History  . Not on file  Social Needs  . Financial resource strain: Not on file  . Food insecurity    Worry: Not on file  Inability: Not on file  . Transportation needs    Medical: Not on file    Non-medical: Not on file  Tobacco Use  . Smoking status: Never Smoker  . Smokeless tobacco: Never Used  Substance and Sexual Activity  . Alcohol use: Yes  . Drug use: No  . Sexual activity: Yes  Lifestyle  . Physical activity    Days per week: Not on file    Minutes per session: Not on file  . Stress: Not on file  Relationships  . Social Herbalist on phone: Not on file    Gets together: Not on file    Attends religious service: Not on file    Active member of club or organization: Not on file    Attends meetings of clubs or organizations: Not on file    Relationship  status: Not on file  . Intimate partner violence    Fear of current or ex partner: Not on file    Emotionally abused: Not on file    Physically abused: Not on file    Forced sexual activity: Not on file  Other Topics Concern  . Not on file  Social History Narrative  . Not on file      Review of Systems  Constitutional: Negative for chills, fatigue and unexpected weight change.  HENT: Negative for congestion, rhinorrhea, sneezing and sore throat.   Eyes: Negative for photophobia, pain and redness.  Respiratory: Negative for cough, chest tightness and shortness of breath.   Cardiovascular: Negative for chest pain and palpitations.  Gastrointestinal: Negative for abdominal pain, constipation, diarrhea, nausea and vomiting.  Endocrine: Negative.   Genitourinary: Negative for dysuria and frequency.  Musculoskeletal: Negative for arthralgias, back pain, joint swelling and neck pain.  Skin: Negative for rash.  Allergic/Immunologic: Negative.   Neurological: Negative for tremors and numbness.  Hematological: Negative for adenopathy. Does not bruise/bleed easily.  Psychiatric/Behavioral: Negative for behavioral problems and sleep disturbance. The patient is not nervous/anxious.     Vital Signs: There were no vitals taken for this visit.   Observation/Objective: Well sounding, NAD noted.     Assessment/Plan: 1. Primary localized osteoarthrosis Refilled patients tramadol Reviewed risks and possible side effects associated with taking opiates, benzodiazepines and other CNS depressants. Combination of these could cause dizziness and drowsiness. Advised patient not to drive or operate machinery when taking these medications, as patient's and other's life can be at risk and will have consequences. Patient verbalized understanding in this matter. Dependence and abuse for these drugs will be monitored closely. A Controlled substance policy and procedure is on file which allows [facility] to order a urine drug screen test at any visit. Patient understands and agrees with the plan - traMADol (ULTRAM) 50 MG tablet; Take one tab po bid prn ony for severe pain  Dispense: 60 tablet; Refill: 1  2. Essential (primary) hypertension No complaints, continue present management.   3. Morbid obesity ([place]) Obesity Counseling: Risk Assessment: An assessment of behavioral risk factors was made today and includes lack of exercise sedentary lifestyle, lack of portion control and poor dietary habits.  Risk Modification Advice: She was counseled on portion control guidelines. Restricting daily caloric intake to. . The detrimental long term effects of obesity on her health and ongoing poor compliance was also discussed with the patient.    General Counseling: [place] verbalizes understanding of the findings of today's phone visit and agrees with plan of treatment. I have discussed any further  diagnostic evaluation that may be needed or ordered today. We also reviewed her medications today. she has been encouraged to call the office with any questions or concerns that should arise related to todays visit.    No orders of the defined types were placed in this encounter.   No orders of the defined types were placed in this encounter.   Time spent: [place] [facility] Internal medicine

## 2019-02-04 ENCOUNTER — Emergency Department
Admission: EM | Admit: 2019-02-04 | Discharge: 2019-02-04 | Disposition: A | Discharge status: Home / Self Care | Payer: BC Managed Care – PPO | Attending: Emergency Medicine | Admitting: Emergency Medicine

## 2019-02-04 ENCOUNTER — Other Ambulatory Visit: Payer: Self-pay

## 2019-02-04 ENCOUNTER — Encounter: Payer: Self-pay | Admitting: Emergency Medicine

## 2019-02-04 DIAGNOSIS — R112 Nausea with vomiting, unspecified: Secondary | ICD-10-CM | POA: Diagnosis not present

## 2019-02-04 DIAGNOSIS — R109 Unspecified abdominal pain: Secondary | ICD-10-CM | POA: Diagnosis not present

## 2019-02-04 DIAGNOSIS — Z5321 Procedure and treatment not carried out due to patient leaving prior to being seen by health care provider: Secondary | ICD-10-CM | POA: Insufficient documentation

## 2019-02-04 LAB — CBC
HCT: 39.2 % (ref 36.0–46.0)
Hemoglobin: 13 g/dL (ref 12.0–15.0)
MCH: 30.2 pg (ref 26.0–34.0)
MCHC: 33.2 g/dL (ref 30.0–36.0)
MCV: 91.2 fL (ref 80.0–100.0)
Platelets: 267 10*3/uL (ref 150–400)
RBC: 4.3 MIL/uL (ref 3.87–5.11)
RDW: 13.7 % (ref 11.5–15.5)
WBC: 8.8 10*3/uL (ref 4.0–10.5)
nRBC: 0 % (ref 0.0–0.2)

## 2019-02-04 LAB — COMPREHENSIVE METABOLIC PANEL
ALT: 33 U/L (ref 0–44)
AST: 20 U/L (ref 15–41)
Albumin: 4.5 g/dL (ref 3.5–5.0)
Alkaline Phosphatase: 135 U/L — ABNORMAL HIGH (ref 38–126)
Anion gap: 12 (ref 5–15)
BUN: 21 mg/dL — ABNORMAL HIGH (ref 6–20)
CO2: 24 mmol/L (ref 22–32)
Calcium: 9.7 mg/dL (ref 8.9–10.3)
Chloride: 102 mmol/L (ref 98–111)
Creatinine, Ser: 0.51 mg/dL (ref 0.44–1.00)
GFR calc Af Amer: >60 mL/min (ref >60)
GFR calc non Af Amer: >60 mL/min (ref >60)
Glucose, Bld: 139 mg/dL — ABNORMAL HIGH (ref 70–99)
Potassium: 3.5 mmol/L (ref 3.5–5.1)
Sodium: 138 mmol/L (ref 135–145)
Total Bilirubin: 0.4 mg/dL (ref 0.3–1.2)
Total Protein: 7.9 g/dL (ref 6.5–8.1)

## 2019-02-04 LAB — URINALYSIS, COMPLETE (UACMP) WITH MICROSCOPIC
Bacteria, UA: NONE SEEN
Bilirubin Urine: NEGATIVE
Glucose, UA: NEGATIVE mg/dL
Hgb urine dipstick: NEGATIVE
Ketones, ur: NEGATIVE mg/dL
Leukocytes,Ua: NEGATIVE
Nitrite: NEGATIVE
Protein, ur: NEGATIVE mg/dL
Specific Gravity, Urine: 1.023 (ref 1.005–1.030)
pH: 5 (ref 5.0–8.0)

## 2019-02-04 LAB — LIPASE, BLOOD: Lipase: 49 U/L (ref 11–51)

## 2019-02-04 MED ORDER — SODIUM CHLORIDE 0.9% FLUSH: 3.0000 mL | Freq: Once | INTRAVENOUS | Status: DC

## 2019-02-04 NOTE — ED Triage Notes (Signed)
PT to ER states she has digestive issues and for the last few days has had left upper abdominal and left side pain with n/v.  Pt denies SHOB, states pain radiates into chest some.

## 2019-02-05 ENCOUNTER — Ambulatory Visit: Payer: BLUE CROSS/BLUE SHIELD | Admitting: Nurse Practitioner

## 2019-02-05 ENCOUNTER — Encounter: Payer: Self-pay | Admitting: Nurse Practitioner

## 2019-02-05 VITALS — BP 152/82 | HR 77 | Resp 16 | Ht 63.0 in | Wt 228.4 lb

## 2019-02-05 DIAGNOSIS — I10 Essential (primary) hypertension: Secondary | ICD-10-CM

## 2019-02-05 DIAGNOSIS — R110 Nausea: Secondary | ICD-10-CM | POA: Diagnosis not present

## 2019-02-05 DIAGNOSIS — K529 Noninfective gastroenteritis and colitis, unspecified: Secondary | ICD-10-CM | POA: Diagnosis not present

## 2019-02-05 DIAGNOSIS — E119 Type 2 diabetes mellitus without complications: Secondary | ICD-10-CM | POA: Diagnosis not present

## 2019-02-05 MED ORDER — METFORMIN HCL 500 MG PO TABS: 250.0000 mg | ORAL_TABLET | Freq: Two times a day (BID) | ORAL | 3 refills | Status: DC

## 2019-02-05 MED ORDER — ONDANSETRON HCL 4 MG PO TABS: 4.0000 mg | ORAL_TABLET | Freq: Three times a day (TID) | ORAL | 0 refills | Status: DC | PRN

## 2019-02-05 MED ORDER — CIPROFLOXACIN HCL 500 MG PO TABS: 500.0000 mg | ORAL_TABLET | Freq: Two times a day (BID) | ORAL | 0 refills | Status: DC

## 2019-02-05 NOTE — Patient Instructions (Signed)

## 2019-02-05 NOTE — Progress Notes (Signed)
[facility] [place], [place] [address]  Internal MEDICINE  Office Visit Note  Patient Name: Kristen Sparks  [id number]  [id number]  Date of Service: 02/08/2019   Pt is here for a sick visit.  Chief Complaint  Patient presents with  . Memory Loss    pt has been having short term memory  . Fatigue  . Pain    goes from left side of armpit down to back, dull pain that comes and goes  . Edema    looks swollen on left side  . Constipation    bowel movements have been light  . Migraine    here and there  . Abdominal Pain    also feels like she has a lot of gas  . Gastroesophageal Reflux    started since eating banana pudding, unable to eat peanut butter  . Vomiting    happened monday night  . Nausea  . Results    went to er last night and pt has urine results and labs     The patient is here for sick visit. She has left sided upper and lower abdominal pain. Feels very tired. Has had episodes of abdominal cramping with some nausea. Had single episode of vomiting two days ago. She feels sweaty and shaky. She did go to the ER the other day as pain in her abdomen was so severe. She had labs and u/a done. Her HgbA1c is elevated at 6.9. urine did not indicate infection. She did not stay long enough to be seen by provider in ER.         Current Medication:  Outpatient Encounter Medications as of 02/05/2019  Medication Sig  . albuterol (PROVENTIL HFA;VENTOLIN HFA) 108 (90 Base) MCG/ACT inhaler Inhale 2 puffs into the lungs every 6 (six) hours as needed for wheezing or shortness of breath.  . ALPRAZolam (XANAX) 0.5 MG tablet Take 1/2 t o1 tablet po QD prn  . benazepril (LOTENSIN) 10 MG tablet Take 1 tablet (10 mg total) by mouth daily.  . cyclobenzaprine (FLEXERIL) 10 MG tablet Take 1/2 to 1 tablet po Bid prn muscle pain  . hydrochlorothiazide (HYDRODIURIL) 12.5 MG tablet Take 1 tablet (12.5 mg total) by mouth daily.  Marland Kitchen omeprazole (PRILOSEC) 40 MG capsule  TAKE ONE CAPSULE BY MOUTH EVERY MORNING THIRTY MINUTES PRIOR TO FOOD OR MEDICATIONS  . Prasterone (INTRAROSA) 6.5 MG INST Place 6.5 mg vaginally at bedtime.  . sertraline (ZOLOFT) 100 MG tablet TAKE 1 TABLET(100 MG) BY MOUTH DAILY  . traMADol (ULTRAM) 50 MG tablet Take one tab po bid prn ony for severe pain  . ciprofloxacin (CIPRO) 500 MG tablet Take 1 tablet (500 mg total) by mouth 2 (two) times daily.  . metFORMIN (GLUCOPHAGE) 500 MG tablet Take 0.5 tablets (250 mg total) by mouth 2 (two) times daily with a meal.  . ondansetron (ZOFRAN) 4 MG tablet Take 1 tablet (4 mg total) by mouth every 8 (eight) hours as needed for nausea or vomiting.   No facility-administered encounter medications on file as of 02/05/2019.       Medical History: Past Medical History:  Diagnosis Date  . Anxiety   . Asthma   . Depression   . Environmental allergies   . Hyperlipidemia   . Hypertension   . Rheumatoid arthritis ([place])   . Sleep apnea 2019   Borderline  . Type 2 diabetes mellitus ([place])      Today's Vitals   02/05/19 1145  BP: (!) 152/82  Pulse: 77  Resp: 16  SpO2: 96%  Weight: 228 lb 6.4 oz (103.6 kg)  Height: 5\' 3"  (1.6 m)   Body mass index is 40.46 kg/m.  Review of Systems  Constitutional: Positive for activity change, diaphoresis and fatigue. Negative for chills and unexpected weight change.  HENT: Negative for congestion, postnasal drip, rhinorrhea, sneezing and sore throat.   Respiratory: Negative for cough, chest tightness and shortness of breath.   Cardiovascular: Negative for chest pain and palpitations.       Blood pressure a little elevated today.   Gastrointestinal: Positive for abdominal pain, blood in stool, constipation, diarrhea, nausea and vomiting.  Endocrine: Positive for heat intolerance, polydipsia and polyuria.       Lab check in ER was 6.9   Genitourinary: Positive for frequency and urgency. Negative for dysuria.  Musculoskeletal: Positive for arthralgias and  myalgias. Negative for back pain, joint swelling and neck pain.  Skin: Negative for rash.  Neurological: Positive for dizziness and headaches. Negative for tremors and numbness.  Hematological: Negative for adenopathy. Does not bruise/bleed easily.  Psychiatric/Behavioral: Negative for behavioral problems (Depression), sleep disturbance and suicidal ideas. The patient is not nervous/anxious.     Physical Exam Vitals signs and nursing note reviewed.  Constitutional:      General: She is not in acute distress.    Appearance: She is well-developed. She is ill-appearing. She is not diaphoretic.  HENT:     Head: Normocephalic and atraumatic.     Mouth/Throat:     Pharynx: No oropharyngeal exudate.  Eyes:     Pupils: Pupils are equal, round, and reactive to light.  Neck:     Musculoskeletal: Normal range of motion and neck supple.     Thyroid: No thyromegaly.     Vascular: No JVD.     Trachea: No tracheal deviation.  Cardiovascular:     Rate and Rhythm: Normal rate and regular rhythm.     Heart sounds: Normal heart sounds. No murmur. No friction rub. No gallop.   Pulmonary:     Effort: Pulmonary effort is normal. No respiratory distress.     Breath sounds: Normal breath sounds. No wheezing or rales.  Chest:     Chest wall: No tenderness.  Abdominal:     General: Bowel sounds are normal.     Palpations: Abdomen is soft.     Tenderness: There is abdominal tenderness.     Comments: There is mild to moderate tenderness over the left upper and lower quadrant of the abdomen.   Musculoskeletal: Normal range of motion.  Lymphadenopathy:     Cervical: No cervical adenopathy.  Skin:    General: Skin is warm and dry.  Neurological:     Mental Status: She is alert and oriented to person, place, and time.     Cranial Nerves: No cranial nerve deficit.  Psychiatric:        Behavior: Behavior normal.        Thought Content: Thought content normal.        Judgment: Judgment normal.    Assessment/Plan: 1. Diabetes mellitus without complication ([place]) Recent check of HgbA1c 6.9. start metformin 250mg  twice daily. Long discussion about diabetic diet and written information provided. Advised she check her blood sugars every morning, prior to eating. Goal is to keep morning blood sugars between 70 and 120.  - metFORMIN (GLUCOPHAGE) 500 MG tablet; Take 0.5 tablets (250 mg total) by mouth 2 (two) times daily with a meal.  Dispense: 90 tablet; Refill: 3  2. Gastroenteritis Start cipro 500mg  twice daily for 7 days. [place] diet recommended. Advance as tolerated. Will get abdominal u/s or CT abdomen and pelvis if no improvement in symptoms.  - ciprofloxacin (CIPRO) 500 MG tablet; Take 1 tablet (500 mg total) by mouth 2 (two) times daily.  Dispense: 14 tablet; Refill: 0  3. Nausea zofran may be taken as needed and as prescribed. BRAT/bland diet recommended. Advance as tolerated.  - ondansetron (ZOFRAN) 4 MG tablet; Take 1 tablet (4 mg total) by mouth every 8 (eight) hours as needed for nausea or vomiting.  Dispense: 30 tablet; Refill: 0  4. Essential (primary) hypertension Generally stable. Continue blood pressure medication as prescribed   General Counseling: [place] verbalizes understanding of the findings of todays visit and agrees with plan of treatment. I have discussed any further diagnostic evaluation that may be needed or ordered today. We also reviewed her medications today. she has been encouraged to call the office with any questions or concerns that should arise related to todays visit.    Counseling:  Diabetes Counseling:  1. Addition of ACE inh/ ARB'S for nephroprotection. Microalbumin is updated  2. Diabetic foot care, prevention of complications. Podiatry consult 3. Exercise and lose weight.  4. Diabetic eye examination, Diabetic eye exam is updated  5. Monitor blood sugar closlely. nutrition counseling.  6. Sign and symptoms of hypoglycemia including shaking  sweating,confusion and headaches.  This patient was seen by [place] with Dr Lavera Guise as a part of collaborative care agreement  Meds ordered this encounter  Medications  . metFORMIN (GLUCOPHAGE) 500 MG tablet    Sig: Take 0.5 tablets (250 mg total) by mouth 2 (two) times daily with a meal.    Dispense:  90 tablet    Refill:  3    Order Specific Question:   Supervising Provider    Answer:   Lavera Guise [place]  . ciprofloxacin (CIPRO) 500 MG tablet    Sig: Take 1 tablet (500 mg total) by mouth 2 (two) times daily.    Dispense:  14 tablet    Refill:  0    Order Specific Question:   Supervising Provider    Answer:   Lavera Guise [[id number]]  . ondansetron (ZOFRAN) 4 MG tablet    Sig: Take 1 tablet (4 mg total) by mouth every 8 (eight) hours as needed for nausea or vomiting.    Dispense:  30 tablet    Refill:  0    Order Specific Question:   Supervising Provider    Answer:   Lavera Guise [[id number]]    Time spent: 45 Minutes

## 2019-02-08 DIAGNOSIS — E119 Type 2 diabetes mellitus without complications: Secondary | ICD-10-CM | POA: Insufficient documentation

## 2019-02-08 DIAGNOSIS — K529 Noninfective gastroenteritis and colitis, unspecified: Secondary | ICD-10-CM | POA: Insufficient documentation

## 2019-02-08 DIAGNOSIS — R110 Nausea: Secondary | ICD-10-CM | POA: Insufficient documentation

## 2019-02-12 ENCOUNTER — Other Ambulatory Visit: Payer: Self-pay | Admitting: Nurse Practitioner

## 2019-02-12 DIAGNOSIS — L209 Atopic dermatitis, unspecified: Secondary | ICD-10-CM

## 2019-02-12 MED ORDER — METHYLPREDNISOLONE 4 MG PO TBPK: ORAL_TABLET | ORAL | 0 refills | Status: DC

## 2019-02-12 NOTE — Progress Notes (Signed)
Added medrol dose pack to take as directed for 6 days. She is gong to need to launder all clothing and linens in hot water, potentially, several times to get rid of bed bugs.

## 2019-02-13 ENCOUNTER — Ambulatory Visit: Payer: BLUE CROSS/BLUE SHIELD | Admitting: Nurse Practitioner

## 2019-03-05 ENCOUNTER — Ambulatory Visit: Payer: BLUE CROSS/BLUE SHIELD | Admitting: Nurse Practitioner

## 2019-03-24 ENCOUNTER — Other Ambulatory Visit: Payer: Self-pay

## 2019-03-24 ENCOUNTER — Ambulatory Visit (INDEPENDENT_AMBULATORY_CARE_PROVIDER_SITE_OTHER): Payer: BC Managed Care – PPO | Admitting: Obstetrics and Gynecology

## 2019-03-24 ENCOUNTER — Encounter: Payer: Self-pay | Admitting: Obstetrics and Gynecology

## 2019-03-24 VITALS — BP 114/72 | HR 72 | Ht 65.0 in | Wt 226.0 lb

## 2019-03-24 DIAGNOSIS — Z124 Encounter for screening for malignant neoplasm of cervix: Secondary | ICD-10-CM

## 2019-03-24 DIAGNOSIS — Z01419 Encounter for gynecological examination (general) (routine) without abnormal findings: Secondary | ICD-10-CM | POA: Diagnosis not present

## 2019-03-24 DIAGNOSIS — Z1239 Encounter for other screening for malignant neoplasm of breast: Secondary | ICD-10-CM

## 2019-03-24 NOTE — Patient Instructions (Signed)
[facility] [address] [place] [address]  [facility]  [address] [place] [address]  Phone: [phone]  

## 2019-03-24 NOTE — Progress Notes (Signed)
Gynecology Annual Exam  PCP: Lavera Guise, MD  Chief Complaint:  Chief Complaint  Patient presents with  . Gynecologic Exam    History of Present Illness:Patient is a 58 y.o. G2P1011 presents for annual exam. The patient has no complaints today.   LMP: No LMP recorded. Patient is postmenopausal. Postmenopausal  The patient is sexually active. She denies dyspareunia.  The patient does perform self breast exams.  There is no notable family history of breast or ovarian cancer in her family.  Mom with uterine cancer.  The patient wears seatbelts: yes.   The patient has regular exercise: not asked.    The patient denies current symptoms of depression.     Review of Systems: Review of Systems  Constitutional: Negative for chills and fever.  HENT: Negative for congestion.   Respiratory: Negative for cough and shortness of breath.   Cardiovascular: Negative for chest pain and palpitations.  Gastrointestinal: Positive for constipation, diarrhea and nausea. Negative for abdominal pain, heartburn and vomiting.  Genitourinary: Negative for dysuria, frequency and urgency.  Skin: Negative for itching and rash.  Neurological: Negative for dizziness and headaches.  Endo/Heme/Allergies: Negative for polydipsia.  Psychiatric/Behavioral: Negative for depression.    Past Medical History:  Past Medical History:  Diagnosis Date  . Anxiety   . Asthma   . Depression   . Environmental allergies   . Hyperlipidemia   . Hypertension   . Rheumatoid arthritis ([place])   . Sleep apnea 2019   Borderline  . Type 2 diabetes mellitus ([place])     Past Surgical History:  Past Surgical History:  Procedure Laterality Date  . CHOLECYSTECTOMY  2000  . colon polyectomy    . COLONOSCOPY WITH PROPOFOL N/A 03/31/2018   Procedure: COLONOSCOPY WITH PROPOFOL;  Surgeon: Jonathon Bellows, MD;  Location: [facility] ENDOSCOPY;  Service: Gastroenterology;  Laterality: N/A;  . FOOT SURGERY      Gynecologic History:  No  LMP recorded. Patient is postmenopausal. Last Pap: Results were:  03/20/2018 NIL and HR HPV+  10/14/2014 NIL and HR HPV negative  Last mammogram: 10/14/2014 Results were: BI-RAD I   GYN Ultrasound 08/15/2015 Posterior intramural fibroid 20.44 x 22.10 x 20.41mm, anterior left intramural 20.06 x 21.65 x 24.77mm with EMS of 3.26mm  Obstetric History: G2P1011  Family History:  Family History  Problem Relation Age of Onset  . Diabetes Mother   . COPD Mother   . Uterine cancer Mother 31  . Cancer Sister 82       Bile Duct    Social History:  Social History   Socioeconomic History  . Marital status: Married    Spouse name: Not on file  . Number of children: Not on file  . Years of education: Not on file  . Highest education level: Not on file  Occupational History  . Not on file  Social Needs  . Financial resource strain: Not on file  . Food insecurity    Worry: Not on file    Inability: Not on file  . Transportation needs    Medical: Not on file    Non-medical: Not on file  Tobacco Use  . Smoking status: Never Smoker  . Smokeless tobacco: Never Used  Substance and Sexual Activity  . Alcohol use: Yes  . Drug use: No  . Sexual activity: Yes    Birth control/protection: Post-menopausal  Lifestyle  . Physical activity    Days per week: Not on file    Minutes per  session: Not on file  . Stress: Not on file  Relationships  . Social Herbalist on phone: Not on file    Gets together: Not on file    Attends religious service: Not on file    Active member of club or organization: Not on file    Attends meetings of clubs or organizations: Not on file    Relationship status: Not on file  . Intimate partner violence    Fear of current or ex partner: Not on file    Emotionally abused: Not on file    Physically abused: Not on file    Forced sexual activity: Not on file  Other Topics Concern  . Not on file  Social History Narrative  . Not on file    Allergies:   Allergies  Allergen Reactions  . Azithromycin Hives  . Contrast Media [Iodinated Diagnostic Agents]   . Penicillin G Rash    Medications: Prior to Admission medications   Medication Sig Start Date End Date Taking? Authorizing Provider  albuterol (PROVENTIL HFA;VENTOLIN HFA) 108 (90 Base) MCG/ACT inhaler Inhale 2 puffs into the lungs every 6 (six) hours as needed for wheezing or shortness of breath. 07/09/18   Ronnell Freshwater, NP  ALPRAZolam Duanne Moron) 0.5 MG tablet Take 1/2 t o1 tablet po QD prn 11/07/18   Ronnell Freshwater, NP  benazepril (LOTENSIN) 10 MG tablet Take 1 tablet (10 mg total) by mouth daily. 11/07/18   Ronnell Freshwater, NP  ciprofloxacin (CIPRO) 500 MG tablet Take 1 tablet (500 mg total) by mouth 2 (two) times daily. 02/05/19   Ronnell Freshwater, NP  cyclobenzaprine (FLEXERIL) 10 MG tablet Take 1/2 to 1 tablet po Bid prn muscle pain 09/22/18   Leretha Pol E, NP  hydrochlorothiazide (HYDRODIURIL) 12.5 MG tablet Take 1 tablet (12.5 mg total) by mouth daily. 11/07/18   Ronnell Freshwater, NP  metFORMIN (GLUCOPHAGE) 500 MG tablet Take 0.5 tablets (250 mg total) by mouth 2 (two) times daily with a meal. 02/05/19   Ronnell Freshwater, NP  methylPREDNISolone (MEDROL) 4 MG TBPK tablet Take by mouth as directed for 6 days 02/12/19   Ronnell Freshwater, NP  omeprazole (PRILOSEC) 40 MG capsule TAKE ONE CAPSULE BY MOUTH EVERY MORNING THIRTY MINUTES PRIOR TO FOOD OR MEDICATIONS 02/26/18   Lavera Guise, MD  ondansetron (ZOFRAN) 4 MG tablet Take 1 tablet (4 mg total) by mouth every 8 (eight) hours as needed for nausea or vomiting. 02/05/19   Ronnell Freshwater, NP  Prasterone (INTRAROSA) 6.5 MG INST Place 6.5 mg vaginally at bedtime. 03/20/18   Malachy Mood, MD  sertraline (ZOLOFT) 100 MG tablet TAKE 1 TABLET(100 MG) BY MOUTH DAILY 01/29/19   Ronnell Freshwater, NP  traMADol (ULTRAM) 50 MG tablet Take one tab po bid prn ony for severe pain 01/28/19   Kendell Bane, NP    Physical Exam Vitals:  Blood pressure 114/72, pulse 72, height 5\' 5"  (1.651 m), weight 226 lb (102.5 kg).  General: NAD HEENT: normocephalic, anicteric Thyroid: no enlargement, no palpable nodules Pulmonary: No increased work of breathing, CTAB Cardiovascular: RRR, distal pulses 2+ Breast: Breast symmetrical, no tenderness, no palpable nodules or masses, no skin or nipple retraction present, no nipple discharge.  No axillary or supraclavicular lymphadenopathy. Abdomen: NABS, soft, non-tender, non-distended.  Umbilicus without lesions.  No hepatomegaly, splenomegaly or masses palpable. No evidence of hernia  Genitourinary:  External: Normal external female genitalia.  Normal urethral meatus,  normal Bartholin's and Skene's glands.    Vagina: Normal vaginal mucosa, no evidence of prolapse.    Cervix: Grossly normal in appearance, no bleeding  Uterus: Non-enlarged, mobile, normal contour.  No CMT  Adnexa: ovaries non-enlarged, no adnexal masses  Rectal: deferred  Lymphatic: no evidence of inguinal lymphadenopathy Extremities: no edema, erythema, or tenderness Neurologic: Grossly intact Psychiatric: mood appropriate, affect full  Female chaperone present for pelvic and breast  portions of the physical exam     Assessment: 58 y.o. G2P1011 routine annual exam  Plan: Problem List Items Addressed This Visit    None    Visit Diagnoses    Encounter for gynecological examination without abnormal finding    -  Primary   Screening for malignant neoplasm of cervix       Breast screening       Relevant Orders   MM 3D SCREEN BREAST BILATERAL      1) Mammogram - recommend yearly screening mammogram.  Mammogram Was ordered today  2) STI screening  was notoffered and therefore not obtained  3) ASCCP guidelines and rational discussed.  Patient opts for every 3 years screening interval  4) Osteoporosis  - per USPTF routine screening DEXA at age 28  5) Routine healthcare maintenance including cholesterol,  diabetes screening discussed managed by PCP   7) Return in about 1 year (around 03/23/2020) for annual.    Malachy Mood, MD Mosetta Pigeon, [facility] 03/24/2019, 8:46 AM

## 2019-03-26 ENCOUNTER — Encounter: Payer: Self-pay | Admitting: Nurse Practitioner

## 2019-03-26 ENCOUNTER — Ambulatory Visit: Payer: BC Managed Care – PPO | Admitting: Nurse Practitioner

## 2019-03-26 ENCOUNTER — Other Ambulatory Visit: Payer: Self-pay

## 2019-03-26 VITALS — Ht 65.0 in | Wt 226.0 lb

## 2019-03-26 DIAGNOSIS — E119 Type 2 diabetes mellitus without complications: Secondary | ICD-10-CM

## 2019-03-26 DIAGNOSIS — M1991 Primary osteoarthritis, unspecified site: Secondary | ICD-10-CM | POA: Diagnosis not present

## 2019-03-26 MED ORDER — TRAMADOL HCL 50 MG PO TABS: ORAL_TABLET | ORAL | 1 refills | Status: DC

## 2019-03-26 NOTE — Progress Notes (Signed)
[facility] [place], [place] [address]  Internal MEDICINE  Telephone Visit  Patient Name: Kristen Sparks  [id number]  [id number]  Date of Service: 03/26/2019  I connected with the patient at 12:32pm by webcam and verified the patients identity using two identifiers.   I discussed the limitations, risks, security and privacy concerns of performing an evaluation and management service by webcam and the availability of in person appointments. I also discussed with the patient that there may be a patient responsible charge related to the service.  The patient expressed understanding and agrees to proceed.    No chief complaint on file.   The patient has been contacted via webcam for follow up visit due to concerns for spread of novel coronavirus. The patient presents for follow up. She would like to have refill of her tramadol. She has spurring of C4 and C5 in her neck. She will take one in the morning and one in the evening when needed. She has no negative side effects associated with taking this medication. She does not get sleepy or dizzy. She has no other concerns or complaints today.       Current Medication: Outpatient Encounter Medications as of 03/26/2019  Medication Sig Note  . ALPRAZolam (XANAX) 0.5 MG tablet Take 1/2 t o1 tablet po QD prn 03/24/2019: As needed   . sertraline (ZOLOFT) 100 MG tablet TAKE 1 TABLET(100 MG) BY MOUTH DAILY   . traMADol (ULTRAM) 50 MG tablet Take one tab po bid prn ony for severe pain   . [DISCONTINUED] traMADol (ULTRAM) 50 MG tablet Take one tab po bid prn ony for severe pain    No facility-administered encounter medications on file as of 03/26/2019.     Surgical History: Past Surgical History:  Procedure Laterality Date  . CHOLECYSTECTOMY  2000  . colon polyectomy    . COLONOSCOPY WITH PROPOFOL N/A 03/31/2018   Procedure: COLONOSCOPY WITH PROPOFOL;  Surgeon: Jonathon Bellows, MD;  Location: [facility] ENDOSCOPY;  Service:  Gastroenterology;  Laterality: N/A;  . FOOT SURGERY      Medical History: Past Medical History:  Diagnosis Date  . Anxiety   . Asthma   . Depression   . Environmental allergies   . Hyperlipidemia   . Hypertension   . Rheumatoid arthritis ([place])   . Sleep apnea 2019   Borderline  . Type 2 diabetes mellitus ([facility])     Family History: Family History  Problem Relation Age of Onset  . Diabetes Mother   . COPD Mother   . Uterine cancer Mother 34  . Cancer Sister 52       Bile Duct    Social History   Socioeconomic History  . Marital status: Married    Spouse name: Not on file  . Number of children: Not on file  . Years of education: Not on file  . Highest education level: Not on file  Occupational History  . Not on file  Social Needs  . Financial resource strain: Not on file  . Food insecurity    Worry: Not on file    Inability: Not on file  . Transportation needs    Medical: Not on file    Non-medical: Not on file  Tobacco Use  . Smoking status: Never Smoker  . Smokeless tobacco: Never Used  Substance and Sexual Activity  . Alcohol use: Yes  . Drug use: No  . Sexual activity: Yes    Birth control/protection: Post-menopausal  Lifestyle  .  Physical activity    Days per week: Not on file    Minutes per session: Not on file  . Stress: Not on file  Relationships  . Social Herbalist on phone: Not on file    Gets together: Not on file    Attends religious service: Not on file    Active member of club or organization: Not on file    Attends meetings of clubs or organizations: Not on file    Relationship status: Not on file  . Intimate partner violence    Fear of current or ex partner: Not on file    Emotionally abused: Not on file    Physically abused: Not on file    Forced sexual activity: Not on file  Other Topics Concern  . Not on file  Social History Narrative  . Not on file      Review of Systems  Constitutional: Negative for chills,  fatigue and unexpected weight change.  HENT: Negative for congestion, rhinorrhea, sneezing and sore throat.   Respiratory: Negative for cough, chest tightness and shortness of breath.   Cardiovascular: Negative for chest pain and palpitations.  Gastrointestinal: Negative for abdominal pain, constipation, diarrhea, nausea and vomiting.  Musculoskeletal: Positive for neck pain and neck stiffness.       She has degenerative disc disease with spurring in vertebrae of her neck. Cause pain which radiates into her shoulders and arms.   Skin: Negative for rash.  Allergic/Immunologic: Negative.   Neurological: Negative for tremors and numbness.  Hematological: Negative for adenopathy. Does not bruise/bleed easily.  Psychiatric/Behavioral: Negative for behavioral problems and sleep disturbance. The patient is not nervous/anxious.     Today's Vitals   03/26/19 1011  Weight: 226 lb (102.5 kg)  Height: 5\' 5"  (1.651 m)   Body mass index is 37.61 kg/m.  Observation/Objective:    The patient is alert and oriented. She is pleasant and answers all questions appropriately. Breathing is non-labored. She is in no acute distress at this time.    Assessment/Plan: 1. Diabetes mellitus without complication ([place]) Continue to control through diet and exercise. Check HgbA1c at next visit   2. Primary localized osteoarthrosis May take tramadol 50mg  up to twice daily if needed for pain. New prescription sent to her pharmacy.  - traMADol (ULTRAM) 50 MG tablet; Take one tab po bid prn ony for severe pain  Dispense: 60 tablet; Refill: 1  General Counseling: Teruko verbalizes understanding of the findings of today's phone visit and agrees with plan of treatment. I have discussed any further diagnostic evaluation that may be needed or ordered today. We also reviewed her medications today. she has been encouraged to call the office with any questions or concerns that should arise related to todays visit.  Reviewed  risks and possible side effects associated with taking opiates, benzodiazepines and other CNS depressants. Combination of these could cause dizziness and drowsiness. Advised patient not to drive or operate machinery when taking these medications, as patient's and other's life can be at risk and will have consequences. Patient verbalized understanding in this matter. Dependence and abuse for these drugs will be monitored closely. A Controlled substance policy and procedure is on file which allows [facility] to order a urine drug screen test at any visit. Patient understands and agrees with the plan  This patient was seen by Leretha Pol FNP Collaboration with Dr Lavera Guise as a part of collaborative care agreement  Meds ordered this encounter  Medications  . traMADol (ULTRAM) 50 MG tablet    Sig: Take one tab po bid prn ony for severe pain    Dispense:  60 tablet    Refill:  1    Order Specific Question:   Supervising Provider    Answer:   Lavera Guise [id number]    Time spent: 4 Minutes    Dr Lavera Guise Internal medicine

## 2019-04-07 ENCOUNTER — Other Ambulatory Visit: Payer: Self-pay

## 2019-04-07 DIAGNOSIS — Z20828 Contact with and (suspected) exposure to other viral communicable diseases: Secondary | ICD-10-CM | POA: Diagnosis not present

## 2019-04-07 DIAGNOSIS — Z20822 Contact with and (suspected) exposure to covid-19: Secondary | ICD-10-CM

## 2019-04-09 LAB — NOVEL CORONAVIRUS, NAA: SARS-CoV-2, NAA: NOT DETECTED

## 2019-04-27 ENCOUNTER — Other Ambulatory Visit: Payer: Self-pay | Admitting: Nurse Practitioner

## 2019-04-27 MED ORDER — OMEPRAZOLE 40 MG PO CPDR: 40.0000 mg | DELAYED_RELEASE_CAPSULE | Freq: Every day | ORAL | 1 refills | Status: DC

## 2019-04-29 ENCOUNTER — Other Ambulatory Visit: Payer: Self-pay | Admitting: Adult Health

## 2019-04-29 ENCOUNTER — Other Ambulatory Visit: Payer: Self-pay | Admitting: Nurse Practitioner

## 2019-04-29 DIAGNOSIS — F411 Generalized anxiety disorder: Secondary | ICD-10-CM

## 2019-04-29 DIAGNOSIS — M1991 Primary osteoarthritis, unspecified site: Secondary | ICD-10-CM

## 2019-04-29 MED ORDER — ALPRAZOLAM 0.5 MG PO TABS: ORAL_TABLET | ORAL | 2 refills | Status: DC

## 2019-04-29 NOTE — Progress Notes (Signed)
Renewed prescription for alprazolam per pharamcy request.

## 2019-05-20 ENCOUNTER — Telehealth: Payer: Self-pay

## 2019-05-20 NOTE — Telephone Encounter (Signed)
LMOM TO CONFIRM AND SCREEN FOR 05-26-19 OV.

## 2019-05-20 NOTE — Telephone Encounter (Signed)
CONFIRMED AND SCREENED FOR 05-26-19 OV. °

## 2019-05-25 ENCOUNTER — Telehealth: Payer: Self-pay

## 2019-05-25 NOTE — Telephone Encounter (Signed)
CONFIRMED 05-26-19 AS A VIRTUAL VISIT.

## 2019-05-26 ENCOUNTER — Other Ambulatory Visit: Payer: Self-pay

## 2019-05-26 ENCOUNTER — Encounter: Payer: Self-pay | Admitting: Internal Medicine

## 2019-05-26 ENCOUNTER — Ambulatory Visit: Payer: BC Managed Care – PPO | Admitting: Internal Medicine

## 2019-05-26 DIAGNOSIS — F411 Generalized anxiety disorder: Secondary | ICD-10-CM

## 2019-05-26 DIAGNOSIS — I10 Essential (primary) hypertension: Secondary | ICD-10-CM | POA: Diagnosis not present

## 2019-05-26 DIAGNOSIS — M1991 Primary osteoarthritis, unspecified site: Secondary | ICD-10-CM

## 2019-05-26 MED ORDER — TRAMADOL HCL 50 MG PO TABS: ORAL_TABLET | ORAL | 1 refills | Status: DC

## 2019-05-26 MED ORDER — ALPRAZOLAM 0.5 MG PO TABS: ORAL_TABLET | ORAL | 1 refills | Status: DC

## 2019-05-26 NOTE — Progress Notes (Signed)
[facility] [place], [place] [address]  Internal MEDICINE  Telephone Visit  Patient Name: Kristen Sparks  [id number]  [id number]  Date of Service: 05/26/2019  I connected with the patient at 1010 by Video/telephone and verified the patients identity using two identifiers.   I discussed the limitations, risks, security and privacy concerns of performing an evaluation and management service by telephone and the availability of in person appointments. I also discussed with the patient that there may be a patient responsible charge related to the service.  The patient expressed understanding and agrees to proceed.    Chief Complaint  Patient presents with  . Telephone Screen  . Telephone Assessment  . Medical Management of Chronic Issues    2 MONTH FOLLOW UP , NEEDING TRAMADOL   . Diabetes  . Hyperlipidemia  . Hypertension  . Anxiety    MEDICATION REFILL XANAX , NEEDS TO BE SENT TO A DIFFERENT PHARMACY THEN [facility]    HPI  Pt is connected via virtual visit. She feels well, no new complaints. Will like to get refills on her medications  Current Medication: Outpatient Encounter Medications as of 05/26/2019  Medication Sig  . ALPRAZolam (XANAX) 0.5 MG tablet Take 1/2 t o1 tablet po QD prn  . omeprazole (PRILOSEC) 40 MG capsule Take 1 capsule (40 mg total) by mouth daily.  . sertraline (ZOLOFT) 100 MG tablet TAKE 1 TABLET(100 MG) BY MOUTH DAILY  . traMADol (ULTRAM) 50 MG tablet TAKE 1 TABLET BY MOUTH TWICE DAILY AS NEEDED ONLY FOR SEVERE PAIN   No facility-administered encounter medications on file as of 05/26/2019.     Surgical History: Past Surgical History:  Procedure Laterality Date  . CHOLECYSTECTOMY  2000  . colon polyectomy    . COLONOSCOPY WITH PROPOFOL N/A 03/31/2018   Procedure: COLONOSCOPY WITH PROPOFOL;  Surgeon: Jonathon Bellows, MD;  Location: [facility] ENDOSCOPY;  Service: Gastroenterology;  Laterality: N/A;  . FOOT SURGERY      Medical  History: Past Medical History:  Diagnosis Date  . Anxiety   . Asthma   . Depression   . Environmental allergies   . Hyperlipidemia   . Hypertension   . Rheumatoid arthritis ([place])   . Sleep apnea 2019   Borderline  . Type 2 diabetes mellitus ([facility])     Family History: Family History  Problem Relation Age of Onset  . Diabetes Mother   . COPD Mother   . Uterine cancer Mother 11  . Cancer Sister 37       Bile Duct    Social History   Socioeconomic History  . Marital status: Married    Spouse name: Not on file  . Number of children: Not on file  . Years of education: Not on file  . Highest education level: Not on file  Occupational History  . Not on file  Social Needs  . Financial resource strain: Not on file  . Food insecurity    Worry: Not on file    Inability: Not on file  . Transportation needs    Medical: Not on file    Non-medical: Not on file  Tobacco Use  . Smoking status: Never Smoker  . Smokeless tobacco: Never Used  Substance and Sexual Activity  . Alcohol use: Yes  . Drug use: No  . Sexual activity: Yes    Birth control/protection: Post-menopausal  Lifestyle  . Physical activity    Days per week: Not on file    Minutes  per session: Not on file  . Stress: Not on file  Relationships  . Social Herbalist on phone: Not on file    Gets together: Not on file    Attends religious service: Not on file    Active member of club or organization: Not on file    Attends meetings of clubs or organizations: Not on file    Relationship status: Not on file  . Intimate partner violence    Fear of current or ex partner: Not on file    Emotionally abused: Not on file    Physically abused: Not on file    Forced sexual activity: Not on file  Other Topics Concern  . Not on file  Social History Narrative  . Not on file   Review of Systems  Constitutional: Negative for chills, diaphoresis and fatigue.  HENT: Negative for ear pain, postnasal drip and  sinus pressure.   Eyes: Negative for discharge and itching.  Respiratory: Negative for cough and shortness of breath.   Cardiovascular: Negative for chest pain and palpitations.  Gastrointestinal: Negative for abdominal pain, constipation, diarrhea, nausea and vomiting.  Genitourinary: Negative for dysuria and flank pain.  Musculoskeletal: Negative for arthralgias, back pain, gait problem and neck pain.  Skin: Negative for color change.  Allergic/Immunologic: Negative for environmental allergies and food allergies.  Neurological: Negative for headaches.  Hematological: Does not bruise/bleed easily.  Psychiatric/Behavioral: Negative for agitation, behavioral problems (depression) and hallucinations.   Vital Signs: Resp 16   Ht 5\' 5"  (1.651 m)   Wt 230 lb (104.3 kg)   BMI 38.27 kg/m    Observation/Objective: NAD, pleasant to talk to   Assessment/Plan: 1. Essential (primary) hypertension - Controlled with meds, stable   2. Primary localized osteoarthrosis  - Controlled with meds  - traMADol (ULTRAM) 50 MG tablet; TAKE 1 TABLET BY MOUTH TWICE DAILY AS NEEDED ONLY FOR SEVERE PAIN  Dispense: 60 tablet; Refill: 1  3. Generalized anxiety disorder - Controlled , refilled  - ALPRAZolam (XANAX) 0.5 MG tablet; Take 1/2 t o1 tablet po QD prn  Dispense: 30 tablet; Refill: 1  General Counseling: Sophronia verbalizes understanding of the findings of today's phone visit and agrees with plan of treatment. I have discussed any further diagnostic evaluation that may be needed or ordered today. We also reviewed her medications today. she has been encouraged to call the office with any questions or concerns that should arise related to todays visit.   Time spent:15 Minutes Dr Lavera Guise Internal medicine

## 2019-06-10 ENCOUNTER — Other Ambulatory Visit: Payer: Self-pay

## 2019-06-10 ENCOUNTER — Encounter: Payer: Self-pay | Admitting: Adult Health

## 2019-06-10 ENCOUNTER — Ambulatory Visit: Payer: BC Managed Care – PPO | Admitting: Adult Health

## 2019-06-10 VITALS — BP 150/73 | HR 75 | Temp 97.3°F | Resp 16 | Ht 63.0 in | Wt 226.0 lb

## 2019-06-10 DIAGNOSIS — F411 Generalized anxiety disorder: Secondary | ICD-10-CM

## 2019-06-10 DIAGNOSIS — R5383 Other fatigue: Secondary | ICD-10-CM | POA: Diagnosis not present

## 2019-06-10 DIAGNOSIS — R748 Abnormal levels of other serum enzymes: Secondary | ICD-10-CM

## 2019-06-10 DIAGNOSIS — K582 Mixed irritable bowel syndrome: Secondary | ICD-10-CM | POA: Diagnosis not present

## 2019-06-10 DIAGNOSIS — D510 Vitamin B12 deficiency anemia due to intrinsic factor deficiency: Secondary | ICD-10-CM | POA: Diagnosis not present

## 2019-06-10 NOTE — Progress Notes (Signed)
[facility], [place] [address]  Internal MEDICINE  Office Visit Note  Patient Name: Kristen Sparks  [id number]  [id number]  Date of Service: 06/10/2019  Chief Complaint  Patient presents with  . Gastroesophageal Reflux    been acting up alot lately , been throwing up stomach acids   . Constipation    has been constipated then turns loose , last 6 months stools have been pale almost white , has some dark at times  . Elevated Hepatic Enzymes    turned down for life insurance do not know if elevated or low , was told it had to with liver      HPI Pt is here for a sick visit. Epigastric pain, nausea (intermittent) Bowel Movement- Pale, clay colored.  Diarrhea and constipation.  Also applied for life insurance and was denied for liver issues, but she is unsure of exact numbers.  Family hx, sister bowel cancer and mom had uterine cancer.  Last colonoscopy October 2019  Current Medication:  Outpatient Encounter Medications as of 06/10/2019  Medication Sig  . ALPRAZolam (XANAX) 0.5 MG tablet Take 1/2 t o1 tablet po QD prn  . omeprazole (PRILOSEC) 40 MG capsule Take 1 capsule (40 mg total) by mouth daily.  . sertraline (ZOLOFT) 100 MG tablet TAKE 1 TABLET(100 MG) BY MOUTH DAILY  . traMADol (ULTRAM) 50 MG tablet TAKE 1 TABLET BY MOUTH TWICE DAILY AS NEEDED ONLY FOR SEVERE PAIN   No facility-administered encounter medications on file as of 06/10/2019.      Medical History: Past Medical History:  Diagnosis Date  . Anxiety   . Asthma   . Depression   . Environmental allergies   . Hyperlipidemia   . Hypertension   . Rheumatoid arthritis ([place])   . Sleep apnea 2019   Borderline  . Type 2 diabetes mellitus (HCC)      Vital Signs: BP (!) 150/73   Pulse 75   Temp (!) 97.3 F (36.3 C)   Resp 16   Ht 5\' 3"  (1.6 m)   Wt 226 lb (102.5 kg)   SpO2 99%   BMI 40.03 kg/m    Review of Systems  Constitutional: Positive for fatigue.  Negative for chills and unexpected weight change.  HENT: Negative for congestion, rhinorrhea, sneezing and sore throat.   Eyes: Negative for photophobia, pain and redness.  Respiratory: Negative for cough, chest tightness and shortness of breath.   Cardiovascular: Negative for chest pain and palpitations.  Gastrointestinal: Positive for abdominal pain, constipation, diarrhea, nausea and vomiting. Negative for abdominal distention.  Endocrine: Negative.   Genitourinary: Negative for dysuria and frequency.  Musculoskeletal: Negative for arthralgias, back pain, joint swelling and neck pain.  Skin: Negative for rash.  Allergic/Immunologic: Negative.   Neurological: Negative for tremors and numbness.  Hematological: Negative for adenopathy. Does not bruise/bleed easily.  Psychiatric/Behavioral: Negative for behavioral problems and sleep disturbance. The patient is not nervous/anxious.     Physical Exam Vitals and nursing note reviewed.  Constitutional:      General: She is not in acute distress.    Appearance: She is well-developed. She is not diaphoretic.  HENT:     Head: Normocephalic and atraumatic.     Mouth/Throat:     Pharynx: No oropharyngeal exudate.  Eyes:     Pupils: Pupils are equal, round, and reactive to light.  Neck:     Thyroid: No thyromegaly.     Vascular: No JVD.  Trachea: No tracheal deviation.  Cardiovascular:     Rate and Rhythm: Normal rate and regular rhythm.     Heart sounds: Normal heart sounds. No murmur. No friction rub. No gallop.   Pulmonary:     Effort: Pulmonary effort is normal. No respiratory distress.     Breath sounds: Normal breath sounds. No wheezing or rales.  Chest:     Chest wall: No tenderness.  Abdominal:     Palpations: Abdomen is soft.     Tenderness: There is no abdominal tenderness. There is no guarding.  Musculoskeletal:        General: Normal range of motion.     Cervical back: Normal range of motion and neck supple.   Lymphadenopathy:     Cervical: No cervical adenopathy.  Skin:    General: Skin is warm and dry.  Neurological:     Mental Status: She is alert and oriented to person, place, and time.     Cranial Nerves: No cranial nerve deficit.  Psychiatric:        Behavior: Behavior normal.        Thought Content: Thought content normal.        Judgment: Judgment normal.     Assessment/Plan: 1. Irritable bowel syndrome with both constipation and diarrhea Pt requesting referral to help manage IBS. - Ambulatory referral to Gastroenterology  2. Elevated liver enzymes - Hepatic function panel  3. Other fatigue - B12 and Folate Panel - Vitamin D 1,25 dihydroxy - Fe+TIBC+Fer - CBC w/Diff/Platelet  4. Generalized anxiety disorder Pt does have a significant component of anxiety contributing to her issues.  She is very worried about her health, and given her family history this is understandable.   General Counseling: Haifa verbalizes understanding of the findings of todays visit and agrees with plan of treatment. I have discussed any further diagnostic evaluation that may be needed or ordered today. We also reviewed her medications today. she has been encouraged to call the office with any questions or concerns that should arise related to todays visit.   Orders Placed This Encounter  Procedures  . Hepatic function panel  . Ambulatory referral to Gastroenterology    No orders of the defined types were placed in this encounter.   Time spent: 25 Minutes  This patient was seen by Orson Gear AGNP-C in Collaboration with Dr Lavera Guise as a part of collaborative care agreement.  Kendell Bane AGNP-C Internal Medicine

## 2019-06-16 ENCOUNTER — Telehealth: Payer: Self-pay

## 2019-06-16 NOTE — Telephone Encounter (Signed)
Confirmed appointment with patient. klh °

## 2019-06-17 LAB — HEPATIC FUNCTION PANEL
ALT: 22 IU/L (ref 0–32)
AST: 16 IU/L (ref 0–40)
Albumin: 4.5 g/dL (ref 3.8–4.9)
Alkaline Phosphatase: 165 IU/L — ABNORMAL HIGH (ref 39–117)
Bilirubin Total: 0.2 mg/dL (ref 0.0–1.2)
Bilirubin, Direct: 0.07 mg/dL (ref 0.00–0.40)
Total Protein: 7 g/dL (ref 6.0–8.5)

## 2019-06-17 LAB — CBC WITH DIFFERENTIAL/PLATELET
Basophils Absolute: 0 10*3/uL (ref 0.0–0.2)
Basos: 1 %
EOS (ABSOLUTE): 0.4 10*3/uL (ref 0.0–0.4)
Eos: 5 %
Hematocrit: 38.4 % (ref 34.0–46.6)
Hemoglobin: 13 g/dL (ref 11.1–15.9)
Immature Grans (Abs): 0 10*3/uL (ref 0.0–0.1)
Immature Granulocytes: 0 %
Lymphocytes Absolute: 3.4 10*3/uL — ABNORMAL HIGH (ref 0.7–3.1)
Lymphs: 41 %
MCH: 30 pg (ref 26.6–33.0)
MCHC: 33.9 g/dL (ref 31.5–35.7)
MCV: 89 fL (ref 79–97)
Monocytes Absolute: 0.4 10*3/uL (ref 0.1–0.9)
Monocytes: 4 %
Neutrophils Absolute: 4.2 10*3/uL (ref 1.4–7.0)
Neutrophils: 49 %
Platelets: 244 10*3/uL (ref 150–450)
RBC: 4.34 x10E6/uL (ref 3.77–5.28)
RDW: 13.1 % (ref 11.7–15.4)
WBC: 8.4 10*3/uL (ref 3.4–10.8)

## 2019-06-17 LAB — IRON,TIBC AND FERRITIN PANEL
Ferritin: 214 ng/mL — ABNORMAL HIGH (ref 15–150)
Iron Saturation: 24 % (ref 15–55)
Iron: 70 ug/dL (ref 27–159)
Total Iron Binding Capacity: 291 ug/dL (ref 250–450)
UIBC: 221 ug/dL (ref 131–425)

## 2019-06-17 LAB — B12 AND FOLATE PANEL
Folate: 5.4 ng/mL (ref >3.0)
Vitamin B-12: 301 pg/mL (ref 232–1245)

## 2019-06-17 LAB — VITAMIN D 1,25 DIHYDROXY
Vitamin D 1, 25 (OH)2 Total: 112 pg/mL — ABNORMAL HIGH
Vitamin D2 1, 25 (OH)2: <10 pg/mL
Vitamin D3 1, 25 (OH)2: 112 pg/mL

## 2019-06-18 ENCOUNTER — Other Ambulatory Visit: Payer: Self-pay

## 2019-06-18 ENCOUNTER — Ambulatory Visit: Payer: BC Managed Care – PPO | Admitting: Adult Health

## 2019-06-18 ENCOUNTER — Encounter: Payer: Self-pay | Admitting: Adult Health

## 2019-06-18 VITALS — Ht 65.0 in

## 2019-06-18 DIAGNOSIS — F411 Generalized anxiety disorder: Secondary | ICD-10-CM

## 2019-06-18 DIAGNOSIS — R748 Abnormal levels of other serum enzymes: Secondary | ICD-10-CM | POA: Diagnosis not present

## 2019-06-18 DIAGNOSIS — K2100 Gastro-esophageal reflux disease with esophagitis, without bleeding: Secondary | ICD-10-CM

## 2019-06-18 MED ORDER — OMEPRAZOLE 40 MG PO CPDR: 40.0000 mg | DELAYED_RELEASE_CAPSULE | Freq: Two times a day (BID) | ORAL | 1 refills | Status: DC

## 2019-06-18 NOTE — Progress Notes (Signed)
Most of the labs look normal, please let her keep her GI appointment. She needs to stop all her OTC supplement, increase PPI

## 2019-06-18 NOTE — Progress Notes (Signed)
[facility] [place], [place] [address]  Internal MEDICINE  Telephone Visit  Patient Name: Kristen Sparks  [id number]  [id number]  Date of Service: 06/18/2019  I connected with the patient at 34 by telephone and verified the patients identity using two identifiers.   I discussed the limitations, risks, security and privacy concerns of performing an evaluation and management service by telephone and the availability of in person appointments. I also discussed with the patient that there may be a patient responsible charge related to the service.  The patient expressed understanding and agrees to proceed.    Chief Complaint  Patient presents with  . Telephone Assessment  . Telephone Screen  . Follow-up    review labs and other tests    HPI  Pt is seen for follow up at this time via telephone.  She reports continued flares of gerd symptoms.  She continues to have burning in her esophagus. She has not had any episodes of vomiting since our last visit.  Her labs showed elevated Vit D level, elevated ferritin, and elevated Alkaline phosphatase.    Bowel ribbon like, clay colored  Current Medication: Outpatient Encounter Medications as of 06/18/2019  Medication Sig  . ALPRAZolam (XANAX) 0.5 MG tablet Take 1/2 t o1 tablet po QD prn  . omeprazole (PRILOSEC) 40 MG capsule Take 1 capsule (40 mg total) by mouth daily.  . sertraline (ZOLOFT) 100 MG tablet TAKE 1 TABLET(100 MG) BY MOUTH DAILY  . traMADol (ULTRAM) 50 MG tablet TAKE 1 TABLET BY MOUTH TWICE DAILY AS NEEDED ONLY FOR SEVERE PAIN   No facility-administered encounter medications on file as of 06/18/2019.    Surgical History: Past Surgical History:  Procedure Laterality Date  . CHOLECYSTECTOMY  2000  . colon polyectomy    . COLONOSCOPY WITH PROPOFOL N/A 03/31/2018   Procedure: COLONOSCOPY WITH PROPOFOL;  Surgeon: Jonathon Bellows, MD;  Location: [facility] ENDOSCOPY;  Service: Gastroenterology;  Laterality: N/A;   . FOOT SURGERY      Medical History: Past Medical History:  Diagnosis Date  . Anxiety   . Asthma   . Depression   . Environmental allergies   . Hyperlipidemia   . Hypertension   . Rheumatoid arthritis ([place])   . Sleep apnea 2019   Borderline  . Type 2 diabetes mellitus (HCC)     Family History: Family History  Problem Relation Age of Onset  . Diabetes Mother   . COPD Mother   . Uterine cancer Mother 34  . Cancer Sister 10       Bile Duct    Social History   Socioeconomic History  . Marital status: Married    Spouse name: Not on file  . Number of children: Not on file  . Years of education: Not on file  . Highest education level: Not on file  Occupational History  . Not on file  Tobacco Use  . Smoking status: Never Smoker  . Smokeless tobacco: Never Used  Substance and Sexual Activity  . Alcohol use: Yes  . Drug use: No  . Sexual activity: Yes    Birth control/protection: Post-menopausal  Other Topics Concern  . Not on file  Social History Narrative  . Not on file   Social Determinants of Health   Financial Resource Strain:   . Difficulty of Paying Living Expenses: Not on file  Food Insecurity:   . Worried About Charity fundraiser in the Last Year: Not on file  .  Ran Out of Food in the Last Year: Not on file  Transportation Needs:   . Lack of Transportation (Medical): Not on file  . Lack of Transportation (Non-Medical): Not on file  Physical Activity:   . Days of Exercise per Week: Not on file  . Minutes of Exercise per Session: Not on file  Stress:   . Feeling of Stress : Not on file  Social Connections:   . Frequency of Communication with Friends and Family: Not on file  . Frequency of Social Gatherings with Friends and Family: Not on file  . Attends Religious Services: Not on file  . Active Member of Clubs or Organizations: Not on file  . Attends Archivist Meetings: Not on file  . Marital Status: Not on file  Intimate Partner  Violence:   . Fear of Current or Ex-Partner: Not on file  . Emotionally Abused: Not on file  . Physically Abused: Not on file  . Sexually Abused: Not on file      Review of Systems  Constitutional: Negative for chills, fatigue and unexpected weight change.  HENT: Negative for congestion, rhinorrhea, sneezing and sore throat.   Eyes: Negative for photophobia, pain and redness.  Respiratory: Negative for cough, chest tightness and shortness of breath.   Cardiovascular: Negative for chest pain and palpitations.  Gastrointestinal: Negative for abdominal pain, constipation, diarrhea, nausea and vomiting.  Endocrine: Negative.   Genitourinary: Negative for dysuria and frequency.  Musculoskeletal: Negative for arthralgias, back pain, joint swelling and neck pain.  Skin: Negative for rash.  Allergic/Immunologic: Negative.   Neurological: Negative for tremors and numbness.  Hematological: Negative for adenopathy. Does not bruise/bleed easily.  Psychiatric/Behavioral: Negative for behavioral problems and sleep disturbance. The patient is not nervous/anxious.     Vital Signs: Ht 5\' 5"  (1.651 m)   BMI 37.61 kg/m    Observation/Objective:  Well sounding, NAD noted.   Assessment/Plan: 1. Gastroesophageal reflux disease with esophagitis without hemorrhage Increase PPI to Bid.  Continue to monitor.  See GI - omeprazole (PRILOSEC) 40 MG capsule; Take 1 capsule (40 mg total) by mouth 2 (two) times daily.  Dispense: 60 capsule; Refill: 1  2. Elevated alkaline phosphatase level See GI for evaluation. Abnormal labs. - Ambulatory referral to Gastroenterology  3. Generalized anxiety disorder Controlled, continue present management.   General Counseling: Graisyn verbalizes understanding of the findings of today's phone visit and agrees with plan of treatment. I have discussed any further diagnostic evaluation that may be needed or ordered today. We also reviewed her medications today. she has  been encouraged to call the office with any questions or concerns that should arise related to todays visit.    No orders of the defined types were placed in this encounter.   No orders of the defined types were placed in this encounter.   Time spent:15 Minutes    Orson Gear AGNP-C Internal medicine

## 2019-07-23 ENCOUNTER — Telehealth: Payer: Self-pay

## 2019-07-23 NOTE — Telephone Encounter (Signed)
Called lmom/unavailable informing patient of virtual visit. klh

## 2019-07-27 ENCOUNTER — Ambulatory Visit (INDEPENDENT_AMBULATORY_CARE_PROVIDER_SITE_OTHER): Payer: BC Managed Care – PPO | Admitting: Adult Health

## 2019-07-27 ENCOUNTER — Encounter: Payer: Self-pay | Admitting: Adult Health

## 2019-07-27 VITALS — Ht 63.0 in | Wt 230.0 lb

## 2019-07-27 DIAGNOSIS — Z0001 Encounter for general adult medical examination with abnormal findings: Secondary | ICD-10-CM

## 2019-07-27 DIAGNOSIS — I10 Essential (primary) hypertension: Secondary | ICD-10-CM | POA: Diagnosis not present

## 2019-07-27 DIAGNOSIS — K2100 Gastro-esophageal reflux disease with esophagitis, without bleeding: Secondary | ICD-10-CM | POA: Diagnosis not present

## 2019-07-27 DIAGNOSIS — M1991 Primary osteoarthritis, unspecified site: Secondary | ICD-10-CM

## 2019-07-27 DIAGNOSIS — F411 Generalized anxiety disorder: Secondary | ICD-10-CM | POA: Diagnosis not present

## 2019-07-27 MED ORDER — TRAMADOL HCL 50 MG PO TABS: ORAL_TABLET | ORAL | 1 refills | Status: DC

## 2019-07-27 NOTE — Progress Notes (Signed)
[facility] [place], [place] [address]  Internal MEDICINE  Telephone Visit  Patient Name: Kristen Sparks  [id number]  [id number]  Date of Service: 07/27/2019  I connected with the patient at 1025 by telephone and verified the patients identity using two identifiers.   I discussed the limitations, risks, security and privacy concerns of performing an evaluation and management service by telephone and the availability of in person appointments. I also discussed with the patient that there may be a patient responsible charge related to the service.  The patient expressed understanding and agrees to proceed.    Chief Complaint  Patient presents with  . Telephone Assessment  . Telephone Screen  . Hypertension  . Hyperlipidemia  . Medication Refill    TRAMADOL    HPI  Pt is seen via video for follow up on GERD, HTN, HLD and osteoarthritis.  Overall the patients denies any complaints.  She reports she is at her baseline.  Her GERD is much improved with the increase in her PPI.  She Denies Chest pain, Shortness of breath, palpitations, headache, or blurred vision.      Current Medication: Outpatient Encounter Medications as of 07/27/2019  Medication Sig  . ALPRAZolam (XANAX) 0.5 MG tablet Take 1/2 t o1 tablet po QD prn  . omeprazole (PRILOSEC) 40 MG capsule Take 1 capsule (40 mg total) by mouth 2 (two) times daily.  . sertraline (ZOLOFT) 100 MG tablet TAKE 1 TABLET(100 MG) BY MOUTH DAILY  . traMADol (ULTRAM) 50 MG tablet TAKE 1 TABLET BY MOUTH TWICE DAILY AS NEEDED ONLY FOR SEVERE PAIN  . [DISCONTINUED] traMADol (ULTRAM) 50 MG tablet TAKE 1 TABLET BY MOUTH TWICE DAILY AS NEEDED ONLY FOR SEVERE PAIN   No facility-administered encounter medications on file as of 07/27/2019.    Surgical History: Past Surgical History:  Procedure Laterality Date  . CHOLECYSTECTOMY  2000  . colon polyectomy    . COLONOSCOPY WITH PROPOFOL N/A 03/31/2018   Procedure: COLONOSCOPY WITH  PROPOFOL;  Surgeon: Jonathon Bellows, MD;  Location: [facility] ENDOSCOPY;  Service: Gastroenterology;  Laterality: N/A;  . FOOT SURGERY      Medical History: Past Medical History:  Diagnosis Date  . Anxiety   . Asthma   . Depression   . Environmental allergies   . Hyperlipidemia   . Hypertension   . Rheumatoid arthritis ([place])   . Sleep apnea 2019   Borderline  . Type 2 diabetes mellitus (HCC)     Family History: Family History  Problem Relation Age of Onset  . Diabetes Mother   . COPD Mother   . Uterine cancer Mother 64  . Cancer Sister 46       Bile Duct    Social History   Socioeconomic History  . Marital status: Married    Spouse name: Not on file  . Number of children: Not on file  . Years of education: Not on file  . Highest education level: Not on file  Occupational History  . Not on file  Tobacco Use  . Smoking status: Never Smoker  . Smokeless tobacco: Never Used  Substance and Sexual Activity  . Alcohol use: Yes  . Drug use: No  . Sexual activity: Yes    Birth control/protection: Post-menopausal  Other Topics Concern  . Not on file  Social History Narrative  . Not on file   Social Determinants of Health   Financial Resource Strain:   . Difficulty of Paying Living Expenses: Not  on file  Food Insecurity:   . Worried About Charity fundraiser in the Last Year: Not on file  . Ran Out of Food in the Last Year: Not on file  Transportation Needs:   . Lack of Transportation (Medical): Not on file  . Lack of Transportation (Non-Medical): Not on file  Physical Activity:   . Days of Exercise per Week: Not on file  . Minutes of Exercise per Session: Not on file  Stress:   . Feeling of Stress : Not on file  Social Connections:   . Frequency of Communication with Friends and Family: Not on file  . Frequency of Social Gatherings with Friends and Family: Not on file  . Attends Religious Services: Not on file  . Active Member of Clubs or Organizations: Not on file   . Attends Archivist Meetings: Not on file  . Marital Status: Not on file  Intimate Partner Violence:   . Fear of Current or Ex-Partner: Not on file  . Emotionally Abused: Not on file  . Physically Abused: Not on file  . Sexually Abused: Not on file      Review of Systems  Constitutional: Negative for chills, fatigue and unexpected weight change.  HENT: Negative for congestion, rhinorrhea, sneezing and sore throat.   Eyes: Negative for photophobia, pain and redness.  Respiratory: Negative for cough, chest tightness and shortness of breath.   Cardiovascular: Negative for chest pain and palpitations.  Gastrointestinal: Negative for abdominal pain, constipation, diarrhea, nausea and vomiting.  Endocrine: Negative.   Genitourinary: Negative for dysuria and frequency.  Musculoskeletal: Negative for arthralgias, back pain, joint swelling and neck pain.  Skin: Negative for rash.  Allergic/Immunologic: Negative.   Neurological: Negative for tremors and numbness.  Hematological: Negative for adenopathy. Does not bruise/bleed easily.  Psychiatric/Behavioral: Negative for behavioral problems and sleep disturbance. The patient is not nervous/anxious.     Vital Signs: Ht 5\' 3"  (1.6 m)   Wt 230 lb (104.3 kg)   BMI 40.74 kg/m    Observation/Objective:  Well appearing, NAD noted.    Assessment/Plan: 1. Primary localized osteoarthrosis Reviewed risks and possible side effects associated with taking opiates, benzodiazepines and other CNS depressants. Combination of these could cause dizziness and drowsiness. Advised patient not to drive or operate machinery when taking these medications, as patient's and other's life can be at risk and will have consequences. Patient verbalized understanding in this matter. Dependence and abuse for these drugs will be monitored closely. A Controlled substance policy and procedure is on file which allows [facility] to order a urine  drug screen test at any visit. Patient understands and agrees with the plan - traMADol (ULTRAM) 50 MG tablet; TAKE 1 TABLET BY MOUTH TWICE DAILY AS NEEDED ONLY FOR SEVERE PAIN  Dispense: 60 tablet; Refill: 1  2. Gastroesophageal reflux disease with esophagitis without hemorrhage Much improved, continue PPI.  3. Generalized anxiety disorder Stable, continue current medications.  4. Essential (primary) hypertension Controlled, continue current meds.  5. Encounter for general adult medical examination with abnormal findings Blood to be drawn before physical (next visit) - CBC with Differential/Platelet - Lipid Panel With LDL/HDL Ratio - TSH - T4, free - Comprehensive metabolic panel - [id number] and Folate Panel - Vitamin D (25 hydroxy) - Fe+TIBC+Fer  General Counseling: Zyana verbalizes understanding of the findings of today's phone visit and agrees with plan of treatment. I have discussed any further diagnostic evaluation that may be needed or ordered today. We  also reviewed her medications today. she has been encouraged to call the office with any questions or concerns that should arise related to todays visit.    Orders Placed This Encounter  Procedures  . CBC with Differential/Platelet  . Lipid Panel With LDL/HDL Ratio  . TSH  . T4, free  . Comprehensive metabolic panel  . B12 and Folate Panel  . Vitamin D (25 hydroxy)  . Fe+TIBC+Fer    Meds ordered this encounter  Medications  . traMADol (ULTRAM) 50 MG tablet    Sig: TAKE 1 TABLET BY MOUTH TWICE DAILY AS NEEDED ONLY FOR SEVERE PAIN    Dispense:  60 tablet    Refill:  1    Time spent: Raymond AGNP-C Internal medicine

## 2019-07-29 ENCOUNTER — Other Ambulatory Visit: Payer: Self-pay

## 2019-07-29 DIAGNOSIS — F411 Generalized anxiety disorder: Secondary | ICD-10-CM

## 2019-07-29 MED ORDER — SERTRALINE HCL 100 MG PO TABS: 100.0000 mg | ORAL_TABLET | Freq: Every day | ORAL | 0 refills | Status: DC

## 2019-08-05 ENCOUNTER — Ambulatory Visit: Payer: BC Managed Care – PPO | Admitting: Gastroenterology

## 2019-08-10 ENCOUNTER — Ambulatory Visit
Admission: RE | Admit: 2019-08-10 | Discharge: 2019-08-10 | Disposition: A | Discharge status: Home / Self Care | Payer: BC Managed Care – PPO | Source: Ambulatory Visit | Attending: Obstetrics and Gynecology | Admitting: Obstetrics and Gynecology

## 2019-08-10 DIAGNOSIS — Z1231 Encounter for screening mammogram for malignant neoplasm of breast: Secondary | ICD-10-CM | POA: Diagnosis not present

## 2019-08-10 DIAGNOSIS — Z1239 Encounter for other screening for malignant neoplasm of breast: Secondary | ICD-10-CM

## 2019-09-22 ENCOUNTER — Ambulatory Visit: Payer: BC Managed Care – PPO | Admitting: Nurse Practitioner

## 2019-09-22 ENCOUNTER — Encounter: Payer: Self-pay | Admitting: Nurse Practitioner

## 2019-09-22 VITALS — Temp 98.7°F | Ht 63.0 in | Wt 230.0 lb

## 2019-09-22 DIAGNOSIS — J0111 Acute recurrent frontal sinusitis: Secondary | ICD-10-CM | POA: Diagnosis not present

## 2019-09-22 MED ORDER — SULFAMETHOXAZOLE-TRIMETHOPRIM 800-160 MG PO TABS: 1.0000 | ORAL_TABLET | Freq: Two times a day (BID) | ORAL | 0 refills | Status: DC

## 2019-09-22 NOTE — Progress Notes (Signed)
[facility] [place], [place] [address]  Internal MEDICINE  Telephone Visit  Patient Name: Kristen Sparks  [id number]  [id number]  Date of Service: 10/07/2019  I connected with the patient at 4:54pm by telephone and verified the patients identity using two identifiers.   I discussed the limitations, risks, security and privacy concerns of performing an evaluation and management service by telephone and the availability of in person appointments. I also discussed with the patient that there may be a patient responsible charge related to the service.  The patient expressed understanding and agrees to proceed.    Chief Complaint  Patient presents with  . Telephone Assessment  . Telephone Screen  . Sore Throat  . Sinusitis  . Ear Pain    left  . Headache    The patient has been contacted via telephone for follow up visit due to concerns for spread of novel coronavirus.  She presents for acute visit. She has sinus headache. She has had a lot of post nasal drip. Her left ear is painful. She denies fever or chills. She denies nausea or vomiting. She does not believe she has been exposed to anyone who has COVID 19. She states that symptoms are moderate to severe and getting worse.       Current Medication: Outpatient Encounter Medications as of 09/22/2019  Medication Sig  . ALPRAZolam (XANAX) 0.5 MG tablet Take 1/2 t o1 tablet po QD prn  . omeprazole (PRILOSEC) 40 MG capsule Take 1 capsule (40 mg total) by mouth 2 (two) times daily.  . traMADol (ULTRAM) 50 MG tablet TAKE 1 TABLET BY MOUTH TWICE DAILY AS NEEDED ONLY FOR SEVERE PAIN  . [DISCONTINUED] sertraline (ZOLOFT) 100 MG tablet Take 1 tablet (100 mg total) by mouth daily.  Marland Kitchen sulfamethoxazole-trimethoprim (BACTRIM DS) 800-160 MG tablet Take 1 tablet by mouth 2 (two) times daily.   No facility-administered encounter medications on file as of 09/22/2019.    Surgical History: Past Surgical History:  Procedure  Laterality Date  . CHOLECYSTECTOMY  2000  . colon polyectomy    . COLONOSCOPY WITH PROPOFOL N/A 03/31/2018   Procedure: COLONOSCOPY WITH PROPOFOL;  Surgeon: Jonathon Bellows, MD;  Location: [facility] ENDOSCOPY;  Service: Gastroenterology;  Laterality: N/A;  . FOOT SURGERY      Medical History: Past Medical History:  Diagnosis Date  . Anxiety   . Asthma   . Depression   . Environmental allergies   . Hyperlipidemia   . Hypertension   . Rheumatoid arthritis ([place])   . Sleep apnea 2019   Borderline  . Type 2 diabetes mellitus (HCC)     Family History: Family History  Problem Relation Age of Onset  . Diabetes Mother   . COPD Mother   . Uterine cancer Mother 30  . Cancer Sister 78       Bile Duct  . Breast cancer Neg Hx     Social History   Socioeconomic History  . Marital status: Married    Spouse name: Not on file  . Number of children: Not on file  . Years of education: Not on file  . Highest education level: Not on file  Occupational History  . Not on file  Tobacco Use  . Smoking status: Never Smoker  . Smokeless tobacco: Never Used  Substance and Sexual Activity  . Alcohol use: Yes  . Drug use: No  . Sexual activity: Yes    Birth control/protection: Post-menopausal  Other Topics Concern  .  Not on file  Social History Narrative  . Not on file   Social Determinants of Health   Financial Resource Strain:   . Difficulty of Paying Living Expenses:   Food Insecurity:   . Worried About Charity fundraiser in the Last Year:   . Arboriculturist in the Last Year:   Transportation Needs:   . Film/video editor (Medical):   Marland Kitchen Lack of Transportation (Non-Medical):   Physical Activity:   . Days of Exercise per Week:   . Minutes of Exercise per Session:   Stress:   . Feeling of Stress :   Social Connections:   . Frequency of Communication with Friends and Family:   . Frequency of Social Gatherings with Friends and Family:   . Attends Religious Services:   . Active  Member of Clubs or Organizations:   . Attends Archivist Meetings:   Marland Kitchen Marital Status:   Intimate Partner Violence:   . Fear of Current or Ex-Partner:   . Emotionally Abused:   Marland Kitchen Physically Abused:   . Sexually Abused:       Review of Systems  Constitutional: Positive for chills. Negative for fatigue.  HENT: Positive for congestion, ear pain, postnasal drip, rhinorrhea, sinus pain and sore throat. Negative for voice change.   Respiratory: Positive for cough and wheezing.   Cardiovascular: Negative for chest pain and palpitations.  Gastrointestinal: Positive for nausea. Negative for vomiting.  Musculoskeletal: Positive for arthralgias.  Allergic/Immunologic: Positive for environmental allergies.  Neurological: Positive for headaches.    Today's Vitals   09/22/19 1508  Temp: 98.7 F (37.1 C)  Weight: 230 lb (104.3 kg)  Height: 5\' 3"  (1.6 m)   Body mass index is 40.74 kg/m.  Observation/Objective:   The patient is alert and oriented. She is pleasant and answers all questions appropriately. Breathing is non-labored. She is in no acute distress at this time. The patient is nasally congested and appears to feel poorly.    Assessment/Plan:  1. Acute recurrent frontal sinusitis Start bactrim ds bid for 7 days. Rest and increase fluids. Use OTC medications to alleviate acute symptoms.  - sulfamethoxazole-trimethoprim (BACTRIM DS) 800-160 MG tablet; Take 1 tablet by mouth 2 (two) times daily.  Dispense: 14 tablet; Refill: 0  General Counseling: Kristen Sparks verbalizes understanding of the findings of today's phone visit and agrees with plan of treatment. I have discussed any further diagnostic evaluation that may be needed or ordered today. We also reviewed her medications today. she has been encouraged to call the office with any questions or concerns that should arise related to todays visit.   This patient was seen by [place] with Dr Lavera Guise as  a part of collaborative care agreement  Meds ordered this encounter  Medications  . sulfamethoxazole-trimethoprim (BACTRIM DS) 800-160 MG tablet    Sig: Take 1 tablet by mouth 2 (two) times daily.    Dispense:  14 tablet    Refill:  0    Order Specific Question:   Supervising Provider    Answer:   Lavera Guise [id number]    Time spent: 45 Minutes    Dr Lavera Guise Internal medicine

## 2019-09-23 ENCOUNTER — Other Ambulatory Visit: Payer: Self-pay | Admitting: Adult Health

## 2019-09-23 DIAGNOSIS — F411 Generalized anxiety disorder: Secondary | ICD-10-CM

## 2019-09-28 ENCOUNTER — Other Ambulatory Visit: Payer: Self-pay

## 2019-09-28 MED ORDER — BENAZEPRIL HCL 10 MG PO TABS: 10.0000 mg | ORAL_TABLET | Freq: Every day | ORAL | 0 refills | Status: DC

## 2019-10-07 DIAGNOSIS — J0111 Acute recurrent frontal sinusitis: Secondary | ICD-10-CM | POA: Insufficient documentation

## 2019-10-27 ENCOUNTER — Other Ambulatory Visit: Payer: Self-pay

## 2019-10-27 ENCOUNTER — Other Ambulatory Visit: Payer: Self-pay | Admitting: Internal Medicine

## 2019-10-27 DIAGNOSIS — M1991 Primary osteoarthritis, unspecified site: Secondary | ICD-10-CM

## 2019-10-28 MED ORDER — TRAMADOL HCL 50 MG PO TABS: ORAL_TABLET | ORAL | 1 refills | Status: DC

## 2019-11-09 ENCOUNTER — Other Ambulatory Visit: Payer: Self-pay | Admitting: Adult Health

## 2019-11-09 DIAGNOSIS — K2100 Gastro-esophageal reflux disease with esophagitis, without bleeding: Secondary | ICD-10-CM

## 2019-11-09 MED ORDER — BENAZEPRIL HCL 10 MG PO TABS: 10.0000 mg | ORAL_TABLET | Freq: Every day | ORAL | 0 refills | Status: DC

## 2019-11-24 ENCOUNTER — Other Ambulatory Visit: Payer: Self-pay

## 2019-11-24 ENCOUNTER — Encounter: Payer: Self-pay | Admitting: Nurse Practitioner

## 2019-11-24 ENCOUNTER — Ambulatory Visit (INDEPENDENT_AMBULATORY_CARE_PROVIDER_SITE_OTHER): Payer: BC Managed Care – PPO | Admitting: Nurse Practitioner

## 2019-11-24 VITALS — BP 151/85 | HR 72 | Temp 97.4°F | Resp 16 | Ht 63.0 in | Wt 228.0 lb

## 2019-11-24 DIAGNOSIS — K2100 Gastro-esophageal reflux disease with esophagitis, without bleeding: Secondary | ICD-10-CM | POA: Insufficient documentation

## 2019-11-24 DIAGNOSIS — M1991 Primary osteoarthritis, unspecified site: Secondary | ICD-10-CM | POA: Diagnosis not present

## 2019-11-24 DIAGNOSIS — K5900 Constipation, unspecified: Secondary | ICD-10-CM | POA: Insufficient documentation

## 2019-11-24 DIAGNOSIS — F411 Generalized anxiety disorder: Secondary | ICD-10-CM

## 2019-11-24 MED ORDER — ALPRAZOLAM 0.5 MG PO TABS: ORAL_TABLET | ORAL | 1 refills | Status: DC

## 2019-11-24 MED ORDER — OMEPRAZOLE 40 MG PO CPDR: 40.0000 mg | DELAYED_RELEASE_CAPSULE | Freq: Two times a day (BID) | ORAL | 3 refills | Status: DC

## 2019-11-24 MED ORDER — TRAMADOL HCL 50 MG PO TABS: ORAL_TABLET | ORAL | 1 refills | Status: DC

## 2019-11-24 NOTE — Progress Notes (Signed)
[facility] [place], [place] [address]  Internal MEDICINE  Office Visit Note  Patient Name: Kristen Sparks  [id number]  [id number]  Date of Service: 11/24/2019  Chief Complaint  Patient presents with  . Diarrhea    Change in bowel movements, soft stool, green; constipated now, loose yesterday     The patient presents for sick visit. She states that Saturday morning, she woke up having a softer bowel movement. She describes it as appearing as a cocoon. Outside was a medium green color with clay colored stool inside. On Sunday, stool was normal in consistency but a darker green in color. Today, she states that she is a little constipated. Still having some dark green colored stool. Did eat cabbage on Friday. She also states that she is having some itching of the rectum. States that every time she has a bowel movement she has itching of the rectum. No rash or hemorrhoids are noted. She denies abdominal pain, nausea, vomiting,, or diarrhea. She did state, that at last colonoscopy, was recommended she have colonoscopy every two years and it is about time for that. Reviewed colonoscopy report done 03/2018 showing diverticulosis of the sigmoid colon. She had four small, non cancerous polyps were removed. Surveillance colonoscopy should be done in 3 to 5 years. Done per Dr. Vicente Males.   Pt is here for a sick visit.     Current Medication:  Outpatient Encounter Medications as of 11/24/2019  Medication Sig  . ALPRAZolam (XANAX) 0.5 MG tablet Take 1/2 t o1 tablet po QD prn  . benazepril (LOTENSIN) 10 MG tablet Take 1 tablet (10 mg total) by mouth daily.  Marland Kitchen omeprazole (PRILOSEC) 40 MG capsule Take 1 capsule (40 mg total) by mouth 2 (two) times daily.  . sertraline (ZOLOFT) 100 MG tablet TAKE 1 TABLET(100 MG) BY MOUTH DAILY  . traMADol (ULTRAM) 50 MG tablet TAKE 1 TABLET BY MOUTH TWICE DAILY AS NEEDED ONLY FOR SEVERE PAIN  . [DISCONTINUED] ALPRAZolam (XANAX) 0.5 MG tablet Take  1/2 t o1 tablet po QD prn  . [DISCONTINUED] omeprazole (PRILOSEC) 40 MG capsule TAKE 1 CAPSULE(40 MG) BY MOUTH TWICE DAILY (Patient taking differently: 2 capsules a day.)  . [DISCONTINUED] traMADol (ULTRAM) 50 MG tablet TAKE 1 TABLET BY MOUTH TWICE DAILY AS NEEDED ONLY FOR SEVERE PAIN  . [DISCONTINUED] traMADol (ULTRAM) 50 MG tablet TAKE 1 TABLET BY MOUTH TWICE DAILY AS NEEDED FOR SEVERE PAIN  . sulfamethoxazole-trimethoprim (BACTRIM DS) 800-160 MG tablet Take 1 tablet by mouth 2 (two) times daily.   No facility-administered encounter medications on file as of 11/24/2019.      Medical History: Past Medical History:  Diagnosis Date  . Anxiety   . Asthma   . Depression   . Environmental allergies   . Hyperlipidemia   . Hypertension   . Rheumatoid arthritis ([place])   . Sleep apnea 2019   Borderline  . Type 2 diabetes mellitus ([place])      Today's Vitals   11/24/19 0844  BP: (!) 151/85  Pulse: 72  Resp: 16  Temp: (!) 97.4 F (36.3 C)  SpO2: 97%  Weight: 228 lb (103.4 kg)  Height: 5\' 3"  (1.6 m)   Body mass index is 40.39 kg/m.  Review of Systems  Constitutional: Negative for chills, fatigue and unexpected weight change.  HENT: Negative for congestion, rhinorrhea, sneezing and sore throat.   Respiratory: Negative for cough, chest tightness and shortness of breath.   Cardiovascular: Negative for chest pain  and palpitations.  Gastrointestinal: Positive for constipation. Negative for abdominal pain, diarrhea, nausea and vomiting.       Has had a green color to her stools the past few days. Bowel movements were soft over the weekend and she is now having some constipation.   Endocrine: Negative for cold intolerance, heat intolerance, polydipsia and polyuria.  Musculoskeletal: Positive for arthralgias. Negative for back pain, joint swelling and neck pain.  Skin: Negative for rash.  Neurological: Negative for dizziness, tremors, numbness and headaches.  Hematological: Negative for  adenopathy. Does not bruise/bleed easily.  Psychiatric/Behavioral: Negative for behavioral problems and sleep disturbance. The patient is nervous/anxious.     Physical Exam Vitals and nursing note reviewed.  Constitutional:      General: She is not in acute distress.    Appearance: Normal appearance. She is well-developed. She is not diaphoretic.  HENT:     Head: Normocephalic and atraumatic.     Mouth/Throat:     Pharynx: No oropharyngeal exudate.  Eyes:     Pupils: Pupils are equal, round, and reactive to light.  Neck:     Thyroid: No thyromegaly.     Vascular: No JVD.     Trachea: No tracheal deviation.  Cardiovascular:     Rate and Rhythm: Normal rate and regular rhythm.     Heart sounds: Normal heart sounds. No murmur. No friction rub. No gallop.   Pulmonary:     Effort: Pulmonary effort is normal. No respiratory distress.     Breath sounds: Normal breath sounds. No wheezing or rales.  Chest:     Chest wall: No tenderness.  Abdominal:     General: Bowel sounds are normal.     Palpations: Abdomen is soft.     Tenderness: There is no abdominal tenderness.  Musculoskeletal:        General: Normal range of motion.     Cervical back: Normal range of motion and neck supple.  Lymphadenopathy:     Cervical: No cervical adenopathy.  Skin:    General: Skin is warm and dry.  Neurological:     Mental Status: She is alert and oriented to person, place, and time.     Cranial Nerves: No cranial nerve deficit.  Psychiatric:        Mood and Affect: Mood normal.        Behavior: Behavior normal.        Thought Content: Thought content normal.        Judgment: Judgment normal.   Assessment/Plan:  1. Constipation, unspecified constipation type Patient having some constipation with green colored stool. No abdominal tenderness present. Recommended she limit roughage in her diet and increase fiber. Monitor closely.   2. Gastroesophageal reflux disease with esophagitis without  hemorrhage May continue omeprazole 40mg  twice daily as needed for GERD - omeprazole (PRILOSEC) 40 MG capsule; Take 1 capsule (40 mg total) by mouth 2 (two) times daily.  Dispense: 60 capsule; Refill: 3  3. Primary localized osteoarthrosis May take tramadol 50mg  up to twice daily as needed for pain. A new prescription sent to her pharmacy today.  - traMADol (ULTRAM) 50 MG tablet; TAKE 1 TABLET BY MOUTH TWICE DAILY AS NEEDED ONLY FOR SEVERE PAIN  Dispense: 60 tablet; Refill: 1  4. Generalized anxiety disorder May continue to take alprazolam 0.5mg  daily as needed for acute anxiety. A new prescription was sent to her pharmacy today, as refills from prior prescription had expired  - ALPRAZolam (XANAX) 0.5 MG tablet; Take 1/2  t o1 tablet po QD prn  Dispense: 30 tablet; Refill: 1   General Counseling: Ysidra verbalizes understanding of the findings of todays visit and agrees with plan of treatment. I have discussed any further diagnostic evaluation that may be needed or ordered today. We also reviewed her medications today. she has been encouraged to call the office with any questions or concerns that should arise related to todays visit.    Counseling:  This patient was seen by [place] with Dr Lavera Guise as a part of collaborative care agreement   Meds ordered this encounter  Medications  . omeprazole (PRILOSEC) 40 MG capsule    Sig: Take 1 capsule (40 mg total) by mouth 2 (two) times daily.    Dispense:  60 capsule    Refill:  3    Order Specific Question:   Supervising Provider    Answer:   Lavera Guise [id number]  . traMADol (ULTRAM) 50 MG tablet    Sig: TAKE 1 TABLET BY MOUTH TWICE DAILY AS NEEDED ONLY FOR SEVERE PAIN    Dispense:  60 tablet    Refill:  1    Order Specific Question:   Supervising Provider    Answer:   Lavera Guise [id number]  . ALPRAZolam (XANAX) 0.5 MG tablet    Sig: Take 1/2 t o1 tablet po QD prn    Dispense:  30 tablet    Refill:  1     Order Specific Question:   Supervising Provider    Answer:   Lavera Guise [id number]    Time spent: 30 Minutes

## 2019-12-22 ENCOUNTER — Other Ambulatory Visit: Payer: Self-pay

## 2019-12-22 ENCOUNTER — Ambulatory Visit: Payer: BC Managed Care – PPO | Admitting: Adult Health

## 2019-12-22 ENCOUNTER — Telehealth: Payer: Self-pay

## 2019-12-22 ENCOUNTER — Encounter: Payer: Self-pay | Admitting: Adult Health

## 2019-12-22 VITALS — BP 150/80 | HR 67 | Temp 97.5°F | Resp 16 | Ht 63.0 in | Wt 227.6 lb

## 2019-12-22 DIAGNOSIS — K2100 Gastro-esophageal reflux disease with esophagitis, without bleeding: Secondary | ICD-10-CM

## 2019-12-22 DIAGNOSIS — R5383 Other fatigue: Secondary | ICD-10-CM | POA: Diagnosis not present

## 2019-12-22 DIAGNOSIS — R413 Other amnesia: Secondary | ICD-10-CM | POA: Diagnosis not present

## 2019-12-22 DIAGNOSIS — I499 Cardiac arrhythmia, unspecified: Secondary | ICD-10-CM | POA: Diagnosis not present

## 2019-12-22 DIAGNOSIS — M1991 Primary osteoarthritis, unspecified site: Secondary | ICD-10-CM

## 2019-12-22 DIAGNOSIS — Z79899 Other long term (current) drug therapy: Secondary | ICD-10-CM | POA: Diagnosis not present

## 2019-12-22 DIAGNOSIS — I10 Essential (primary) hypertension: Secondary | ICD-10-CM

## 2019-12-22 MED ORDER — BENAZEPRIL HCL 20 MG PO TABS: 10.0000 mg | ORAL_TABLET | Freq: Every day | ORAL | 1 refills | Status: DC

## 2019-12-22 MED ORDER — TRAMADOL HCL 50 MG PO TABS: ORAL_TABLET | ORAL | 1 refills | Status: DC

## 2019-12-22 NOTE — Progress Notes (Signed)
[facility] [place], [place] [address]  Internal MEDICINE  Office Visit Note  Patient Name: Kristen Sparks  [id number]  [id number]  Date of Service: 12/22/2019  Chief Complaint  Patient presents with   Acute Visit    having high BP 177/85, 177/80 has been the readings, started taking 2 tablets of BP medicine instead of 1 doesnt help   Fatigue     HPI Pt is here for a sick visit. She believes that her blood pressure has been high. Her blood pressure is slightly elevated but not alarmingly high.  She does report she has episodes where she feels weak, and has an "odd sensation" through her body.  She feels like someone has unplugged her, her energy wanes and it can last for a few minutes.  She does not pass out. She had an episode while in the office.  Her pulse rate was irregular. She denies any blurred vision, headache,            Current Medication:  Outpatient Encounter Medications as of 12/22/2019  Medication Sig   ALPRAZolam (XANAX) 0.5 MG tablet Take 1/2 t o1 tablet po QD prn   benazepril (LOTENSIN) 10 MG tablet Take 1 tablet (10 mg total) by mouth daily.   omeprazole (PRILOSEC) 40 MG capsule Take 1 capsule (40 mg total) by mouth 2 (two) times daily.   sertraline (ZOLOFT) 100 MG tablet TAKE 1 TABLET(100 MG) BY MOUTH DAILY   traMADol (ULTRAM) 50 MG tablet TAKE 1 TABLET BY MOUTH TWICE DAILY AS NEEDED ONLY FOR SEVERE PAIN   No facility-administered encounter medications on file as of 12/22/2019.      Medical History: Past Medical History:  Diagnosis Date   Anxiety    Asthma    Depression    Environmental allergies    Hyperlipidemia    Hypertension    Rheumatoid arthritis (HCC)    Sleep apnea 2019   Borderline   Type 2 diabetes mellitus (HCC)      Vital Signs: BP (!) 150/80    Pulse 67    Temp (!) 97.5 F (36.4 C)    Resp 16    Ht 5\' 3"  (1.6 m)    Wt 227 lb 9.6 oz (103.2 kg)    SpO2 97%    BMI 40.32 kg/m    Review of  Systems  Constitutional: Positive for fatigue. Negative for chills and unexpected weight change.  HENT: Negative for congestion, rhinorrhea, sneezing and sore throat.   Eyes: Negative for photophobia, pain and redness.  Respiratory: Negative for cough, chest tightness and shortness of breath.   Cardiovascular: Negative for chest pain and palpitations.  Gastrointestinal: Negative for abdominal pain, constipation, diarrhea, nausea and vomiting.  Endocrine: Negative.   Genitourinary: Negative for dysuria and frequency.  Musculoskeletal: Negative for arthralgias, back pain, joint swelling and neck pain.  Skin: Negative for rash.  Allergic/Immunologic: Negative.   Neurological: Positive for headaches. Negative for tremors and numbness.       Memory changes  Hematological: Negative for adenopathy. Does not bruise/bleed easily.  Psychiatric/Behavioral: Negative for behavioral problems and sleep disturbance. The patient is not nervous/anxious.     Physical Exam Vitals and nursing note reviewed.  Constitutional:      General: She is not in acute distress.    Appearance: She is well-developed. She is not diaphoretic.  HENT:     Head: Normocephalic and atraumatic.     Mouth/Throat:     Pharynx: No oropharyngeal  exudate.  Eyes:     Pupils: Pupils are equal, round, and reactive to light.  Neck:     Thyroid: No thyromegaly.     Vascular: No JVD.     Trachea: No tracheal deviation.  Cardiovascular:     Rate and Rhythm: Normal rate. Rhythm irregular.     Heart sounds: Normal heart sounds. No murmur heard.  No friction rub. No gallop.   Pulmonary:     Effort: Pulmonary effort is normal. No respiratory distress.     Breath sounds: Normal breath sounds. No wheezing or rales.  Chest:     Chest wall: No tenderness.  Abdominal:     Palpations: Abdomen is soft.     Tenderness: There is no abdominal tenderness. There is no guarding.  Musculoskeletal:        General: Normal range of motion.      Cervical back: Normal range of motion and neck supple.  Lymphadenopathy:     Cervical: No cervical adenopathy.  Skin:    General: Skin is warm and dry.  Neurological:     Mental Status: She is alert and oriented to person, place, and time.     Cranial Nerves: No cranial nerve deficit.  Psychiatric:        Behavior: Behavior normal.        Thought Content: Thought content normal.        Judgment: Judgment normal.    Assessment/Plan: 1. Irregular heartbeat Patient will need holter monitor,EKG normal in office.  Discussed labs and will get holter eval for episodes of irregular heartbeat.  - HOLTER MONITOR - 48 HOUR; Future - EKG 12-Lead - CBC with Differential/Platelet - Comprehensive metabolic panel  2. Gastroesophageal reflux disease with esophagitis without hemorrhage Stable continue GERD medication as directed.   3. Memory changes Discussed carotid US. Will schedule.  - US Carotid Bilateral; Future - CBC with Differential/Platelet - Comprehensive metabolic panel  4. Long-term use of high-risk medication - CBC with Differential/Platelet - TSH - T4, free - Comprehensive metabolic panel - Vitamin D (25 hydroxy) - B12 and Folate Panel - Fe+TIBC+Fer  5. Other fatigue Have labs drawn.  - CBC with Differential/Platelet - TSH - T4, free - Comprehensive metabolic panel - Vitamin D (25 hydroxy) - B12 and Folate Panel - Fe+TIBC+Fer  6. Primary localized osteoarthrosis Reviewed risks and possible side effects associated with taking opiates, benzodiazepines and other CNS depressants. Combination of these could cause dizziness and drowsiness. Advised patient not to drive or operate machinery when taking these medications, as patient's and other's life can be at risk and will have consequences. Patient verbalized understanding in this matter. Dependence and abuse for these drugs will be monitored closely. A Controlled substance policy and procedure is on file which allows [place]  medical associates to order a urine drug screen test at any visit. Patient understands and agrees with the plan - traMADol (ULTRAM) 50 MG tablet; TAKE 1 TABLET BY MOUTH TWICE DAILY AS NEEDED ONLY FOR SEVERE PAIN  Dispense: 60 tablet; Refill: 1  7. Essential (primary) hypertension Increase lotensin to 20mg  daily.  - benazepril (LOTENSIN) 20 MG tablet; Take 0.5 tablets (10 mg total) by mouth daily.  Dispense: 90 tablet; Refill: 1  General Counseling: Azariyah verbalizes understanding of the findings of todays visit and agrees with plan of treatment. I have discussed any further diagnostic evaluation that may be needed or ordered today. We also reviewed her medications today. she has been encouraged to call the office with  any questions or concerns that should arise related to todays visit.   Orders Placed This Encounter  Procedures   US Carotid Bilateral   HOLTER MONITOR - 48 HOUR    No orders of the defined types were placed in this encounter.   Time spent: 30 Minutes  This patient was seen by Orson Gear AGNP-C in Collaboration with Dr Lavera Guise as a part of collaborative care agreement.  Kendell Bane AGNP-C Internal Medicine

## 2019-12-22 NOTE — Telephone Encounter (Signed)
Called lmom informing patient of appointment on 12/24/2019. klh

## 2019-12-23 LAB — COMPREHENSIVE METABOLIC PANEL
ALT: 26 IU/L (ref 0–32)
AST: 17 IU/L (ref 0–40)
Albumin/Globulin Ratio: 1.7 (ref 1.2–2.2)
Albumin: 4.7 g/dL (ref 3.8–4.9)
Alkaline Phosphatase: 209 IU/L — ABNORMAL HIGH (ref 48–121)
BUN/Creatinine Ratio: 16 (ref 9–23)
BUN: 12 mg/dL (ref 6–24)
Bilirubin Total: 0.4 mg/dL (ref 0.0–1.2)
CO2: 23 mmol/L (ref 20–29)
Calcium: 10 mg/dL (ref 8.7–10.2)
Chloride: 101 mmol/L (ref 96–106)
Creatinine, Ser: 0.75 mg/dL (ref 0.57–1.00)
GFR calc Af Amer: 102 mL/min/{1.73_m2} (ref >59)
GFR calc non Af Amer: 88 mL/min/{1.73_m2} (ref >59)
Globulin, Total: 2.7 g/dL (ref 1.5–4.5)
Glucose: 118 mg/dL — ABNORMAL HIGH (ref 65–99)
Potassium: 4 mmol/L (ref 3.5–5.2)
Sodium: 140 mmol/L (ref 134–144)
Total Protein: 7.4 g/dL (ref 6.0–8.5)

## 2019-12-23 LAB — CBC WITH DIFFERENTIAL/PLATELET
Basophils Absolute: 0 10*3/uL (ref 0.0–0.2)
Basos: 1 %
EOS (ABSOLUTE): 0.3 10*3/uL (ref 0.0–0.4)
Eos: 5 %
Hematocrit: 39.5 % (ref 34.0–46.6)
Hemoglobin: 13.1 g/dL (ref 11.1–15.9)
Immature Grans (Abs): 0 10*3/uL (ref 0.0–0.1)
Immature Granulocytes: 0 %
Lymphocytes Absolute: 3.1 10*3/uL (ref 0.7–3.1)
Lymphs: 42 %
MCH: 29.6 pg (ref 26.6–33.0)
MCHC: 33.2 g/dL (ref 31.5–35.7)
MCV: 89 fL (ref 79–97)
Monocytes Absolute: 0.4 10*3/uL (ref 0.1–0.9)
Monocytes: 5 %
Neutrophils Absolute: 3.5 10*3/uL (ref 1.4–7.0)
Neutrophils: 47 %
Platelets: 262 10*3/uL (ref 150–450)
RBC: 4.43 x10E6/uL (ref 3.77–5.28)
RDW: 13.5 % (ref 11.7–15.4)
WBC: 7.3 10*3/uL (ref 3.4–10.8)

## 2019-12-23 LAB — VITAMIN D 25 HYDROXY (VIT D DEFICIENCY, FRACTURES): Vit D, 25-Hydroxy: 32.5 ng/mL (ref 30.0–100.0)

## 2019-12-23 LAB — IRON,TIBC AND FERRITIN PANEL
Ferritin: 192 ng/mL — ABNORMAL HIGH (ref 15–150)
Iron Saturation: 34 % (ref 15–55)
Iron: 105 ug/dL (ref 27–159)
Total Iron Binding Capacity: 305 ug/dL (ref 250–450)
UIBC: 200 ug/dL (ref 131–425)

## 2019-12-23 LAB — TSH: TSH: 1.29 u[IU]/mL (ref 0.450–4.500)

## 2019-12-23 LAB — B12 AND FOLATE PANEL
Folate: 6.1 ng/mL (ref >3.0)
Vitamin B-12: 253 pg/mL (ref 232–1245)

## 2019-12-23 LAB — T4, FREE: Free T4: 1.14 ng/dL (ref 0.82–1.77)

## 2019-12-24 ENCOUNTER — Encounter: Payer: BC Managed Care – PPO | Admitting: Adult Health

## 2019-12-24 ENCOUNTER — Telehealth: Payer: Self-pay

## 2019-12-24 NOTE — Telephone Encounter (Signed)
Left message with [facility] dept to schedule holter monitor. Kristen Sparks

## 2019-12-27 NOTE — Progress Notes (Signed)
Discuss labs on next visit

## 2019-12-30 ENCOUNTER — Other Ambulatory Visit: Payer: Self-pay

## 2019-12-30 ENCOUNTER — Ambulatory Visit
Admission: RE | Admit: 2019-12-30 | Discharge: 2019-12-30 | Disposition: A | Discharge status: Home / Self Care | Payer: BC Managed Care – PPO | Source: Ambulatory Visit | Attending: Adult Health | Admitting: Adult Health

## 2019-12-30 DIAGNOSIS — I499 Cardiac arrhythmia, unspecified: Secondary | ICD-10-CM | POA: Diagnosis not present

## 2020-01-09 DIAGNOSIS — F419 Anxiety disorder, unspecified: Secondary | ICD-10-CM | POA: Diagnosis not present

## 2020-01-09 DIAGNOSIS — Z9049 Acquired absence of other specified parts of digestive tract: Secondary | ICD-10-CM | POA: Diagnosis not present

## 2020-01-09 DIAGNOSIS — I10 Essential (primary) hypertension: Secondary | ICD-10-CM | POA: Diagnosis not present

## 2020-01-09 DIAGNOSIS — Z6841 Body Mass Index (BMI) 40.0 and over, adult: Secondary | ICD-10-CM | POA: Diagnosis not present

## 2020-01-09 DIAGNOSIS — K0889 Other specified disorders of teeth and supporting structures: Secondary | ICD-10-CM | POA: Diagnosis not present

## 2020-01-09 DIAGNOSIS — Z880 Allergy status to penicillin: Secondary | ICD-10-CM | POA: Diagnosis not present

## 2020-01-09 DIAGNOSIS — R110 Nausea: Secondary | ICD-10-CM | POA: Diagnosis not present

## 2020-01-09 DIAGNOSIS — R519 Headache, unspecified: Secondary | ICD-10-CM | POA: Diagnosis not present

## 2020-01-09 DIAGNOSIS — Z79899 Other long term (current) drug therapy: Secondary | ICD-10-CM | POA: Diagnosis not present

## 2020-01-11 ENCOUNTER — Ambulatory Visit: Payer: BC Managed Care – PPO | Admitting: Adult Health

## 2020-01-11 ENCOUNTER — Encounter: Payer: Self-pay | Admitting: Adult Health

## 2020-01-11 ENCOUNTER — Other Ambulatory Visit: Payer: Self-pay

## 2020-01-11 VITALS — BP 194/118 | HR 124 | Temp 97.5°F | Resp 16 | Ht 63.0 in | Wt 223.0 lb

## 2020-01-11 DIAGNOSIS — H6021 Malignant otitis externa, right ear: Secondary | ICD-10-CM | POA: Diagnosis not present

## 2020-01-11 DIAGNOSIS — M273 Alveolitis of jaws: Secondary | ICD-10-CM

## 2020-01-11 MED ORDER — HYDROCODONE-ACETAMINOPHEN 7.5-300 MG PO TABS: 1.0000 | ORAL_TABLET | Freq: Four times a day (QID) | ORAL | 0 refills | Status: DC | PRN

## 2020-01-11 MED ORDER — CIPROFLOXACIN-DEXAMETHASONE 0.3-0.1 % OT SUSP: 4.0000 [drp] | Freq: Two times a day (BID) | OTIC | 0 refills | Status: DC

## 2020-01-11 MED ORDER — HYDROCODONE-ACETAMINOPHEN 10-325 MG PO TABS: 1.0000 | ORAL_TABLET | Freq: Four times a day (QID) | ORAL | 0 refills | Status: AC | PRN

## 2020-01-11 NOTE — Progress Notes (Signed)
[facility] [place], [place] [address]  Internal MEDICINE  Office Visit Note  Patient Name: Kristen Sparks  [id number]  [id number]  Date of Service: 01/11/2020  Chief Complaint  Patient presents with  . Acute Visit    dry socket     HPI Pt is here for a sick visit.  Pt reports she had a tooth pulled on 01/06/2020. She developed, dry socket.  Her dentist packed it and put medication in it two days later.  Since then she has been to the ER for pain management.  She does not appear to have an infection at the site of the extraction.  Her tissue is not red or swollen.  The site is well approximated.  Her right ear canal is red on exam.  She is very tearfull and anxious in the room.  Her blood pressure is elevated.  She has not had any pain relief since she was seen in the ED.  She did take a friends hydrocodone today, she reports approximately 30 minutes of relief.        Current Medication:  Outpatient Encounter Medications as of 01/11/2020  Medication Sig  . ALPRAZolam (XANAX) 0.5 MG tablet Take 1/2 t o1 tablet po QD prn  . benazepril (LOTENSIN) 20 MG tablet Take 0.5 tablets (10 mg total) by mouth daily.  Marland Kitchen omeprazole (PRILOSEC) 40 MG capsule Take 1 capsule (40 mg total) by mouth 2 (two) times daily.  . sertraline (ZOLOFT) 100 MG tablet TAKE 1 TABLET(100 MG) BY MOUTH DAILY  . traMADol (ULTRAM) 50 MG tablet TAKE 1 TABLET BY MOUTH TWICE DAILY AS NEEDED ONLY FOR SEVERE PAIN   No facility-administered encounter medications on file as of 01/11/2020.      Medical History: Past Medical History:  Diagnosis Date  . Anxiety   . Asthma   . Depression   . Environmental allergies   . Hyperlipidemia   . Hypertension   . Rheumatoid arthritis ([place])   . Sleep apnea 2019   Borderline  . Type 2 diabetes mellitus (HCC)      Vital Signs: BP (!) 194/118   Pulse (!) 124   Temp (!) 97.5 F (36.4 C)   Resp 16   Ht 5\' 3"  (1.6 m)   Wt 223 lb (101.2 kg)   SpO2  95%   BMI 39.50 kg/m    Review of Systems  Constitutional: Negative for chills, fatigue and unexpected weight change.  HENT: Negative for congestion, rhinorrhea, sneezing and sore throat.   Eyes: Negative for photophobia, pain and redness.  Respiratory: Negative for cough, chest tightness and shortness of breath.   Cardiovascular: Negative for chest pain and palpitations.  Gastrointestinal: Negative for abdominal pain, constipation, diarrhea, nausea and vomiting.  Endocrine: Negative.   Genitourinary: Negative for dysuria and frequency.  Musculoskeletal: Negative for arthralgias, back pain, joint swelling and neck pain.  Skin: Negative for rash.  Allergic/Immunologic: Negative.   Neurological: Negative for tremors and numbness.  Hematological: Negative for adenopathy. Does not bruise/bleed easily.  Psychiatric/Behavioral: Negative for behavioral problems and sleep disturbance. The patient is not nervous/anxious.     Physical Exam Vitals and nursing note reviewed.  Constitutional:      General: She is not in acute distress.    Appearance: She is well-developed. She is not diaphoretic.  HENT:     Head: Normocephalic and atraumatic.     Right Ear: Decreased hearing noted. Swelling present. Tympanic membrane is injected.  Left Ear: Tympanic membrane normal.     Mouth/Throat:     Pharynx: No oropharyngeal exudate.  Eyes:     Pupils: Pupils are equal, round, and reactive to light.  Neck:     Thyroid: No thyromegaly.     Vascular: No JVD.     Trachea: No tracheal deviation.  Cardiovascular:     Rate and Rhythm: Normal rate and regular rhythm.     Heart sounds: Normal heart sounds. No murmur heard.  No friction rub. No gallop.   Pulmonary:     Effort: Pulmonary effort is normal. No respiratory distress.     Breath sounds: Normal breath sounds. No wheezing or rales.  Chest:     Chest wall: No tenderness.  Abdominal:     Palpations: Abdomen is soft.     Tenderness: There is  no abdominal tenderness. There is no guarding.  Musculoskeletal:        General: Normal range of motion.     Cervical back: Normal range of motion and neck supple.  Lymphadenopathy:     Cervical: No cervical adenopathy.  Skin:    General: Skin is warm and dry.  Neurological:     Mental Status: She is alert and oriented to person, place, and time.     Cranial Nerves: No cranial nerve deficit.  Psychiatric:        Behavior: Behavior normal.        Thought Content: Thought content normal.        Judgment: Judgment normal.    Assessment/Plan: 1. Acute malignant otitis externa of right ear Use drops and pain medication as directed.  Reviewed risks and possible side effects associated with taking opiates, benzodiazepines and other CNS depressants. Combination of these could cause dizziness and drowsiness. Advised patient not to drive or operate machinery when taking these medications, as patient's and other's life can be at risk and will have consequences. Patient verbalized understanding in this matter. Dependence and abuse for these drugs will be monitored closely. A Controlled substance policy and procedure is on file which allows [facility] to order a urine drug screen test at any visit. Patient understands and agrees with the plan - ciprofloxacin-dexamethasone (CIPRODEX) OTIC suspension; Place 4 drops into the right ear 2 (two) times daily.  Dispense: 7.5 mL; Refill: 0 - HYDROcodone-acetaminophen (NORCO) 10-325 MG tablet; Take 1 tablet by mouth every 6 (six) hours as needed for up to 5 days.  Dispense: 15 tablet; Refill: 0  2. Dry tooth socket Appears resolved, continue to monitor.   General Counseling: Kristen Sparks verbalizes understanding of the findings of todays visit and agrees with plan of treatment. I have discussed any further diagnostic evaluation that may be needed or ordered today. We also reviewed her medications today. she has been encouraged to call the office with any  questions or concerns that should arise related to todays visit.   No orders of the defined types were placed in this encounter.   No orders of the defined types were placed in this encounter.   Time spent: 20 Minutes  This patient was seen by Orson Gear AGNP-C in Collaboration with Dr Lavera Guise as a part of collaborative care agreement.  Kendell Bane AGNP-C Internal Medicine

## 2020-01-14 ENCOUNTER — Telehealth: Payer: Self-pay

## 2020-01-14 NOTE — Telephone Encounter (Signed)
Patient cancelled appointment on 01/15/2020 will call back to reschedule. klh

## 2020-01-15 ENCOUNTER — Telehealth: Payer: Self-pay

## 2020-01-15 ENCOUNTER — Other Ambulatory Visit: Payer: BC Managed Care – PPO

## 2020-01-15 DIAGNOSIS — M26641 Arthritis of right temporomandibular joint: Secondary | ICD-10-CM | POA: Diagnosis not present

## 2020-01-15 DIAGNOSIS — H9201 Otalgia, right ear: Secondary | ICD-10-CM | POA: Diagnosis not present

## 2020-01-15 NOTE — Telephone Encounter (Signed)
Patient cancelled appointment on 01/18/2020. klh 

## 2020-01-18 ENCOUNTER — Ambulatory Visit: Payer: BC Managed Care – PPO | Admitting: Adult Health

## 2020-01-25 ENCOUNTER — Ambulatory Visit: Payer: BC Managed Care – PPO | Admitting: Adult Health

## 2020-01-27 ENCOUNTER — Other Ambulatory Visit: Payer: Self-pay

## 2020-01-27 ENCOUNTER — Telehealth: Payer: Self-pay

## 2020-01-27 MED ORDER — PREDNISONE 10 MG (21) PO TBPK: 10.0000 mg | ORAL_TABLET | Freq: Every day | ORAL | 0 refills | Status: DC

## 2020-01-27 MED ORDER — PREDNISONE 10 MG (21) PO TBPK: ORAL_TABLET | ORAL | 0 refills | Status: DC

## 2020-01-27 NOTE — Telephone Encounter (Signed)
Pt called  That poison ivy as per adam send prednisone

## 2020-02-01 ENCOUNTER — Other Ambulatory Visit: Payer: Self-pay

## 2020-02-01 ENCOUNTER — Other Ambulatory Visit: Payer: Self-pay | Admitting: Adult Health

## 2020-02-01 DIAGNOSIS — M1991 Primary osteoarthritis, unspecified site: Secondary | ICD-10-CM

## 2020-02-01 DIAGNOSIS — K2100 Gastro-esophageal reflux disease with esophagitis, without bleeding: Secondary | ICD-10-CM

## 2020-02-01 DIAGNOSIS — F411 Generalized anxiety disorder: Secondary | ICD-10-CM

## 2020-02-01 MED ORDER — OMEPRAZOLE 40 MG PO CPDR: 40.0000 mg | DELAYED_RELEASE_CAPSULE | Freq: Two times a day (BID) | ORAL | 0 refills | Status: DC

## 2020-02-01 NOTE — Telephone Encounter (Signed)
Pt is overdue for hr AWV, she needs app, will refill her rx for one month

## 2020-03-04 ENCOUNTER — Other Ambulatory Visit: Payer: Self-pay | Admitting: Adult Health

## 2020-03-04 DIAGNOSIS — M1991 Primary osteoarthritis, unspecified site: Secondary | ICD-10-CM

## 2020-03-04 MED ORDER — TRAMADOL HCL 50 MG PO TABS: ORAL_TABLET | ORAL | 0 refills | Status: DC

## 2020-03-04 NOTE — Progress Notes (Signed)
Sent refill on tramadol at this time

## 2020-03-16 ENCOUNTER — Encounter: Payer: BC Managed Care – PPO | Admitting: Internal Medicine

## 2020-03-18 ENCOUNTER — Ambulatory Visit (INDEPENDENT_AMBULATORY_CARE_PROVIDER_SITE_OTHER): Payer: BC Managed Care – PPO | Admitting: Adult Health

## 2020-03-18 ENCOUNTER — Encounter: Payer: Self-pay | Admitting: Adult Health

## 2020-03-18 ENCOUNTER — Other Ambulatory Visit: Payer: Self-pay

## 2020-03-18 VITALS — BP 136/83 | HR 78 | Temp 98.0°F | Resp 16 | Ht 63.0 in | Wt 226.4 lb

## 2020-03-18 DIAGNOSIS — R300 Dysuria: Secondary | ICD-10-CM | POA: Diagnosis not present

## 2020-03-18 DIAGNOSIS — F339 Major depressive disorder, recurrent, unspecified: Secondary | ICD-10-CM

## 2020-03-18 DIAGNOSIS — M1991 Primary osteoarthritis, unspecified site: Secondary | ICD-10-CM

## 2020-03-18 DIAGNOSIS — I10 Essential (primary) hypertension: Secondary | ICD-10-CM

## 2020-03-18 DIAGNOSIS — G4733 Obstructive sleep apnea (adult) (pediatric): Secondary | ICD-10-CM

## 2020-03-18 DIAGNOSIS — Z0001 Encounter for general adult medical examination with abnormal findings: Secondary | ICD-10-CM

## 2020-03-18 DIAGNOSIS — K2100 Gastro-esophageal reflux disease with esophagitis, without bleeding: Secondary | ICD-10-CM | POA: Diagnosis not present

## 2020-03-18 DIAGNOSIS — E782 Mixed hyperlipidemia: Secondary | ICD-10-CM

## 2020-03-18 DIAGNOSIS — F411 Generalized anxiety disorder: Secondary | ICD-10-CM

## 2020-03-18 DIAGNOSIS — E119 Type 2 diabetes mellitus without complications: Secondary | ICD-10-CM | POA: Diagnosis not present

## 2020-03-18 MED ORDER — TRAMADOL HCL 50 MG PO TABS: ORAL_TABLET | ORAL | 0 refills | Status: DC

## 2020-03-18 MED ORDER — SERTRALINE HCL 100 MG PO TABS: 100.0000 mg | ORAL_TABLET | Freq: Every day | ORAL | 1 refills | Status: DC

## 2020-03-18 MED ORDER — OMEPRAZOLE 40 MG PO CPDR: 40.0000 mg | DELAYED_RELEASE_CAPSULE | Freq: Two times a day (BID) | ORAL | 3 refills | Status: DC

## 2020-03-18 NOTE — Progress Notes (Signed)
[facility] [place], [place] [address]  Internal MEDICINE  Office Visit Note  Patient Name: Kristen Sparks  [id number]  [id number]  Date of Service: 03/31/2020  Chief Complaint  Patient presents with  . Annual Exam    urine has odor  . Diabetes  . Depression  . Hyperlipidemia  . Hypertension  . Sleep Apnea  . Quality Metric Gaps    HepC, TDAP     HPI Pt is here for routine health maintenance examination.  She is a well appearing 59 yo female.  She has a history of DM, Depression ,HLD, HTN, GERD, and osa.  Her DM remains diet controlled with an A1C of 6.9.  Her bp is controlled.  GI doctor, abnormal bm's.  Epigastric pain. Gi doctor retired. Current Medication: Outpatient Encounter Medications as of 03/18/2020  Medication Sig  . ALPRAZolam (XANAX) 0.5 MG tablet Take 1/2 t o1 tablet po QD prn  . benazepril (LOTENSIN) 20 MG tablet Take 0.5 tablets (10 mg total) by mouth daily.  Marland Kitchen omeprazole (PRILOSEC) 40 MG capsule Take 1 capsule (40 mg total) by mouth 2 (two) times daily.  . predniSONE (STERAPRED UNI-PAK 21 TAB) 10 MG (21) TBPK tablet Use as directed for 6 days  . sertraline (ZOLOFT) 100 MG tablet Take 1 tablet (100 mg total) by mouth daily.  Derrill Memo ON 04/02/2020] traMADol (ULTRAM) 50 MG tablet TAKE 1 TABLET BY MOUTH TWICE DAILY AS NEEDED FOR SEVERE PAIN  . [START ON 05/03/2020] traMADol (ULTRAM) 50 MG tablet TAKE 1 TABLET BY MOUTH TWICE DAILY AS NEEDED FOR SEVERE PAIN  . [DISCONTINUED] omeprazole (PRILOSEC) 40 MG capsule Take 1 capsule (40 mg total) by mouth 2 (two) times daily.  . [DISCONTINUED] sertraline (ZOLOFT) 100 MG tablet TAKE 1 TABLET(100 MG) BY MOUTH DAILY  . [DISCONTINUED] traMADol (ULTRAM) 50 MG tablet TAKE 1 TABLET BY MOUTH TWICE DAILY AS NEEDED FOR SEVERE PAIN  . [DISCONTINUED] traMADol (ULTRAM) 50 MG tablet TAKE 1 TABLET BY MOUTH TWICE DAILY AS NEEDED FOR SEVERE PAIN  . ciprofloxacin-dexamethasone (CIPRODEX) OTIC suspension Place 4 drops  into the right ear 2 (two) times daily. (Patient not taking: Reported on 03/18/2020)  . [DISCONTINUED] nitrofurantoin, macrocrystal-monohydrate, (MACROBID) 100 MG capsule Take 1 capsule (100 mg total) by mouth 2 (two) times daily.   No facility-administered encounter medications on file as of 03/18/2020.    Surgical History: Past Surgical History:  Procedure Laterality Date  . CHOLECYSTECTOMY  2000  . colon polyectomy    . COLONOSCOPY WITH PROPOFOL N/A 03/31/2018   Procedure: COLONOSCOPY WITH PROPOFOL;  Surgeon: Jonathon Bellows, MD;  Location: [facility] ENDOSCOPY;  Service: Gastroenterology;  Laterality: N/A;  . FOOT SURGERY      Medical History: Past Medical History:  Diagnosis Date  . Anxiety   . Asthma   . Depression   . Environmental allergies   . Hyperlipidemia   . Hypertension   . Rheumatoid arthritis ([place])   . Sleep apnea 2019   Borderline  . Type 2 diabetes mellitus (HCC)     Family History: Family History  Problem Relation Age of Onset  . Diabetes Mother   . COPD Mother   . Uterine cancer Mother 20  . Cancer Sister 55       Bile Duct  . Breast cancer Neg Hx       Review of Systems  Constitutional: Negative for chills, fatigue and unexpected weight change.  HENT: Negative for congestion, rhinorrhea, sneezing and sore throat.  Eyes: Negative for photophobia, pain and redness.  Respiratory: Negative for cough, chest tightness and shortness of breath.   Cardiovascular: Negative for chest pain and palpitations.  Gastrointestinal: Negative for abdominal pain, constipation, diarrhea, nausea and vomiting.  Endocrine: Negative.   Genitourinary: Negative for dysuria and frequency.  Musculoskeletal: Negative for arthralgias, back pain, joint swelling and neck pain.  Skin: Negative for rash.  Allergic/Immunologic: Negative.   Neurological: Negative for tremors and numbness.  Hematological: Negative for adenopathy. Does not bruise/bleed easily.  Psychiatric/Behavioral:  Negative for behavioral problems and sleep disturbance. The patient is not nervous/anxious.      Vital Signs: BP 136/83   Pulse 78   Temp 98 F (36.7 C)   Resp 16   Ht 5\' 3"  (1.6 m)   Wt 226 lb 6.4 oz (102.7 kg)   SpO2 94%   BMI 40.10 kg/m    Physical Exam Vitals and nursing note reviewed.  Constitutional:      General: She is not in acute distress.    Appearance: She is well-developed. She is not diaphoretic.  HENT:     Head: Normocephalic and atraumatic.     Mouth/Throat:     Pharynx: No oropharyngeal exudate.  Eyes:     Pupils: Pupils are equal, round, and reactive to light.  Neck:     Thyroid: No thyromegaly.     Vascular: No JVD.     Trachea: No tracheal deviation.  Cardiovascular:     Rate and Rhythm: Normal rate and regular rhythm.     Heart sounds: Normal heart sounds. No murmur heard.  No friction rub. No gallop.   Pulmonary:     Effort: Pulmonary effort is normal. No respiratory distress.     Breath sounds: Normal breath sounds. No wheezing or rales.  Chest:     Chest wall: No tenderness.  Abdominal:     Palpations: Abdomen is soft.     Tenderness: There is no abdominal tenderness. There is no guarding.  Musculoskeletal:        General: Normal range of motion.     Cervical back: Normal range of motion and neck supple.  Lymphadenopathy:     Cervical: No cervical adenopathy.  Skin:    General: Skin is warm and dry.  Neurological:     Mental Status: She is alert and oriented to person, place, and time.     Cranial Nerves: No cranial nerve deficit.  Psychiatric:        Behavior: Behavior normal.        Thought Content: Thought content normal.        Judgment: Judgment normal.      LABS: Recent Results (from the past 2160 hour(s))  UA/M w/rflx Culture, Routine     Status: Abnormal   Collection Time: 03/18/20  9:46 AM   Specimen: Urine   Urine  Result Value Ref Range   Specific Gravity, UA 1.018 1.005 - 1.030   pH, UA 7.0 5.0 - 7.5   Color,  UA Yellow Yellow   Appearance Ur Clear Clear   Leukocytes,UA Trace (A) Negative   Protein,UA Negative Negative/Trace   Glucose, UA Negative Negative   Ketones, UA Negative Negative   RBC, UA Negative Negative   Bilirubin, UA Negative Negative   Urobilinogen, Ur 0.2 0.2 - 1.0 mg/dL   Nitrite, UA Negative Negative   Microscopic Examination See below:     Comment: Microscopic was indicated and was performed.   Urinalysis Reflex Comment  Comment: This specimen has reflexed to a Urine Culture.  Microscopic Examination     Status: Abnormal   Collection Time: 03/18/20  9:46 AM   Urine  Result Value Ref Range   WBC, UA 6-10 (A) 0 - 5 /hpf   RBC None seen 0 - 2 /hpf   Epithelial Cells (non renal) 0-10 0 - 10 /hpf   Casts None seen None seen /lpf   Bacteria, UA Many (A) None seen/Few  Urine Culture, Reflex     Status: Abnormal   Collection Time: 03/18/20  9:46 AM   Urine  Result Value Ref Range   Urine Culture, Routine Final report (A)    Organism ID, Bacteria Escherichia coli (A)     Comment: Cefazolin <=4 ug/mL Cefazolin with an MIC <=16 predicts susceptibility to the oral agents cefaclor, cefdinir, cefpodoxime, cefprozil, cefuroxime, cephalexin, and loracarbef when used for therapy of uncomplicated urinary tract infections due to E. coli, Klebsiella pneumoniae, and Proteus mirabilis. Greater than 100,000 colony forming units per mL    ORGANISM ID, BACTERIA Comment     Comment: Mixed urogenital flora 10,000-25,000 colony forming units per mL    Antimicrobial Susceptibility Comment     Comment:       ** S = Susceptible; I = Intermediate; R = Resistant **                    P = Positive; N = Negative             MICS are expressed in micrograms per mL    Antibiotic                 RSLT#1    RSLT#2    RSLT#3    RSLT#4 Amoxicillin/Clavulanic Acid    S Ampicillin                     S Cefepime                       S Ceftriaxone                    S Cefuroxime                      S Ciprofloxacin                  S Ertapenem                      S Gentamicin                     S Imipenem                       S Levofloxacin                   S Meropenem                      S Nitrofurantoin                 S Piperacillin/Tazobactam        S Tetracycline                   S Tobramycin                     S Trimethoprim/Sulfa  S     Assessment/Plan: 1. Encounter for general adult medical examination with abnormal findings Up to date on PHM  2. Diabetes mellitus without complication (HCC) F7T well controlled at 6.9, continue current medications, and once again discussed lifestyle modifications and dietary management.  - POCT HgB A1C  3. Essential (primary) hypertension Stable, continue present medications.   4. Gastroesophageal reflux disease with esophagitis without hemorrhage Good control of symptoms, continue to use Prilosec as prescribed.  - omeprazole (PRILOSEC) 40 MG capsule; Take 1 capsule (40 mg total) by mouth 2 (two) times daily.  Dispense: 60 capsule; Refill: 3 - Ambulatory referral to Gastroenterology  5. Mixed hyperlipidemia Continue with medications and lifestyle modification as prescribed.   6. OSA (obstructive sleep apnea) Continue to use cpap as prescribed.  Denies symptoms including hypersomnia  7. Primary localized osteoarthrosis Reviewed risks and possible side effects associated with taking opiates, benzodiazepines and other CNS depressants. Combination of these could cause dizziness and drowsiness. Advised patient not to drive or operate machinery when taking these medications, as patient's and other's life can be at risk and will have consequences. Patient verbalized understanding in this matter. Dependence and abuse for these drugs will be monitored closely. A Controlled substance policy and procedure is on file which allows [facility] to order a urine drug screen test at any visit. Patient understands and  agrees with the plan - traMADol (ULTRAM) 50 MG tablet; TAKE 1 TABLET BY MOUTH TWICE DAILY AS NEEDED FOR SEVERE PAIN  Dispense: 30 tablet; Refill: 0 - traMADol (ULTRAM) 50 MG tablet; TAKE 1 TABLET BY MOUTH TWICE DAILY AS NEEDED FOR SEVERE PAIN  Dispense: 30 tablet; Refill: 0  8. Generalized anxiety disorder Good control of symptoms, use zoloft as prescirbed - sertraline (ZOLOFT) 100 MG tablet; Take 1 tablet (100 mg total) by mouth daily.  Dispense: 90 tablet; Refill: 1  9. Depression, recurrent ([place]) Stable, currently continue to monitor and use zoloft as prescribed.   10. Dysuria - UA/M w/rflx Culture, Routine   General Counseling: Silvanna verbalizes understanding of the findings of todays visit and agrees with plan of treatment. I have discussed any further diagnostic evaluation that may be needed or ordered today. We also reviewed her medications today. she has been encouraged to call the office with any questions or concerns that should arise related to todays visit.   Orders Placed This Encounter  Procedures  . Microscopic Examination  . Urine Culture, Reflex  . UA/M w/rflx Culture, Routine  . Ambulatory referral to Gastroenterology  . POCT HgB A1C    Meds ordered this encounter  Medications  . traMADol (ULTRAM) 50 MG tablet    Sig: TAKE 1 TABLET BY MOUTH TWICE DAILY AS NEEDED FOR SEVERE PAIN    Dispense:  30 tablet    Refill:  0    Do not fill before 04/02/2020  . traMADol (ULTRAM) 50 MG tablet    Sig: TAKE 1 TABLET BY MOUTH TWICE DAILY AS NEEDED FOR SEVERE PAIN    Dispense:  30 tablet    Refill:  0    Do not fill before 05/03/2020  . omeprazole (PRILOSEC) 40 MG capsule    Sig: Take 1 capsule (40 mg total) by mouth 2 (two) times daily.    Dispense:  60 capsule    Refill:  3  . sertraline (ZOLOFT) 100 MG tablet    Sig: Take 1 tablet (100 mg total) by mouth daily.    Dispense:  90 tablet  Refill:  1  . DISCONTD: nitrofurantoin, macrocrystal-monohydrate, (MACROBID) 100  MG capsule    Sig: Take 1 capsule (100 mg total) by mouth 2 (two) times daily.    Dispense:  10 capsule    Refill:  0    Time spent: 25 Minutes   This patient was seen by Orson Gear AGNP-C in Collaboration with Dr Lavera Guise as a part of collaborative care agreement    Kendell Bane AGNP-C Internal Medicine

## 2020-03-22 LAB — UA/M W/RFLX CULTURE, ROUTINE
Bilirubin, UA: NEGATIVE
Glucose, UA: NEGATIVE
Ketones, UA: NEGATIVE
Nitrite, UA: NEGATIVE
Protein,UA: NEGATIVE
RBC, UA: NEGATIVE
Specific Gravity, UA: 1.018 (ref 1.005–1.030)
Urobilinogen, Ur: 0.2 mg/dL (ref 0.2–1.0)
pH, UA: 7 (ref 5.0–7.5)

## 2020-03-22 LAB — MICROSCOPIC EXAMINATION
Casts: NONE SEEN /lpf
RBC, Urine: NONE SEEN /hpf (ref 0–2)

## 2020-03-22 LAB — URINE CULTURE, REFLEX

## 2020-03-30 MED ORDER — NITROFURANTOIN MONOHYD MACRO 100 MG PO CAPS: 100.0000 mg | ORAL_CAPSULE | Freq: Two times a day (BID) | ORAL | 0 refills | Status: DC

## 2020-03-30 NOTE — Progress Notes (Signed)
Review urine and treat as indicated, pt had borderline low b12, will need to be monitored for possible replacement

## 2020-03-31 ENCOUNTER — Telehealth: Payer: Self-pay

## 2020-03-31 ENCOUNTER — Other Ambulatory Visit: Payer: Self-pay

## 2020-03-31 DIAGNOSIS — R300 Dysuria: Secondary | ICD-10-CM

## 2020-03-31 LAB — POCT GLYCOSYLATED HEMOGLOBIN (HGB A1C): Hemoglobin A1C: 6.9 % — AB (ref 4.0–5.6)

## 2020-03-31 MED ORDER — NITROFURANTOIN MONOHYD MACRO 100 MG PO CAPS: 100.0000 mg | ORAL_CAPSULE | Freq: Two times a day (BID) | ORAL | 0 refills | Status: DC

## 2020-03-31 NOTE — Telephone Encounter (Signed)
-----   Message from Kendell Bane, NP sent at 03/30/2020  5:20 PM EDT ----- I sent her macrobid for a uti

## 2020-03-31 NOTE — Telephone Encounter (Signed)
Pt advised urine showed UTI we send macrobid to phar

## 2020-04-07 ENCOUNTER — Other Ambulatory Visit: Payer: Self-pay | Admitting: Internal Medicine

## 2020-04-07 ENCOUNTER — Telehealth: Payer: Self-pay

## 2020-04-07 NOTE — Telephone Encounter (Signed)
Advise pt that this medicine is to be taken as needed not 2 x day every day, she was given additional refill

## 2020-04-08 NOTE — Telephone Encounter (Signed)
Thank you :)

## 2020-04-08 NOTE — Telephone Encounter (Signed)
I spoke to pt and informed her of what you advised, pt states she has always taken them that way.  I advised her of our new controlled substance policy and informed her that if the medication isn't working then she will have to make an appt to discuss with the provider.  dbs

## 2020-04-11 ENCOUNTER — Telehealth: Payer: Self-pay

## 2020-04-11 NOTE — Telephone Encounter (Signed)
Pt called and informed us that she was tested today for covid and was positive.  She was advised to call hospital, health dept or [employer] [facility] to check on antibody infusion.  dbs

## 2020-04-15 ENCOUNTER — Other Ambulatory Visit (HOSPITAL_COMMUNITY): Payer: Self-pay | Admitting: Physician Assistant

## 2020-04-15 ENCOUNTER — Telehealth (HOSPITAL_COMMUNITY): Payer: Self-pay

## 2020-04-15 ENCOUNTER — Encounter: Payer: Self-pay | Admitting: Physician Assistant

## 2020-04-15 NOTE — Telephone Encounter (Signed)
Called to Discuss with patient about Covid symptoms and the use of the monoclonal antibody infusion for those with mild to moderate Covid symptoms and at a high risk of hospitalization.     Pt appears to qualify for this infusion due to co-morbid conditions and/or a member of an at-risk group in accordance with the FDA Emergency Use Authorization.    Pt stated her symptoms started on 04/10/2020 and she tested positive with a home test on 04/11/2020. Pt states her symptoms include cough, fatigue and fever. Qualifying risk factors include HTN, DM, and obesity. Pt informed that an APP will call her back to schedule.

## 2020-04-15 NOTE — Progress Notes (Signed)
I connected by phone with Kristen Sparks on 04/15/2020 at 5:40 PM to discuss the potential use of a new treatment for mild to moderate COVID-19 viral infection in non-hospitalized patients.  This patient is a 59 y.o. female that meets the FDA criteria for Emergency Use Authorization of COVID monoclonal antibody casirivimab/imdevimab or bamlanivimab/eteseviamb.  Has a (+) direct SARS-CoV-2 viral test result  Has mild or moderate COVID-19   Is NOT hospitalized due to COVID-19  Is within 10 days of symptom onset  Has at least one of the high risk factor(s) for progression to severe COVID-19 and/or hospitalization as defined in EUA.  Specific high risk criteria : BMI > 25, Diabetes and Cardiovascular disease or hypertension   I have spoken and communicated the following to the patient or parent/caregiver regarding COVID monoclonal antibody treatment:  1. FDA has authorized the emergency use for the treatment of mild to moderate COVID-19 in adults and pediatric patients with positive results of direct SARS-CoV-2 viral testing who are 71 years of age and older weighing at least 40 kg, and who are at high risk for progressing to severe COVID-19 and/or hospitalization.  2. The significant known and potential risks and benefits of COVID monoclonal antibody, and the extent to which such potential risks and benefits are unknown.  3. Information on available alternative treatments and the risks and benefits of those alternatives, including clinical trials.  4. Patients treated with COVID monoclonal antibody should continue to self-isolate and use infection control measures (e.g., wear mask, isolate, social distance, avoid sharing personal items, clean and disinfect "high touch" surfaces, and frequent handwashing) according to CDC guidelines.   5. The patient or parent/caregiver has the option to accept or refuse COVID monoclonal antibody treatment.  After reviewing this information with the patient,  the patient has agreed to receive one of the available covid 19 monoclonal antibodies and will be provided an appropriate fact sheet prior to infusion. Konrad Felix, PA-C 04/15/2020 5:40 PM

## 2020-04-16 ENCOUNTER — Ambulatory Visit (HOSPITAL_COMMUNITY)
Admission: RE | Admit: 2020-04-16 | Discharge: 2020-04-16 | Disposition: A | Discharge status: Home / Self Care | Payer: BC Managed Care – PPO | Source: Ambulatory Visit | Attending: Pulmonary Disease | Admitting: Pulmonary Disease

## 2020-04-16 DIAGNOSIS — U071 COVID-19: Secondary | ICD-10-CM | POA: Insufficient documentation

## 2020-04-16 DIAGNOSIS — Z9049 Acquired absence of other specified parts of digestive tract: Secondary | ICD-10-CM | POA: Diagnosis not present

## 2020-04-16 DIAGNOSIS — Z885 Allergy status to narcotic agent status: Secondary | ICD-10-CM | POA: Diagnosis not present

## 2020-04-16 DIAGNOSIS — E860 Dehydration: Secondary | ICD-10-CM | POA: Insufficient documentation

## 2020-04-16 DIAGNOSIS — R000 Tachycardia, unspecified: Secondary | ICD-10-CM | POA: Diagnosis not present

## 2020-04-16 DIAGNOSIS — J90 Pleural effusion, not elsewhere classified: Secondary | ICD-10-CM | POA: Diagnosis not present

## 2020-04-16 DIAGNOSIS — R531 Weakness: Secondary | ICD-10-CM | POA: Insufficient documentation

## 2020-04-16 DIAGNOSIS — I10 Essential (primary) hypertension: Secondary | ICD-10-CM | POA: Diagnosis not present

## 2020-04-16 DIAGNOSIS — R109 Unspecified abdominal pain: Secondary | ICD-10-CM | POA: Insufficient documentation

## 2020-04-16 DIAGNOSIS — Z79899 Other long term (current) drug therapy: Secondary | ICD-10-CM | POA: Diagnosis not present

## 2020-04-16 DIAGNOSIS — R0789 Other chest pain: Secondary | ICD-10-CM | POA: Diagnosis not present

## 2020-04-16 DIAGNOSIS — R0981 Nasal congestion: Secondary | ICD-10-CM | POA: Diagnosis not present

## 2020-04-16 DIAGNOSIS — Z880 Allergy status to penicillin: Secondary | ICD-10-CM | POA: Diagnosis not present

## 2020-04-16 DIAGNOSIS — E119 Type 2 diabetes mellitus without complications: Secondary | ICD-10-CM | POA: Diagnosis not present

## 2020-04-16 DIAGNOSIS — R5383 Other fatigue: Secondary | ICD-10-CM | POA: Insufficient documentation

## 2020-04-16 DIAGNOSIS — F419 Anxiety disorder, unspecified: Secondary | ICD-10-CM | POA: Diagnosis not present

## 2020-04-16 DIAGNOSIS — E669 Obesity, unspecified: Secondary | ICD-10-CM | POA: Diagnosis not present

## 2020-04-16 DIAGNOSIS — J1282 Pneumonia due to coronavirus disease 2019: Secondary | ICD-10-CM | POA: Diagnosis not present

## 2020-04-16 DIAGNOSIS — Z881 Allergy status to other antibiotic agents status: Secondary | ICD-10-CM | POA: Diagnosis not present

## 2020-04-16 DIAGNOSIS — R059 Cough, unspecified: Secondary | ICD-10-CM | POA: Insufficient documentation

## 2020-04-16 MED ORDER — SODIUM CHLORIDE 0.9 % IV SOLN: INTRAVENOUS | Status: DC | PRN

## 2020-04-16 MED ORDER — FAMOTIDINE IN NACL 20-0.9 MG/50ML-% IV SOLN: 20.0000 mg | Freq: Once | INTRAVENOUS | Status: DC | PRN

## 2020-04-16 MED ORDER — SODIUM CHLORIDE 0.9 % IV SOLN: Freq: Once | INTRAVENOUS | Status: AC

## 2020-04-16 MED ORDER — EPINEPHRINE 0.3 MG/0.3ML IJ SOAJ: 0.3000 mg | Freq: Once | INTRAMUSCULAR | Status: DC | PRN

## 2020-04-16 MED ORDER — ALBUTEROL SULFATE HFA 108 (90 BASE) MCG/ACT IN AERS: 2.0000 | INHALATION_SPRAY | Freq: Once | RESPIRATORY_TRACT | Status: DC | PRN

## 2020-04-16 MED ORDER — DIPHENHYDRAMINE HCL 50 MG/ML IJ SOLN: 50.0000 mg | Freq: Once | INTRAMUSCULAR | Status: DC | PRN

## 2020-04-16 MED ORDER — METHYLPREDNISOLONE SODIUM SUCC 125 MG IJ SOLR: 125.0000 mg | Freq: Once | INTRAMUSCULAR | Status: DC | PRN

## 2020-04-16 NOTE — Progress Notes (Signed)
  Diagnosis: COVID-19  Physician: Humphrey Rolls  Procedure: Covid Infusion Clinic Med: bamlanivimab\etesevimab infusion - Provided patient with bamlanimivab\etesevimab fact sheet for patients, parents and caregivers prior to infusion.  Complications: No immediate complications noted.  Discharge: 1 liter of saline given. Discharged home   Kristen Sparks 04/16/2020 ,

## 2020-04-16 NOTE — Progress Notes (Signed)
HPI- Patient presents for monoclonal antibody infusion. Is high risk if severe illness due to asthma, diabetes, BMI.   Past Medical History:  Diagnosis Date  . Anxiety   . Asthma   . Depression   . Environmental allergies   . Hyperlipidemia   . Hypertension   . Rheumatoid arthritis ([place])   . Sleep apnea 2019   Borderline  . Type 2 diabetes mellitus (HCC)      Subjective- Patient reports significant cough that is not worsening, but has made her abdomen sore at rest. She vocalizes with exhalation but states this is due to soreness. No chest pain or SOB. Feels improved since symptom onset. Feels fatigued. She has not eaten in a few days. No history of CKD or heart failure.  O:  T- 99.2; BP 139/88. O2- 95% at rest and ambulating; HR 103  Physical Exam Vitals reviewed.  Cardiovascular:     Rate and Rhythm: Tachycardia present.  Pulmonary:     Breath sounds: Normal breath sounds.     Comments: Dry cough, no wheezing, no stridor, clear sounds throughout Neurological:     Mental Status: She is alert.    A: Moderate Covid 19 infection, weakness due to dehydration  P: Proceed with Monoclonal antibody infusion as ordered, will also give 1L normal saline  Mariana Kaufman, AGNP-C

## 2020-04-16 NOTE — Discharge Instructions (Signed)

## 2020-04-18 ENCOUNTER — Emergency Department: Payer: BC Managed Care – PPO

## 2020-04-18 ENCOUNTER — Encounter: Payer: Self-pay | Admitting: Emergency Medicine

## 2020-04-18 ENCOUNTER — Other Ambulatory Visit: Payer: Self-pay

## 2020-04-18 ENCOUNTER — Inpatient Hospital Stay
Admission: EM | Admit: 2020-04-18 | Discharge: 2020-04-23 | DRG: 177 | Disposition: A | Discharge status: Home Health | Payer: BC Managed Care – PPO | Attending: Internal Medicine | Admitting: Internal Medicine

## 2020-04-18 DIAGNOSIS — E1159 Type 2 diabetes mellitus with other circulatory complications: Secondary | ICD-10-CM | POA: Diagnosis present

## 2020-04-18 DIAGNOSIS — J9601 Acute respiratory failure with hypoxia: Secondary | ICD-10-CM | POA: Diagnosis not present

## 2020-04-18 DIAGNOSIS — K2100 Gastro-esophageal reflux disease with esophagitis, without bleeding: Secondary | ICD-10-CM | POA: Diagnosis present

## 2020-04-18 DIAGNOSIS — R197 Diarrhea, unspecified: Secondary | ICD-10-CM | POA: Diagnosis present

## 2020-04-18 DIAGNOSIS — Z881 Allergy status to other antibiotic agents status: Secondary | ICD-10-CM | POA: Diagnosis not present

## 2020-04-18 DIAGNOSIS — E669 Obesity, unspecified: Secondary | ICD-10-CM | POA: Diagnosis not present

## 2020-04-18 DIAGNOSIS — Z833 Family history of diabetes mellitus: Secondary | ICD-10-CM | POA: Diagnosis not present

## 2020-04-18 DIAGNOSIS — E1165 Type 2 diabetes mellitus with hyperglycemia: Secondary | ICD-10-CM | POA: Diagnosis not present

## 2020-04-18 DIAGNOSIS — Z79899 Other long term (current) drug therapy: Secondary | ICD-10-CM

## 2020-04-18 DIAGNOSIS — Z6841 Body Mass Index (BMI) 40.0 and over, adult: Secondary | ICD-10-CM

## 2020-04-18 DIAGNOSIS — U071 COVID-19: Secondary | ICD-10-CM | POA: Diagnosis not present

## 2020-04-18 DIAGNOSIS — M069 Rheumatoid arthritis, unspecified: Secondary | ICD-10-CM | POA: Diagnosis not present

## 2020-04-18 DIAGNOSIS — T380X5A Adverse effect of glucocorticoids and synthetic analogues, initial encounter: Secondary | ICD-10-CM | POA: Diagnosis not present

## 2020-04-18 DIAGNOSIS — R0602 Shortness of breath: Secondary | ICD-10-CM | POA: Diagnosis not present

## 2020-04-18 DIAGNOSIS — F418 Other specified anxiety disorders: Secondary | ICD-10-CM | POA: Diagnosis present

## 2020-04-18 DIAGNOSIS — J45909 Unspecified asthma, uncomplicated: Secondary | ICD-10-CM | POA: Diagnosis present

## 2020-04-18 DIAGNOSIS — R112 Nausea with vomiting, unspecified: Secondary | ICD-10-CM | POA: Diagnosis not present

## 2020-04-18 DIAGNOSIS — Z825 Family history of asthma and other chronic lower respiratory diseases: Secondary | ICD-10-CM | POA: Diagnosis not present

## 2020-04-18 DIAGNOSIS — E785 Hyperlipidemia, unspecified: Secondary | ICD-10-CM | POA: Diagnosis present

## 2020-04-18 DIAGNOSIS — R0781 Pleurodynia: Secondary | ICD-10-CM

## 2020-04-18 DIAGNOSIS — E119 Type 2 diabetes mellitus without complications: Secondary | ICD-10-CM | POA: Diagnosis not present

## 2020-04-18 DIAGNOSIS — J189 Pneumonia, unspecified organism: Secondary | ICD-10-CM | POA: Diagnosis not present

## 2020-04-18 DIAGNOSIS — I10 Essential (primary) hypertension: Secondary | ICD-10-CM | POA: Diagnosis not present

## 2020-04-18 DIAGNOSIS — E876 Hypokalemia: Secondary | ICD-10-CM | POA: Diagnosis not present

## 2020-04-18 DIAGNOSIS — R109 Unspecified abdominal pain: Secondary | ICD-10-CM

## 2020-04-18 DIAGNOSIS — J1282 Pneumonia due to coronavirus disease 2019: Secondary | ICD-10-CM | POA: Diagnosis not present

## 2020-04-18 DIAGNOSIS — Z91041 Radiographic dye allergy status: Secondary | ICD-10-CM

## 2020-04-18 DIAGNOSIS — R059 Cough, unspecified: Secondary | ICD-10-CM | POA: Diagnosis not present

## 2020-04-18 DIAGNOSIS — I152 Hypertension secondary to endocrine disorders: Secondary | ICD-10-CM | POA: Diagnosis present

## 2020-04-18 LAB — CBC WITH DIFFERENTIAL/PLATELET
Abs Immature Granulocytes: 0.03 10*3/uL (ref 0.00–0.07)
Basophils Absolute: 0 10*3/uL (ref 0.0–0.1)
Basophils Relative: 0 %
Eosinophils Absolute: 0 10*3/uL (ref 0.0–0.5)
Eosinophils Relative: 0 %
HCT: 39 % (ref 36.0–46.0)
Hemoglobin: 13.2 g/dL (ref 12.0–15.0)
Immature Granulocytes: 1 %
Lymphocytes Relative: 18 %
Lymphs Abs: 1.2 10*3/uL (ref 0.7–4.0)
MCH: 29.9 pg (ref 26.0–34.0)
MCHC: 33.8 g/dL (ref 30.0–36.0)
MCV: 88.4 fL (ref 80.0–100.0)
Monocytes Absolute: 0.4 10*3/uL (ref 0.1–1.0)
Monocytes Relative: 6 %
Neutro Abs: 4.9 10*3/uL (ref 1.7–7.7)
Neutrophils Relative %: 75 %
Platelets: 199 10*3/uL (ref 150–400)
RBC: 4.41 MIL/uL (ref 3.87–5.11)
RDW: 14 % (ref 11.5–15.5)
WBC: 6.4 10*3/uL (ref 4.0–10.5)
nRBC: 0 % (ref 0.0–0.2)

## 2020-04-18 LAB — COMPREHENSIVE METABOLIC PANEL
ALT: 69 U/L — ABNORMAL HIGH (ref 0–44)
AST: 75 U/L — ABNORMAL HIGH (ref 15–41)
Albumin: 3.7 g/dL (ref 3.5–5.0)
Alkaline Phosphatase: 257 U/L — ABNORMAL HIGH (ref 38–126)
Anion gap: 12 (ref 5–15)
BUN: 13 mg/dL (ref 6–20)
CO2: 26 mmol/L (ref 22–32)
Calcium: 9.4 mg/dL (ref 8.9–10.3)
Chloride: 104 mmol/L (ref 98–111)
Creatinine, Ser: 0.63 mg/dL (ref 0.44–1.00)
GFR, Estimated: >60 mL/min (ref >60)
Glucose, Bld: 297 mg/dL — ABNORMAL HIGH (ref 70–99)
Potassium: 3.3 mmol/L — ABNORMAL LOW (ref 3.5–5.1)
Sodium: 142 mmol/L (ref 135–145)
Total Bilirubin: 0.9 mg/dL (ref 0.3–1.2)
Total Protein: 8.1 g/dL (ref 6.5–8.1)

## 2020-04-18 LAB — RESP PANEL BY RT PCR (RSV, FLU A&B, COVID)
Influenza A by PCR: NEGATIVE
Influenza B by PCR: NEGATIVE
Respiratory Syncytial Virus by PCR: NEGATIVE
SARS Coronavirus 2 by RT PCR: POSITIVE — AB

## 2020-04-18 LAB — GLUCOSE, CAPILLARY
Glucose-Capillary: 238 mg/dL — ABNORMAL HIGH (ref 70–99)
Glucose-Capillary: 278 mg/dL — ABNORMAL HIGH (ref 70–99)
Glucose-Capillary: 305 mg/dL — ABNORMAL HIGH (ref 70–99)
Glucose-Capillary: 279 mg/dL — ABNORMAL HIGH (ref 70–99)

## 2020-04-18 LAB — TRIGLYCERIDES: Triglycerides: 127 mg/dL (ref <150)

## 2020-04-18 LAB — FERRITIN: Ferritin: 791 ng/mL — ABNORMAL HIGH (ref 11–307)

## 2020-04-18 LAB — TROPONIN I (HIGH SENSITIVITY)
Troponin I (High Sensitivity): 14 ng/L (ref <18)
Troponin I (High Sensitivity): 12 ng/L (ref <18)

## 2020-04-18 LAB — MAGNESIUM: Magnesium: 2.3 mg/dL (ref 1.7–2.4)

## 2020-04-18 LAB — PROCALCITONIN: Procalcitonin: 0.21 ng/mL

## 2020-04-18 LAB — FIBRINOGEN: Fibrinogen: >750 mg/dL — ABNORMAL HIGH (ref 210–475)

## 2020-04-18 LAB — FIBRIN DERIVATIVES D-DIMER (ARMC ONLY): Fibrin derivatives D-dimer (ARMC): 858.67 ng/mL (FEU) — ABNORMAL HIGH (ref 0.00–499.00)

## 2020-04-18 LAB — LACTATE DEHYDROGENASE: LDH: 318 U/L — ABNORMAL HIGH (ref 98–192)

## 2020-04-18 LAB — BRAIN NATRIURETIC PEPTIDE: B Natriuretic Peptide: 85.5 pg/mL (ref 0.0–100.0)

## 2020-04-18 MED ORDER — SERTRALINE HCL 50 MG PO TABS
100.0000 mg | ORAL_TABLET | Freq: Every day | ORAL | Status: DC
  Administered 2020-04-19 – 2020-04-23 (×5): 100 mg via ORAL
  Filled 2020-04-18 (×5): qty 2

## 2020-04-18 MED ORDER — SODIUM CHLORIDE 0.9 % IV SOLN
100.0000 mg | Freq: Every day | INTRAVENOUS | Status: AC
  Administered 2020-04-19 – 2020-04-22 (×4): 100 mg via INTRAVENOUS
  Filled 2020-04-18 (×4): qty 20

## 2020-04-18 MED ORDER — ENOXAPARIN SODIUM 60 MG/0.6ML ~~LOC~~ SOLN
0.5000 mg/kg | SUBCUTANEOUS | Status: DC
  Administered 2020-04-18 – 2020-04-22 (×5): 52.5 mg via SUBCUTANEOUS
  Filled 2020-04-18 (×5): qty 0.6

## 2020-04-18 MED ORDER — ONDANSETRON HCL 4 MG/2ML IJ SOLN
4.0000 mg | Freq: Three times a day (TID) | INTRAMUSCULAR | Status: DC | PRN
  Administered 2020-04-19: 22:00:00 4 mg via INTRAVENOUS
  Filled 2020-04-18: qty 2

## 2020-04-18 MED ORDER — IPRATROPIUM BROMIDE HFA 17 MCG/ACT IN AERS
2.0000 | INHALATION_SPRAY | RESPIRATORY_TRACT | Status: DC
  Administered 2020-04-18 – 2020-04-23 (×28): 2 via RESPIRATORY_TRACT
  Filled 2020-04-18 (×4): qty 12.9

## 2020-04-18 MED ORDER — TRAMADOL HCL 50 MG PO TABS
50.0000 mg | ORAL_TABLET | Freq: Two times a day (BID) | ORAL | Status: DC | PRN
  Administered 2020-04-19: 50 mg via ORAL
  Filled 2020-04-18: qty 1

## 2020-04-18 MED ORDER — INSULIN ASPART 100 UNIT/ML ~~LOC~~ SOLN
0.0000 [IU] | Freq: Three times a day (TID) | SUBCUTANEOUS | Status: DC
  Administered 2020-04-18: 5 [IU] via SUBCUTANEOUS
  Administered 2020-04-18: 7 [IU] via SUBCUTANEOUS
  Administered 2020-04-18: 3 [IU] via SUBCUTANEOUS
  Administered 2020-04-19 (×3): 7 [IU] via SUBCUTANEOUS
  Administered 2020-04-20: 13:00:00 9 [IU] via SUBCUTANEOUS
  Administered 2020-04-20: 09:00:00 7 [IU] via SUBCUTANEOUS
  Filled 2020-04-18 (×8): qty 1

## 2020-04-18 MED ORDER — INSULIN DETEMIR 100 UNIT/ML ~~LOC~~ SOLN: 10.0000 [IU] | Freq: Every day | SUBCUTANEOUS | Status: DC

## 2020-04-18 MED ORDER — METHYLPREDNISOLONE SODIUM SUCC 125 MG IJ SOLR
60.0000 mg | Freq: Two times a day (BID) | INTRAMUSCULAR | Status: DC
  Administered 2020-04-19: 60 mg via INTRAVENOUS
  Filled 2020-04-18: qty 2

## 2020-04-18 MED ORDER — BENAZEPRIL HCL 10 MG PO TABS
10.0000 mg | ORAL_TABLET | Freq: Every day | ORAL | Status: DC
  Administered 2020-04-18 – 2020-04-23 (×6): 10 mg via ORAL
  Filled 2020-04-18 (×6): qty 1

## 2020-04-18 MED ORDER — DM-GUAIFENESIN ER 30-600 MG PO TB12
1.0000 | ORAL_TABLET | Freq: Two times a day (BID) | ORAL | Status: DC | PRN
  Administered 2020-04-18: 1 via ORAL
  Filled 2020-04-18: qty 1

## 2020-04-18 MED ORDER — INSULIN ASPART 100 UNIT/ML ~~LOC~~ SOLN
0.0000 [IU] | Freq: Every day | SUBCUTANEOUS | Status: DC
  Administered 2020-04-18: 3 [IU] via SUBCUTANEOUS
  Administered 2020-04-19: 22:00:00 4 [IU] via SUBCUTANEOUS
  Filled 2020-04-18 (×2): qty 1

## 2020-04-18 MED ORDER — ZINC SULFATE 220 (50 ZN) MG PO CAPS
220.0000 mg | ORAL_CAPSULE | Freq: Every day | ORAL | Status: DC
  Administered 2020-04-18 – 2020-04-23 (×6): 220 mg via ORAL
  Filled 2020-04-18 (×6): qty 1

## 2020-04-18 MED ORDER — ACETAMINOPHEN 325 MG PO TABS: 650.0000 mg | ORAL_TABLET | Freq: Four times a day (QID) | ORAL | Status: DC | PRN

## 2020-04-18 MED ORDER — SODIUM CHLORIDE 0.9 % IV SOLN
200.0000 mg | Freq: Once | INTRAVENOUS | Status: AC
  Administered 2020-04-18: 200 mg via INTRAVENOUS
  Filled 2020-04-18: qty 40

## 2020-04-18 MED ORDER — METHYLPREDNISOLONE SODIUM SUCC 125 MG IJ SOLR
125.0000 mg | Freq: Once | INTRAMUSCULAR | Status: AC
  Administered 2020-04-18: 125 mg via INTRAVENOUS
  Filled 2020-04-18: qty 2

## 2020-04-18 MED ORDER — ALBUTEROL SULFATE HFA 108 (90 BASE) MCG/ACT IN AERS
2.0000 | INHALATION_SPRAY | RESPIRATORY_TRACT | Status: DC | PRN
  Filled 2020-04-18: qty 6.7

## 2020-04-18 MED ORDER — ASCORBIC ACID 500 MG PO TABS
500.0000 mg | ORAL_TABLET | Freq: Every day | ORAL | Status: DC
  Administered 2020-04-18 – 2020-04-23 (×6): 500 mg via ORAL
  Filled 2020-04-18 (×6): qty 1

## 2020-04-18 MED ORDER — LOPERAMIDE HCL 2 MG PO CAPS: 2.0000 mg | ORAL_CAPSULE | Freq: Two times a day (BID) | ORAL | Status: DC | PRN

## 2020-04-18 MED ORDER — INSULIN DETEMIR 100 UNIT/ML ~~LOC~~ SOLN
10.0000 [IU] | Freq: Two times a day (BID) | SUBCUTANEOUS | Status: DC
  Administered 2020-04-18 – 2020-04-20 (×5): 10 [IU] via SUBCUTANEOUS
  Filled 2020-04-18 (×7): qty 0.1

## 2020-04-18 MED ORDER — HYDRALAZINE HCL 20 MG/ML IJ SOLN: 5.0000 mg | INTRAMUSCULAR | Status: DC | PRN

## 2020-04-18 MED ORDER — PANTOPRAZOLE SODIUM 40 MG PO TBEC
40.0000 mg | DELAYED_RELEASE_TABLET | Freq: Every day | ORAL | Status: DC
  Administered 2020-04-19 – 2020-04-23 (×5): 40 mg via ORAL
  Filled 2020-04-18 (×5): qty 1

## 2020-04-18 MED ORDER — POTASSIUM CHLORIDE CRYS ER 20 MEQ PO TBCR
40.0000 meq | EXTENDED_RELEASE_TABLET | Freq: Once | ORAL | Status: AC
  Administered 2020-04-18: 10:00:00 40 meq via ORAL
  Filled 2020-04-18: qty 2

## 2020-04-18 MED ORDER — IOHEXOL 350 MG/ML SOLN
100.0000 mL | Freq: Once | INTRAVENOUS | Status: AC | PRN
  Administered 2020-04-18: 100 mL via INTRAVENOUS

## 2020-04-18 MED ORDER — ALPRAZOLAM 0.5 MG PO TABS
0.5000 mg | ORAL_TABLET | Freq: Every day | ORAL | Status: DC | PRN
  Administered 2020-04-19 – 2020-04-20 (×2): 0.5 mg via ORAL
  Filled 2020-04-18 (×2): qty 1

## 2020-04-18 NOTE — ED Provider Notes (Signed)
[facility] Emergency Department Provider Note  ____________________________________________  Time seen: Approximately 4:23 AM  I have reviewed the triage vital signs and the nursing notes.   HISTORY  Chief Complaint Covid Positive and Shortness of Breath   HPI Kristen Sparks is a 59 y.o. female with a history of hypertension, hyperlipidemia, diabetes, GERD, and rheumatoid arthritis not on any medications who presents for evaluation of shortness of breath.  Patient reports that she started feeling unwell a week ago and took a home Covid test sent which was positive.  Patient reports progressively worsening shortness of breath.  Seen at [facility] 3 days ago for the same.  This evening the shortness of breath became even more severe.  Patient denies a history of asthma, COPD or smoking.  She denies chest pain.  She reports fever, body aches, cough productive of clear phlegm, and generalized weakness.  No nausea, vomiting, diarrhea.  No personal family history of blood clots, no recent travel immobilization, no leg pain or swelling, no hemoptysis or exogenous hormones.  Patient is unvaccinated.   Past Medical History:  Diagnosis Date  . Anxiety   . Asthma   . Depression   . Environmental allergies   . Hyperlipidemia   . Hypertension   . Rheumatoid arthritis ([place])   . Sleep apnea 2019   Borderline  . Type 2 diabetes mellitus [facility])     Patient Active Problem List   Diagnosis Date Noted  . Constipation 11/24/2019  . Gastroesophageal reflux disease with esophagitis without hemorrhage 11/24/2019  . Acute recurrent frontal sinusitis 10/07/2019  . Diabetes mellitus without complication ([place]) 45/80/9983  . Gastroenteritis 02/08/2019  . Nausea 02/08/2019  . Depression 11/07/2018  . Chest pain 09/26/2018  . Primary localized osteoarthrosis 09/26/2018  . Muscle pain 09/26/2018  . Flu-like symptoms 07/09/2018  . Acute upper respiratory infection 07/09/2018  . Cough  07/09/2018  . Wheezing 07/09/2018  . Type 2 diabetes mellitus with hyperglycemia ([place]) 08/02/2017  . Essential (primary) hypertension 08/02/2017  . Other obesity due to excess calories 08/02/2017  . Generalized anxiety disorder 08/02/2017    Past Surgical History:  Procedure Laterality Date  . CHOLECYSTECTOMY  2000  . colon polyectomy    . COLONOSCOPY WITH PROPOFOL N/A 03/31/2018   Procedure: COLONOSCOPY WITH PROPOFOL;  Surgeon: Jonathon Bellows, MD;  Location: [facility] ENDOSCOPY;  Service: Gastroenterology;  Laterality: N/A;  . FOOT SURGERY      Prior to Admission medications   Medication Sig Start Date End Date Taking? Authorizing Provider  ALPRAZolam Duanne Moron) 0.5 MG tablet Take 1/2 t o1 tablet po QD prn 11/24/19   Ronnell Freshwater, NP  benazepril (LOTENSIN) 20 MG tablet Take 0.5 tablets (10 mg total) by mouth daily. 12/22/19   Kendell Bane, NP  ciprofloxacin-dexamethasone (CIPRODEX) OTIC suspension Place 4 drops into the right ear 2 (two) times daily. Patient not taking: Reported on 03/18/2020 01/11/20   Kendell Bane, NP  nitrofurantoin, macrocrystal-monohydrate, (MACROBID) 100 MG capsule Take 1 capsule (100 mg total) by mouth 2 (two) times daily. 03/31/20   Kendell Bane, NP  omeprazole (PRILOSEC) 40 MG capsule Take 1 capsule (40 mg total) by mouth 2 (two) times daily. 03/18/20   Kendell Bane, NP  predniSONE (STERAPRED UNI-PAK 21 TAB) 10 MG (21) TBPK tablet Use as directed for 6 days 01/27/20   Kendell Bane, NP  sertraline (ZOLOFT) 100 MG tablet Take 1 tablet (100 mg total) by mouth daily. 03/18/20   [place],  Audie Clear, NP  traMADol (ULTRAM) 50 MG tablet TAKE 1 TABLET BY MOUTH TWICE DAILY AS NEEDED FOR SEVERE PAIN 04/02/20   Kendell Bane, NP  traMADol (ULTRAM) 50 MG tablet TAKE 1 TABLET BY MOUTH TWICE DAILY AS NEEDED FOR SEVERE PAIN 05/03/20   Kendell Bane, NP    Allergies Azithromycin and Contrast media [iodinated diagnostic agents]  Family History  Problem Relation Age of  Onset  . Diabetes Mother   . COPD Mother   . Uterine cancer Mother 64  . Cancer Sister 21       Bile Duct  . Breast cancer Neg Hx     Social History Social History   Tobacco Use  . Smoking status: Never Smoker  . Smokeless tobacco: Never Used  Vaping Use  . Vaping Use: Never used  Substance Use Topics  . Alcohol use: Yes    Comment: occ.  . Drug use: No    Review of Systems  Constitutional: + fever. Eyes: Negative for visual changes. ENT: Negative for sore throat. Neck: No neck pain  Cardiovascular: Negative for chest pain. Respiratory: + shortness of breath and cough Gastrointestinal: Negative for abdominal pain, vomiting or diarrhea. Genitourinary: Negative for dysuria. Musculoskeletal: Negative for back pain. Skin: Negative for rash. Neurological: Negative for headaches, weakness or numbness. Psych: No SI or HI  ____________________________________________   PHYSICAL EXAM:  VITAL SIGNS: ED Triage Vitals  Enc Vitals Group     BP 04/18/20 0316 (!) 209/96     Pulse Rate 04/18/20 0316 83     Resp 04/18/20 0316 17     Temp 04/18/20 0316 98.7 F (37.1 C)     Temp Source 04/18/20 0316 Oral     SpO2 04/18/20 0316 94 %     Weight 04/18/20 0313 230 lb (104.3 kg)     Height 04/18/20 0313 5\' 3"  (1.6 m)     Head Circumference --      Peak Flow --      Pain Score 04/18/20 0313 10     Pain Loc --      Pain Edu? --      Excl. in [place]? --     Constitutional: Alert and oriented. Mild increased WOB.  HEENT:      Head: Normocephalic and atraumatic.         Eyes: Conjunctivae are normal. Sclera is non-icteric.       Mouth/Throat: Mucous membranes are moist.       Neck: Supple with no signs of meningismus. Cardiovascular: Regular rate and rhythm. No murmurs, gallops, or rubs. 2+ symmetrical distal pulses are present in all extremities. No JVD. Respiratory: Increased work of breathing, tachypneic, no wheezing or crackles, satting in the low 90s on room  air. Gastrointestinal: Soft, non tender, and non distended. Musculoskeletal: No edema, cyanosis, or erythema of extremities. Neurologic: Normal speech and language. Face is symmetric. Moving all extremities. No gross focal neurologic deficits are appreciated. Skin: Skin is warm, dry and intact. No rash noted. Psychiatric: Mood and affect are normal. Speech and behavior are normal.  ____________________________________________   LABS (all labs ordered are listed, but only abnormal results are displayed)  Labs Reviewed  RESP PANEL BY RT PCR (RSV, FLU A&B, COVID) - Abnormal; Notable for the following components:      Result Value   SARS Coronavirus 2 by RT PCR POSITIVE (*)    All other components within normal limits  COMPREHENSIVE METABOLIC PANEL - Abnormal; Notable for the following  components:   Potassium 3.3 (*)    Glucose, Bld 297 (*)    AST 75 (*)    ALT 69 (*)    Alkaline Phosphatase 257 (*)    All other components within normal limits  FIBRIN DERIVATIVES D-DIMER ([facility] ONLY) - Abnormal; Notable for the following components:   Fibrin derivatives D-dimer ([facility]) 858.67 (*)    All other components within normal limits  CBC WITH DIFFERENTIAL/PLATELET  PROCALCITONIN  TROPONIN I (HIGH SENSITIVITY)  TROPONIN I (HIGH SENSITIVITY)   ____________________________________________  EKG  ED ECG REPORT I, Rudene Re, the attending physician, personally viewed and interpreted this ECG.  NSR, rate 84, normal intervals, normal axis, no STE or depression.  No significant changes from 11/2019 ____________________________________________  RADIOLOGY  I have personally reviewed the images performed during this visit and I agree with the Radiologist's read.   Interpretation by Radiologist:  CT Angio Chest PE W and/or Wo Contrast  Result Date: 04/18/2020 CLINICAL DATA:  Worsening shortness of breath.  PE suspected. EXAM: CT ANGIOGRAPHY CHEST WITH CONTRAST TECHNIQUE: Multidetector  CT imaging of the chest was performed using the standard protocol during bolus administration of intravenous contrast. Multiplanar CT image reconstructions and MIPs were obtained to evaluate the vascular anatomy. CONTRAST:  123mL OMNIPAQUE IOHEXOL 350 MG/ML SOLN COMPARISON:  None. FINDINGS: Cardiovascular: The heart size is normal. No substantial pericardial effusion. No thoracic aortic aneurysm. Assessment of pulmonary arteries markedly degraded by breathing motion during image acquisition. There is no large central pulmonary embolus. Segmental and subsegmental pulmonary arteries show no definite filling defects although assessment in the lower lobes likely unreliable due to the substantial motion artifact. Mediastinum/Nodes: No mediastinal lymphadenopathy. There is no hilar lymphadenopathy. The esophagus has normal imaging features. There is no axillary lymphadenopathy. Lungs/Pleura: Bilateral patchy ill-defined ground-glass opacities are noted in all lobes of both lungs. No pneumothorax or pleural effusion. Upper Abdomen: Visualized portion of the adrenal glands shows thickening without definite nodule or mass. Musculoskeletal: No worrisome lytic or sclerotic osseous abnormality. Review of the MIP images confirms the above findings. IMPRESSION: 1. Markedly motion degraded study. No large central pulmonary embolus. Segmental and subsegmental pulmonary arteries show no definite filling defects although assessment in the lower lobes likely unreliable due to the substantial motion artifact. 2. Bilateral patchy ill-defined ground-glass opacities in all lobes of both lungs. Imaging features compatible with multifocal pneumonia in this patient with known COVID infection. Electronically Signed   By: Misty Stanley M.D.   On: 04/18/2020 06:28   DG Chest Portable 1 View  Result Date: 04/18/2020 CLINICAL DATA:  Shortness of breath. COVID positive 1 week ago. Productive cough. EXAM: PORTABLE CHEST 1 VIEW COMPARISON:   Radiograph 04/16/2020 FINDINGS: Progression in heterogeneous bilateral lung opacities typical of COVID pneumonia. Upper normal heart size with unchanged mediastinal contours. No evidence of pneumomediastinum. No visualized pneumothorax. No large pleural effusion. No acute osseous abnormalities are seen. IMPRESSION: Progression in heterogeneous bilateral lung opacities over the past 2 days typical of COVID pneumonia. Electronically Signed   By: Keith Rake M.D.   On: 04/18/2020 03:53     ____________________________________________   PROCEDURES  Procedure(s) performed:yes .1-3 Lead EKG Interpretation Performed by: Rudene Re, MD Authorized by: Rudene Re, MD     Interpretation: non-specific     ECG rate assessment: normal     Rhythm: sinus rhythm     Ectopy: none     Critical Care performed: yes  CRITICAL CARE Performed by: Rudene Re  ?  Total critical  care time: 35 min  Critical care time was exclusive of separately billable procedures and treating other patients.  Critical care was necessary to treat or prevent imminent or life-threatening deterioration.  Critical care was time spent personally by me on the following activities: development of treatment plan with patient and/or surrogate as well as nursing, discussions with consultants, evaluation of patient's response to treatment, examination of patient, obtaining history from patient or surrogate, ordering and performing treatments and interventions, ordering and review of laboratory studies, ordering and review of radiographic studies, pulse oximetry and re-evaluation of patient's condition.  ____________________________________________   INITIAL IMPRESSION / ASSESSMENT AND PLAN / ED COURSE   59 y.o. female with a history of hypertension, hyperlipidemia, diabetes, GERD, and rheumatoid arthritis not on any medications who presents for evaluation of worsening shortness of breath x 1 week since  having a positive home Covid test.  Patient is tachypneic with mild increased work of breathing but satting in the low 90s on room air, put on 2 L for comfort, no crackles or wheezing.  No history of asthma or COPD.  EKG unchanged from baseline when compared to prior.  Chest x-ray visualized by me showing worsening bilateral atypical pneumonia, confirmed by radiology.  Differential diagnosis including Covid versus overlying bacterial pneumonia versus PE versus pulmonary edema.  D-dimer pending.  We will do a PCR Covid test pending we will check procalcitonin.  CBC with no leukocytosis, no anemia, no thrombocytosis.  Chemistry panel with mildly elevated liver enzymes which is commonly seen on Covid patients.  Patient placed on telemetry for close monitoring.  Old medical records reviewed including recent visit to [facility] for similar symptoms 3 days ago.  _________________________ 6:46 AM on 04/18/2020 -----------------------------------------  Patient is desatting with just movement on the bed to mid-80s with increased work of breathing. Will transition to HFNC. CTA motion degraded with no large PE and consistent with diffuse ground glass opacities. Will start remdesevir and solumedrol. procal 0.21 will hold off abx. Will discuss with the hospitalist for admission.     _____________________________________________ Please note:  Patient was evaluated in Emergency Department today for the symptoms described in the history of present illness. Patient was evaluated in the context of the global COVID-19 pandemic, which necessitated consideration that the patient might be at risk for infection with the SARS-CoV-2 virus that causes COVID-19. Institutional protocols and algorithms that pertain to the evaluation of patients at risk for COVID-19 are in a state of rapid change based on information released by regulatory bodies including the CDC and federal and state organizations. These policies and algorithms were  followed during the patient's care in the ED.  Some ED evaluations and interventions may be delayed as a result of limited staffing during the pandemic.   [place] Controlled Substance Database was reviewed by me. ____________________________________________   FINAL CLINICAL IMPRESSION(S) / ED DIAGNOSES   Final diagnoses:  Acute respiratory failure with hypoxia ([place])  COVID-19      NEW MEDICATIONS STARTED DURING THIS VISIT:  ED Discharge Orders    None       Note:  This document was prepared using Dragon voice recognition software and may include unintentional dictation errors.    Rudene Re, MD 04/18/20 [phone]

## 2020-04-18 NOTE — Progress Notes (Signed)
PHARMACIST - PHYSICIAN COMMUNICATION  CONCERNING:  Enoxaparin (Lovenox) for DVT Prophylaxis    RECOMMENDATION: Patient was prescribed enoxaparin 40mg  q24 hours for VTE prophylaxis.   Filed Weights   04/18/20 0313  Weight: 104.3 kg (230 lb)    Body mass index is 40.74 kg/m.  Estimated Creatinine Clearance: 88.6 mL/min (by C-G formula based on SCr of 0.63 mg/dL).   Based on [place] patient is candidate for enoxaparin 0.5mg /kg TBW SQ every 24 hours based on BMI being >30.  DESCRIPTION: Pharmacy has adjusted enoxaparin dose per [facility] policy.  Patient is now receiving enoxaparin 52.5 mg every 24 hours    Benita Gutter 04/18/2020 4:24 PM

## 2020-04-18 NOTE — Progress Notes (Signed)
Remdesivir - Pharmacy Brief Note   O:  ALT: 69 CXR: bilateral Atypical PNA  SpO2: 91 % on 2L while standing   A/P:  Remdesivir 200 mg IVPB once followed by 100 mg IVPB daily x 4 days.   .me 04/18/2020 6:50 AM

## 2020-04-18 NOTE — ED Notes (Signed)
PT urinated on self. Assisted to clean up and sheets changed. PT desatted on 2L to 91% from standing, pt with tachypnea and labored breathing .

## 2020-04-18 NOTE — ED Notes (Signed)
Attempted IV x1 L AC no success

## 2020-04-18 NOTE — Progress Notes (Signed)
Inpatient Diabetes Program Recommendations  AACE/ADA: New Consensus Statement on Inpatient Glycemic Control (2015)  Target Ranges:  Prepandial:   less than 140 mg/dL      Peak postprandial:   less than 180 mg/dL (1-2 hours)      Critically ill patients:  140 - 180 mg/dL   Lab Results  Component Value Date   GLUCAP 278 (H) 04/18/2020   HGBA1C 6.9 (A) 03/31/2020    Review of Glycemic Control Results for Kristen Sparks, Kristen Sparks (MRN [id number]) as of 04/18/2020 12:08  Ref. Range 04/18/2020 08:42 04/18/2020 11:04  Glucose-Capillary Latest Ref Range: 70 - 99 mg/dL 238 (H) 278 (H)   Diabetes history: DM 2 Outpatient Diabetes medications: None listed Current orders for Inpatient glycemic control:  Novolog sensitive tid with meals and HS Solumedrol 60 mg IV q 12 hours  Inpatient Diabetes Program Recommendations:   Please consider adding Levemir 10 units bid while patient is on steroids.   Thanks  Adah Perl, RN, BC-ADM Inpatient Diabetes Coordinator Pager [phone] (8a-5p)

## 2020-04-18 NOTE — ED Triage Notes (Signed)
Pt to ED from home c/o SOB that has become worse last several days.  States COVID positive on the 18th, productive cough, weak, decreased appetite.  Pt with n/v/d, consistent cough.

## 2020-04-18 NOTE — H&P (Addendum)
History and Physical    Kristen Sparks RDE:[id number] DOB: 01/21/1961 DOA: 04/18/2020  Referring MD/NP/PA:   PCP: Lavera Guise, MD   Patient coming from:  The patient is coming from home.  At baseline, pt is independent for most of ADL.        Chief Complaint: SOB  HPI: Kristen Sparks is a 59 y.o. female with medical history significant of hypertension, hyperlipidemia, diabetes mellitus, asthma, GERD, depression, anxiety, rheumatoid arthritis, borderline OSA, obesity, who presents with cough, shortness breath.  Patient states that she has been feeling bad for more than 1 week. She has cough with clear mucus production, body aches, shortness of breath, generalized weakness, decreased appetite.  She had positive home Covid test on 10/18. Pt was seen at [facility] 3 days ago for the same.  Patient states that her shortness breath has been progressively worsening. She has mild chest pain on coughing. Pt has nausea, vomiting, diarrhea and abdominal discomfort.  She has had 3 times of watery diarrhea at home. No symptoms of UTI. She is not vaccinated against COVID-19.  Patient was found to have oxygen desaturation to mid 80s on room air, which improved to 94% on 2 L oxygen, but still has tachypnea with RR up to 39.  HFNC is started in ED.   ED Course: pt was found to have positive Covid PCR, potassium 3.3, renal function okay, liver function (ALP 257, AST 75, ALT 69, total bilirubin 0.9), temperature normal, blood pressure 209/96 --> 184/82, heart rate 89, RR 39, 29.  Chest x-ray showed bilateral infiltration.  CT angiogram has marked motion degradation, did not show large PE, but showed bilateral patchy multifocal infiltration.  Patient is admitted to [facility] bed as inpatient.  Review of Systems:   General: no fevers, chills, no body weight gain, has poor appetite, has fatigue HEENT: no blurry vision, hearing changes or sore throat Respiratory: has dyspnea, coughing, no wheezing CV: has chest  pain, no palpitations GI: has nausea, vomiting, abdominal discomfort, diarrhea, no constipation GU: no dysuria, burning on urination, increased urinary frequency, hematuria  Ext: no leg edema Neuro: no unilateral weakness, numbness, or tingling, no vision change or hearing loss Skin: no rash, no skin tear. MSK: No muscle spasm, no deformity, no limitation of range of movement in spin Heme: No easy bruising.  Travel history: No recent long distant travel.  Allergy:  Allergies  Allergen Reactions  . Azithromycin Hives  . Contrast Media [Iodinated Diagnostic Agents]     Past Medical History:  Diagnosis Date  . Anxiety   . Asthma   . Depression   . Environmental allergies   . Hyperlipidemia   . Hypertension   . Rheumatoid arthritis ([place])   . Sleep apnea 2019   Borderline  . Type 2 diabetes mellitus ([place])     Past Surgical History:  Procedure Laterality Date  . CHOLECYSTECTOMY  2000  . colon polyectomy    . COLONOSCOPY WITH PROPOFOL N/A 03/31/2018   Procedure: COLONOSCOPY WITH PROPOFOL;  Surgeon: Jonathon Bellows, MD;  Location: [facility] ENDOSCOPY;  Service: Gastroenterology;  Laterality: N/A;  . FOOT SURGERY      Social History:  reports that she has never smoked. She has never used smokeless tobacco. She reports current alcohol use. She reports that she does not use drugs.  Family History:  Family History  Problem Relation Age of Onset  . Diabetes Mother   . COPD Mother   . Uterine cancer Mother 62  .  Cancer Sister 79       Bile Duct  . Breast cancer Neg Hx      Prior to Admission medications   Medication Sig Start Date End Date Taking? Authorizing Provider  ALPRAZolam Duanne Moron) 0.5 MG tablet Take 1/2 t o1 tablet po QD prn 11/24/19   Ronnell Freshwater, NP  benazepril (LOTENSIN) 20 MG tablet Take 0.5 tablets (10 mg total) by mouth daily. 12/22/19   Kendell Bane, NP  ciprofloxacin-dexamethasone (CIPRODEX) OTIC suspension Place 4 drops into the right ear 2 (two) times  daily. Patient not taking: Reported on 03/18/2020 01/11/20   Kendell Bane, NP  nitrofurantoin, macrocrystal-monohydrate, (MACROBID) 100 MG capsule Take 1 capsule (100 mg total) by mouth 2 (two) times daily. 03/31/20   Kendell Bane, NP  omeprazole (PRILOSEC) 40 MG capsule Take 1 capsule (40 mg total) by mouth 2 (two) times daily. 03/18/20   Kendell Bane, NP  predniSONE (STERAPRED UNI-PAK 21 TAB) 10 MG (21) TBPK tablet Use as directed for 6 days 01/27/20   Kendell Bane, NP  sertraline (ZOLOFT) 100 MG tablet Take 1 tablet (100 mg total) by mouth daily. 03/18/20   Kendell Bane, NP  traMADol (ULTRAM) 50 MG tablet TAKE 1 TABLET BY MOUTH TWICE DAILY AS NEEDED FOR SEVERE PAIN 04/02/20   Kendell Bane, NP  traMADol (ULTRAM) 50 MG tablet TAKE 1 TABLET BY MOUTH TWICE DAILY AS NEEDED FOR SEVERE PAIN 05/03/20   Kendell Bane, NP    Physical Exam: Vitals:   04/18/20 0313 04/18/20 0316 04/18/20 0639 04/18/20 0646  BP:  (!) 209/96 (!) 184/82   Pulse:  83 84 89  Resp:  17 (!) 39 (!) 29  Temp:  98.7 F (37.1 C)    TempSrc:  Oral    SpO2:  94% 92% 94%  Weight: 104.3 kg     Height: 5\' 3"  (1.6 m)      General: Not in acute distress HEENT:       Eyes: PERRL, EOMI, no scleral icterus.       ENT: No discharge from the ears and nose, no pharynx injection, no tonsillar enlargement.        Neck: No JVD, no bruit, no mass felt. Heme: No neck lymph node enlargement. Cardiac: S1/S2, RRR, No murmurs, No gallops or rubs. Respiratory: has coarse breathing sound, increased work of breathing, tachypneic,  No rales, wheezing, rhonchi or rubs. GI: Soft, nondistended, nontender, no rebound pain, no organomegaly, BS present. GU: No hematuria Ext: No pitting leg edema bilaterally. 1+DP/PT pulse bilaterally. Musculoskeletal: No joint deformities, No joint redness or warmth, no limitation of ROM in spin. Skin: No rashes.  Neuro: Alert, oriented X3, cranial nerves II-XII grossly intact, moves all  extremities normally.  Psych: Patient is not psychotic, no suicidal or hemocidal ideation.  Labs on Admission: I have personally reviewed following labs and imaging studies  CBC: Recent Labs  Lab 04/18/20 0338  WBC 6.4  NEUTROABS 4.9  HGB 13.2  HCT 39.0  MCV 88.4  PLT 572   Basic Metabolic Panel: Recent Labs  Lab 04/18/20 0338 04/18/20 0717  NA 142  --   K 3.3*  --   CL 104  --   CO2 26  --   GLUCOSE 297*  --   BUN 13  --   CREATININE 0.63  --   CALCIUM 9.4  --   MG  --  2.3   GFR: Estimated Creatinine Clearance: 88.6 mL/min (  by C-G formula based on SCr of 0.63 mg/dL). Liver Function Tests: Recent Labs  Lab 04/18/20 0338  AST 75*  ALT 69*  ALKPHOS 257*  BILITOT 0.9  PROT 8.1  ALBUMIN 3.7   No results for input(s): LIPASE, AMYLASE in the last 168 hours. No results for input(s): AMMONIA in the last 168 hours. Coagulation Profile: No results for input(s): INR, PROTIME in the last 168 hours. Cardiac Enzymes: No results for input(s): CKTOTAL, CKMB, CKMBINDEX, TROPONINI in the last 168 hours. BNP (last 3 results) No results for input(s): PROBNP in the last 8760 hours. HbA1C: No results for input(s): HGBA1C in the last 72 hours. CBG: No results for input(s): GLUCAP in the last 168 hours. Lipid Profile: No results for input(s): CHOL, HDL, LDLCALC, TRIG, CHOLHDL, LDLDIRECT in the last 72 hours. Thyroid Function Tests: No results for input(s): TSH, T4TOTAL, FREET4, T3FREE, THYROIDAB in the last 72 hours. Anemia Panel: No results for input(s): VITAMINB12, FOLATE, FERRITIN, TIBC, IRON, RETICCTPCT in the last 72 hours. Urine analysis:    Component Value Date/Time   COLORURINE YELLOW (A) 02/04/2019 1534   APPEARANCEUR Clear 03/18/2020 0946   LABSPEC 1.023 02/04/2019 1534   PHURINE 5.0 02/04/2019 1534   GLUCOSEU Negative 03/18/2020 0946   HGBUR NEGATIVE 02/04/2019 1534   BILIRUBINUR Negative 03/18/2020 0946   KETONESUR NEGATIVE 02/04/2019 1534   PROTEINUR  Negative 03/18/2020 0946   PROTEINUR NEGATIVE 02/04/2019 1534   NITRITE Negative 03/18/2020 0946   NITRITE NEGATIVE 02/04/2019 1534   LEUKOCYTESUR Trace (A) 03/18/2020 0946   LEUKOCYTESUR NEGATIVE 02/04/2019 1534   Sepsis Labs: @LABRCNTIP (procalcitonin:4,lacticidven:4) ) Recent Results (from the past 240 hour(s))  Resp Panel by RT PCR (RSV, Flu A&B, Covid) - Nasopharyngeal Swab     Status: Abnormal   Collection Time: 04/18/20  3:38 AM   Specimen: Nasopharyngeal Swab  Result Value Ref Range Status   SARS Coronavirus 2 by RT PCR POSITIVE (A) NEGATIVE Final    Comment: RESULT CALLED TO, READ BACK BY AND VERIFIED WITH: PAIGE JOHNSON AT 0619 ON 04/18/2020 [place]. (NOTE) SARS-CoV-2 target nucleic acids are DETECTED.  SARS-CoV-2 RNA is generally detectable in upper respiratory specimens  during the acute phase of infection. Positive results are indicative of the presence of the identified virus, but do not rule out bacterial infection or co-infection with other pathogens not detected by the test. Clinical correlation with patient history and other diagnostic information is necessary to determine patient infection status. The expected result is Negative.  Fact Sheet for Patients:  PinkCheek.be  Fact Sheet for Healthcare Providers: GravelBags.it  This test is not yet approved or cleared by the [place] FDA and  has been authorized for detection and/or diagnosis of SARS-CoV-2 by FDA under an Emergency Use Authorization (EUA).  This EUA will remain in effect (meaning this test c an be used) for the duration of  the COVID-19 declaration under Section 564(b)(1) of the Act, 21 U.S.C. section 360bbb-3(b)(1), unless the authorization is terminated or revoked sooner.      Influenza A by PCR NEGATIVE NEGATIVE Final   Influenza B by PCR NEGATIVE NEGATIVE Final    Comment: (NOTE) The Xpert Xpress SARS-CoV-2/FLU/RSV assay is  intended as an aid in  the diagnosis of influenza from Nasopharyngeal swab specimens and  should not be used as a sole basis for treatment. Nasal washings and  aspirates are unacceptable for Xpert Xpress SARS-CoV-2/FLU/RSV  testing.  Fact Sheet for Patients: PinkCheek.be  Fact Sheet for Healthcare Providers: GravelBags.it  This test  is not yet approved or cleared by the [place] and  has been authorized for detection and/or diagnosis of SARS-CoV-2 by  FDA under an Emergency Use Authorization (EUA). This EUA will remain  in effect (meaning this test can be used) for the duration of the  Covid-19 declaration under Section 564(b)(1) of the Act, 21  U.S.C. section 360bbb-3(b)(1), unless the authorization is  terminated or revoked.    Respiratory Syncytial Virus by PCR NEGATIVE NEGATIVE Final    Comment: (NOTE) Fact Sheet for Patients: PinkCheek.be  Fact Sheet for Healthcare Providers: GravelBags.it  This test is not yet approved or cleared by the [place] FDA and  has been authorized for detection and/or diagnosis of SARS-CoV-2 by  FDA under an Emergency Use Authorization (EUA). This EUA will remain  in effect (meaning this test can be used) for the duration of the  COVID-19 declaration under Section 564(b)(1) of the Act, 21 U.S.C.  section 360bbb-3(b)(1), unless the authorization is terminated or  revoked. Performed at [facility], [address], [place], [place] [address]      Radiological Exams on Admission: CT Angio Chest PE W and/or Wo Contrast  Result Date: 04/18/2020 CLINICAL DATA:  Worsening shortness of breath.  PE suspected. EXAM: CT ANGIOGRAPHY CHEST WITH CONTRAST TECHNIQUE: Multidetector CT imaging of the chest was performed using the standard protocol during bolus administration of intravenous contrast. Multiplanar CT image  reconstructions and MIPs were obtained to evaluate the vascular anatomy. CONTRAST:  154mL OMNIPAQUE IOHEXOL 350 MG/ML SOLN COMPARISON:  None. FINDINGS: Cardiovascular: The heart size is normal. No substantial pericardial effusion. No thoracic aortic aneurysm. Assessment of pulmonary arteries markedly degraded by breathing motion during image acquisition. There is no large central pulmonary embolus. Segmental and subsegmental pulmonary arteries show no definite filling defects although assessment in the lower lobes likely unreliable due to the substantial motion artifact. Mediastinum/Nodes: No mediastinal lymphadenopathy. There is no hilar lymphadenopathy. The esophagus has normal imaging features. There is no axillary lymphadenopathy. Lungs/Pleura: Bilateral patchy ill-defined ground-glass opacities are noted in all lobes of both lungs. No pneumothorax or pleural effusion. Upper Abdomen: Visualized portion of the adrenal glands shows thickening without definite nodule or mass. Musculoskeletal: No worrisome lytic or sclerotic osseous abnormality. Review of the MIP images confirms the above findings. IMPRESSION: 1. Markedly motion degraded study. No large central pulmonary embolus. Segmental and subsegmental pulmonary arteries show no definite filling defects although assessment in the lower lobes likely unreliable due to the substantial motion artifact. 2. Bilateral patchy ill-defined ground-glass opacities in all lobes of both lungs. Imaging features compatible with multifocal pneumonia in this patient with known COVID infection. Electronically Signed   By: Misty Stanley M.D.   On: 04/18/2020 06:28   DG Chest Portable 1 View  Result Date: 04/18/2020 CLINICAL DATA:  Shortness of breath. COVID positive 1 week ago. Productive cough. EXAM: PORTABLE CHEST 1 VIEW COMPARISON:  Radiograph 04/16/2020 FINDINGS: Progression in heterogeneous bilateral lung opacities typical of COVID pneumonia. Upper normal heart size with  unchanged mediastinal contours. No evidence of pneumomediastinum. No visualized pneumothorax. No large pleural effusion. No acute osseous abnormalities are seen. IMPRESSION: Progression in heterogeneous bilateral lung opacities over the past 2 days typical of COVID pneumonia. Electronically Signed   By: Keith Rake M.D.   On: 04/18/2020 03:53     EKG: I have personally reviewed.  Sinus rhythm, QTC 460, LAE, nonspecific T wave change  Assessment/Plan Principal Problem:   Pneumonia due to COVID-19 virus Active  Problems:   Essential (primary) hypertension   Depression with anxiety   Diabetes mellitus without complication (HCC)   Gastroesophageal reflux disease with esophagitis without hemorrhage   Asthma   Hypokalemia   Acute respiratory failure with hypoxia (HCC)   Acute respiratory failure with hypoxia due to pneumonia due to COVID-19 virus:  -will admit to med-surg bed as inpt -Remdesivir per pharm -Solumedrol 60 mg bid -vitamin C, zinc.  -Bronchodilators -PRN Mucinex for cough -D-dimer, BNP,Trop, LFT, CRP, LDH, Procalcitonin, Ferritin, fibinogen, TG, Hep B SAg, HIV ab -Daily CRP, Ferritin, D-dimer, -Will ask the patient to maintain an awake prone position for 16+ hours a day, if possible, with a minimum of 2-3 hours at a time -Will attempt to maintain euvolemia to a net negative fluid status -IF patient deteriorates, will consult PCCM and ID  Essential (primary) hypertension: -IV hydralazine as needed -Continue home Lotensin  Depression with anxiety -Zoloft and Xanax as needed  Gastroesophageal reflux disease with esophagitis without hemorrhage -Protonix  Asthma  -Bronchodilators as above  Diabetes mellitus without complication: Recent V7S 6.9, well controlled.  Patient is not taking medications at home. Blood sugar 297 on BMP -Sliding scale insulin -will add Levemir 10 units bid since pt will be on high dose of solumdrol as per diabetic coordinator's  recommendation  Hypokalemia: K=3.3 on admission. - Repleted - Check Mg level   DVT ppx: SQ Lovenox Code Status: Full code Family Communication:  Yes, patient's husband by phone Disposition Plan:  Anticipate discharge back to previous environment Consults called:  none Admission status: Med-surg bed as inpt      Status is: Inpatient  Remains inpatient appropriate because:Inpatient level of care appropriate due to severity of illness.  Patient has multiple comorbidities, now presents with acute respiratory failure due to pneumonia due to COVID-19 virus infection.  Patient has oxygen desaturation to mid 80s on room air, currently on HFNC.  Her presentation is highly complicated.  Patient is at high risk of deteriorating.  Need to be treated in hospital for at least 2 days.   Dispo: The patient is from: Home              Anticipated d/c is to: Home              Anticipated d/c date is: 2 days              Patient currently is not medically stable to d/c.          Date of Service 04/18/2020    Ivor Costa [facility]   If 7PM-7AM, please contact night-coverage [email or web address] 04/18/2020, 7:42 AM

## 2020-04-19 ENCOUNTER — Inpatient Hospital Stay: Payer: BC Managed Care – PPO

## 2020-04-19 DIAGNOSIS — J1282 Pneumonia due to coronavirus disease 2019: Secondary | ICD-10-CM

## 2020-04-19 DIAGNOSIS — U071 COVID-19: Principal | ICD-10-CM

## 2020-04-19 LAB — COMPREHENSIVE METABOLIC PANEL
ALT: 66 U/L — ABNORMAL HIGH (ref 0–44)
AST: 56 U/L — ABNORMAL HIGH (ref 15–41)
Albumin: 3.4 g/dL — ABNORMAL LOW (ref 3.5–5.0)
Alkaline Phosphatase: 216 U/L — ABNORMAL HIGH (ref 38–126)
Anion gap: 12 (ref 5–15)
BUN: 23 mg/dL — ABNORMAL HIGH (ref 6–20)
CO2: 26 mmol/L (ref 22–32)
Calcium: 9.2 mg/dL (ref 8.9–10.3)
Chloride: 99 mmol/L (ref 98–111)
Creatinine, Ser: 0.55 mg/dL (ref 0.44–1.00)
GFR, Estimated: >60 mL/min (ref >60)
Glucose, Bld: 340 mg/dL — ABNORMAL HIGH (ref 70–99)
Potassium: 3.5 mmol/L (ref 3.5–5.1)
Sodium: 137 mmol/L (ref 135–145)
Total Bilirubin: 0.9 mg/dL (ref 0.3–1.2)
Total Protein: 7.7 g/dL (ref 6.5–8.1)

## 2020-04-19 LAB — HIV ANTIBODY (ROUTINE TESTING W REFLEX): HIV Screen 4th Generation wRfx: NONREACTIVE

## 2020-04-19 LAB — CBC WITH DIFFERENTIAL/PLATELET
Abs Immature Granulocytes: 0.02 10*3/uL (ref 0.00–0.07)
Basophils Absolute: 0 10*3/uL (ref 0.0–0.1)
Basophils Relative: 0 %
Eosinophils Absolute: 0 10*3/uL (ref 0.0–0.5)
Eosinophils Relative: 0 %
HCT: 38 % (ref 36.0–46.0)
Hemoglobin: 12.5 g/dL (ref 12.0–15.0)
Immature Granulocytes: 1 %
Lymphocytes Relative: 28 %
Lymphs Abs: 1.2 10*3/uL (ref 0.7–4.0)
MCH: 29.6 pg (ref 26.0–34.0)
MCHC: 32.9 g/dL (ref 30.0–36.0)
MCV: 89.8 fL (ref 80.0–100.0)
Monocytes Absolute: 0.3 10*3/uL (ref 0.1–1.0)
Monocytes Relative: 7 %
Neutro Abs: 2.7 10*3/uL (ref 1.7–7.7)
Neutrophils Relative %: 64 %
Platelets: 258 10*3/uL (ref 150–400)
RBC: 4.23 MIL/uL (ref 3.87–5.11)
RDW: 14 % (ref 11.5–15.5)
Smear Review: NORMAL
WBC: 4.2 10*3/uL (ref 4.0–10.5)
nRBC: 0 % (ref 0.0–0.2)

## 2020-04-19 LAB — C-REACTIVE PROTEIN
CRP: 14.7 mg/dL — ABNORMAL HIGH (ref <1.0)
CRP: 8.1 mg/dL — ABNORMAL HIGH (ref <1.0)

## 2020-04-19 LAB — GLUCOSE, CAPILLARY
Glucose-Capillary: 322 mg/dL — ABNORMAL HIGH (ref 70–99)
Glucose-Capillary: 323 mg/dL — ABNORMAL HIGH (ref 70–99)
Glucose-Capillary: 310 mg/dL — ABNORMAL HIGH (ref 70–99)
Glucose-Capillary: 336 mg/dL — ABNORMAL HIGH (ref 70–99)

## 2020-04-19 LAB — HEPATITIS B SURFACE ANTIGEN: Hepatitis B Surface Ag: NONREACTIVE

## 2020-04-19 LAB — FIBRIN DERIVATIVES D-DIMER (ARMC ONLY): Fibrin derivatives D-dimer (ARMC): 799.26 ng/mL (FEU) — ABNORMAL HIGH (ref 0.00–499.00)

## 2020-04-19 LAB — FERRITIN: Ferritin: 749 ng/mL — ABNORMAL HIGH (ref 11–307)

## 2020-04-19 MED ORDER — BENZONATATE 100 MG PO CAPS
100.0000 mg | ORAL_CAPSULE | Freq: Three times a day (TID) | ORAL | Status: DC
  Administered 2020-04-19 – 2020-04-23 (×13): 100 mg via ORAL
  Filled 2020-04-19 (×13): qty 1

## 2020-04-19 MED ORDER — INSULIN ASPART 100 UNIT/ML ~~LOC~~ SOLN
5.0000 [IU] | Freq: Three times a day (TID) | SUBCUTANEOUS | Status: DC
  Administered 2020-04-19 – 2020-04-23 (×12): 5 [IU] via SUBCUTANEOUS
  Filled 2020-04-19 (×11): qty 1

## 2020-04-19 MED ORDER — TRAMADOL HCL 50 MG PO TABS
50.0000 mg | ORAL_TABLET | Freq: Four times a day (QID) | ORAL | Status: DC | PRN
  Administered 2020-04-19 – 2020-04-20 (×3): 50 mg via ORAL
  Filled 2020-04-19 (×3): qty 1

## 2020-04-19 MED ORDER — HYDROCOD POLST-CPM POLST ER 10-8 MG/5ML PO SUER
5.0000 mL | Freq: Two times a day (BID) | ORAL | Status: DC | PRN
  Administered 2020-04-19 – 2020-04-22 (×4): 5 mL via ORAL
  Filled 2020-04-19 (×4): qty 5

## 2020-04-19 MED ORDER — DM-GUAIFENESIN ER 30-600 MG PO TB12
1.0000 | ORAL_TABLET | Freq: Two times a day (BID) | ORAL | Status: DC
  Administered 2020-04-19 – 2020-04-23 (×9): 1 via ORAL
  Filled 2020-04-19 (×9): qty 1

## 2020-04-19 MED ORDER — ACETAMINOPHEN 325 MG PO TABS: 650.0000 mg | ORAL_TABLET | Freq: Four times a day (QID) | ORAL | Status: DC | PRN

## 2020-04-19 MED ORDER — METHYLPREDNISOLONE SODIUM SUCC 125 MG IJ SOLR
1.0000 mg/kg | Freq: Two times a day (BID) | INTRAMUSCULAR | Status: DC
  Administered 2020-04-19 – 2020-04-20 (×3): 104.375 mg via INTRAVENOUS
  Filled 2020-04-19 (×3): qty 2

## 2020-04-19 NOTE — Progress Notes (Signed)
Pt AAox4, resting in bed asleep majority of shift. VS stable BP elevated at times especially when in pain On 2L [place] spo2 93% and above. Pt c/o of new abd pain today, MD made aware. Given Tramadol with minimal effect. Pt with poor appetite, eating 0-25% of meals. Received Remdesivir today. Will continue to monitor.

## 2020-04-19 NOTE — Progress Notes (Signed)
Inpatient Diabetes Program Recommendations  AACE/ADA: New Consensus Statement on Inpatient Glycemic Control (2015)  Target Ranges:  Prepandial:   less than 140 mg/dL      Peak postprandial:   less than 180 mg/dL (1-2 hours)      Critically ill patients:  140 - 180 mg/dL   Lab Results  Component Value Date   GLUCAP 322 (H) 04/19/2020   HGBA1C 6.9 (A) 03/31/2020    Review of Glycemic Control Results for Kristen Sparks, Kristen Sparks (MRN [id number]) as of 04/19/2020 09:40  Ref. Range 04/18/2020 11:04 04/18/2020 17:09 04/18/2020 21:22 04/19/2020 08:24  Glucose-Capillary Latest Ref Range: 70 - 99 mg/dL 278 (H) 305 (H) 279 (H) 322 (H)  Diabetes history: DM 2 Outpatient Diabetes medications: None listed Current orders for Inpatient glycemic control:  Novolog sensitive tid with meals and HS Solumedrol 60 mg IV q 12 hours Levemir 10 units bid  Inpatient Diabetes Program Recommendations:    Please add Novolog 5 units tid with meals (hold if patient eats less than 50% or NPO).   Thanks  Adah Perl, RN, BC-ADM Inpatient Diabetes Coordinator Pager [phone] (8a-5p)

## 2020-04-19 NOTE — Progress Notes (Signed)
[facility] Progress Note  Patient: Kristen Sparks    ATF:[id number]  DOA: 04/18/2020     Date of Service: the patient was seen and examined on 04/19/2020  Brief hospital course: Kristen Sparks is a 59 y.o. female with medical history significant of hypertension, hyperlipidemia, diabetes mellitus, asthma, GERD, depression, anxiety, rheumatoid arthritis, borderline OSA, obesity, who presents with cough, shortness breath. Currently plan is continue treatment for COVID-19 pneumonia and supportive care for hypoxia.  Assessment and Plan: 1. Acute COVID-19 Viral Pneumonia CXR: hazy bilateral peripheral opacities CT chest: GGO, consolidation negative for PE. Oxygen requirement: 80s on room air.  Currently on 2 LPM.  With severe tachypnea.  Occasionally dropping down to 89% as well. CRP: 14.7-8.1 Remdesivir: Started on 04/17/2020. Steroids: Started on 04/17/2020. Baricitinib/Actemra(off-label use): Not a candidate given history of diverticulitis in 2016. The investigational nature of this medication was discussed with the patient/HCPOA and they choose to proceed as the potential benefits are felt to outweigh risks at this time.  Antibiotics: No indication for now. Vitamin C and Zinc: Continue Not vaccinated. Prone positioning and incentive spirometer use recommended.  The treatment plan and use of medications and known side effects were discussed with patient/family. It was clearly explained that Complete risks and long-term side effects are unknown, however in the best clinical judgment they seem to be of some clinical benefit rather than medical risks. Patient/family agree with the treatment plan and want to receive these treatments as indicated.   2.  Abdominal pain. Likely from coughing. X-ray abdomen unremarkable.  CT chest also negative for any acute abnormality in the upper abdomen area.  3.  Mild hypokalemia Currently resolved.  4.  Elevated LFT Improving.  In the setting of  COVID-19 pneumonia.  Monitor while the patient is on remdesivir.  5.  Essential hypertension with accelerated hypertension Currently on Lotensin which is continued.  Monitor.  6.  GERD. Continue PPI.  7.  Type 2 diabetes mellitus uncontrolled with hyperglycemia. Hemoglobin A1c elevated. Currently on sliding scale insulin. Blood sugars elevated secondary to being on steroids.  8.  Anxiety. Currently on Xanax. Which we will continue. Continue Zoloft.  9.  Morbid obesity. BMI elevated. Placing the patient at high risk for poor outcome from COVID-19 pneumonia. Body mass index is 40.74 kg/m.   Diet: Cardiac and carb modified diet DVT Prophylaxis: Subcutaneous Lovenox      Advance goals of care discussion: Full code  Family Communication: no family was present at bedside, at the time of interview.   Disposition:  Status is: Inpatient  Remains inpatient appropriate because:IV treatments appropriate due to intensity of illness or inability to take PO   Dispo: The patient is from: Home              Anticipated d/c is to: Home              Anticipated d/c date is: > 3 days              Patient currently is not medically stable to d/c.        Subjective: No nausea no vomiting.  Reports abdominal pain.  Reports severe cough as well.  No chest pain right now.  No headache no dizziness.  Physical Exam:  General: Appear in marked distress, no Rash; Oral Mucosa Clear, moist. no Abnormal Neck Mass Or lumps, Conjunctiva normal  Cardiovascular: S1 and S2 Present, no Murmur, Respiratory: increased respiratory effort, Bilateral Air entry present and bilateral  Crackles,  no wheezes Abdomen: Bowel Sound present, Soft and no tenderness Extremities: no Pedal edema Neurology: alert and oriented to time, place, and person affect appropriate. no new focal deficit Gait not checked due to patient safety concerns  Vitals:   04/19/20 1134 04/19/20 1448 04/19/20 1500 04/19/20 1656  BP:  (!) 162/106  (!) 149/65 (!) 160/82  Pulse: 93  78 83  Resp:      Temp: 98.7 F (37.1 C)  98.9 F (37.2 C) 98.1 F (36.7 C)  TempSrc: Oral  Oral Oral  SpO2: 94% 94% 96% (!) 88%  Weight:      Height:       No intake or output data in the 24 hours ending 04/19/20 1832 Filed Weights   04/18/20 0313  Weight: 104.3 kg    Data Reviewed: I have personally reviewed and interpreted daily labs, tele strips, imagings as discussed above. I reviewed all nursing notes, pharmacy notes, vitals, pertinent old records I have discussed plan of care as described above with RN and patient/family.  CBC: Recent Labs  Lab 04/18/20 0338 04/19/20 0706  WBC 6.4 4.2  NEUTROABS 4.9 2.7  HGB 13.2 12.5  HCT 39.0 38.0  MCV 88.4 89.8  PLT 199 182   Basic Metabolic Panel: Recent Labs  Lab 04/18/20 0338 04/18/20 0717 04/19/20 0706  NA 142  --  137  K 3.3*  --  3.5  CL 104  --  99  CO2 26  --  26  GLUCOSE 297*  --  340*  BUN 13  --  23*  CREATININE 0.63  --  0.55  CALCIUM 9.4  --  9.2  MG  --  2.3  --     Studies: DG Abd Portable 1V  Result Date: 04/19/2020 CLINICAL DATA:  Stomach ache EXAM: PORTABLE ABDOMEN - 1 VIEW COMPARISON:  Portable exam 1349 hours without priors for comparison FINDINGS: Normal bowel gas pattern. No bowel dilatation or bowel wall thickening. Surgical clips RIGHT upper quadrant question cholecystectomy. Osseous structures unremarkable. No urinary tract calcification. IMPRESSION: Normal exam. Electronically Signed   By: Lavonia Dana M.D.   On: 04/19/2020 15:23    Scheduled Meds: . vitamin C  500 mg Oral Daily  . benazepril  10 mg Oral Daily  . benzonatate  100 mg Oral TID  . dextromethorphan-guaiFENesin  1 tablet Oral BID  . enoxaparin (LOVENOX) injection  0.5 mg/kg Subcutaneous Q24H  . insulin aspart  0-5 Units Subcutaneous QHS  . insulin aspart  0-9 Units Subcutaneous TID WC  . insulin aspart  5 Units Subcutaneous TID WC  . insulin detemir  10 Units Subcutaneous  BID  . ipratropium  2 puff Inhalation Q4H  . methylPREDNISolone (SOLU-MEDROL) injection  1 mg/kg Intravenous Q12H  . pantoprazole  40 mg Oral Daily  . sertraline  100 mg Oral Daily  . zinc sulfate  220 mg Oral Daily   Continuous Infusions: . remdesivir 100 mg in NS 100 mL 100 mg (04/19/20 0936)   PRN Meds: acetaminophen, albuterol, ALPRAZolam, chlorpheniramine-HYDROcodone, hydrALAZINE, loperamide, ondansetron (ZOFRAN) IV, traMADol  Time spent: 35 minutes  Author: Berle Mull, MD [facility] 04/19/2020 6:32 PM  To reach On-call, see care teams to locate the attending and reach out via [email or web address]. Between 7PM-7AM, please contact night-coverage If you still have difficulty reaching the attending provider, please page the [facility] (Director on Call) for [facility] on amion for assistance.

## 2020-04-20 DIAGNOSIS — E1165 Type 2 diabetes mellitus with hyperglycemia: Secondary | ICD-10-CM

## 2020-04-20 DIAGNOSIS — J9601 Acute respiratory failure with hypoxia: Secondary | ICD-10-CM

## 2020-04-20 LAB — CBC WITH DIFFERENTIAL/PLATELET
Abs Immature Granulocytes: 0.07 10*3/uL (ref 0.00–0.07)
Basophils Absolute: 0 10*3/uL (ref 0.0–0.1)
Basophils Relative: 0 %
Eosinophils Absolute: 0 10*3/uL (ref 0.0–0.5)
Eosinophils Relative: 0 %
HCT: 40.1 % (ref 36.0–46.0)
Hemoglobin: 13.1 g/dL (ref 12.0–15.0)
Immature Granulocytes: 1 %
Lymphocytes Relative: 16 %
Lymphs Abs: 1.4 10*3/uL (ref 0.7–4.0)
MCH: 29.4 pg (ref 26.0–34.0)
MCHC: 32.7 g/dL (ref 30.0–36.0)
MCV: 89.9 fL (ref 80.0–100.0)
Monocytes Absolute: 0.3 10*3/uL (ref 0.1–1.0)
Monocytes Relative: 4 %
Neutro Abs: 6.5 10*3/uL (ref 1.7–7.7)
Neutrophils Relative %: 79 %
Platelets: 321 10*3/uL (ref 150–400)
RBC: 4.46 MIL/uL (ref 3.87–5.11)
RDW: 13.7 % (ref 11.5–15.5)
Smear Review: NORMAL
WBC: 8.2 10*3/uL (ref 4.0–10.5)
nRBC: 0 % (ref 0.0–0.2)

## 2020-04-20 LAB — COMPREHENSIVE METABOLIC PANEL
ALT: 66 U/L — ABNORMAL HIGH (ref 0–44)
AST: 50 U/L — ABNORMAL HIGH (ref 15–41)
Albumin: 3.4 g/dL — ABNORMAL LOW (ref 3.5–5.0)
Alkaline Phosphatase: 183 U/L — ABNORMAL HIGH (ref 38–126)
Anion gap: 13 (ref 5–15)
BUN: 28 mg/dL — ABNORMAL HIGH (ref 6–20)
CO2: 25 mmol/L (ref 22–32)
Calcium: 9.4 mg/dL (ref 8.9–10.3)
Chloride: 99 mmol/L (ref 98–111)
Creatinine, Ser: 0.47 mg/dL (ref 0.44–1.00)
GFR, Estimated: >60 mL/min (ref >60)
Glucose, Bld: 324 mg/dL — ABNORMAL HIGH (ref 70–99)
Potassium: 3.7 mmol/L (ref 3.5–5.1)
Sodium: 137 mmol/L (ref 135–145)
Total Bilirubin: 0.9 mg/dL (ref 0.3–1.2)
Total Protein: 7.3 g/dL (ref 6.5–8.1)

## 2020-04-20 LAB — GLUCOSE, CAPILLARY
Glucose-Capillary: 327 mg/dL — ABNORMAL HIGH (ref 70–99)
Glucose-Capillary: 390 mg/dL — ABNORMAL HIGH (ref 70–99)
Glucose-Capillary: 342 mg/dL — ABNORMAL HIGH (ref 70–99)
Glucose-Capillary: 312 mg/dL — ABNORMAL HIGH (ref 70–99)

## 2020-04-20 LAB — C-REACTIVE PROTEIN: CRP: 2.3 mg/dL — ABNORMAL HIGH (ref <1.0)

## 2020-04-20 LAB — FIBRIN DERIVATIVES D-DIMER (ARMC ONLY): Fibrin derivatives D-dimer (ARMC): 570.35 ng/mL (FEU) — ABNORMAL HIGH (ref 0.00–499.00)

## 2020-04-20 MED ORDER — INSULIN DETEMIR 100 UNIT/ML ~~LOC~~ SOLN
10.0000 [IU] | Freq: Once | SUBCUTANEOUS | Status: AC
  Administered 2020-04-20: 10 [IU] via SUBCUTANEOUS
  Filled 2020-04-20: qty 0.1

## 2020-04-20 MED ORDER — METHYLPREDNISOLONE SODIUM SUCC 125 MG IJ SOLR
60.0000 mg | Freq: Two times a day (BID) | INTRAMUSCULAR | Status: DC
  Administered 2020-04-21 – 2020-04-22 (×2): 60 mg via INTRAVENOUS
  Filled 2020-04-20 (×2): qty 2

## 2020-04-20 MED ORDER — KETOROLAC TROMETHAMINE 30 MG/ML IJ SOLN
15.0000 mg | Freq: Once | INTRAMUSCULAR | Status: AC
  Administered 2020-04-20: 15 mg via INTRAVENOUS
  Filled 2020-04-20: qty 1

## 2020-04-20 MED ORDER — INSULIN ASPART 100 UNIT/ML ~~LOC~~ SOLN
0.0000 [IU] | Freq: Every day | SUBCUTANEOUS | Status: DC
  Administered 2020-04-20: 4 [IU] via SUBCUTANEOUS
  Filled 2020-04-20: qty 1

## 2020-04-20 MED ORDER — INSULIN ASPART 100 UNIT/ML ~~LOC~~ SOLN
0.0000 [IU] | Freq: Three times a day (TID) | SUBCUTANEOUS | Status: DC
  Administered 2020-04-20: 17:00:00 20 [IU] via SUBCUTANEOUS
  Administered 2020-04-21: 7 [IU] via SUBCUTANEOUS
  Administered 2020-04-21: 17:00:00 4 [IU] via SUBCUTANEOUS
  Administered 2020-04-21: 3 [IU] via SUBCUTANEOUS
  Administered 2020-04-22: 09:00:00 15 [IU] via SUBCUTANEOUS
  Administered 2020-04-22: 11 [IU] via SUBCUTANEOUS
  Administered 2020-04-22: 15 [IU] via SUBCUTANEOUS
  Administered 2020-04-23: 12:00:00 11 [IU] via SUBCUTANEOUS
  Administered 2020-04-23: 09:00:00 7 [IU] via SUBCUTANEOUS
  Filled 2020-04-20 (×9): qty 1

## 2020-04-20 MED ORDER — INSULIN DETEMIR 100 UNIT/ML ~~LOC~~ SOLN
20.0000 [IU] | Freq: Two times a day (BID) | SUBCUTANEOUS | Status: DC
  Administered 2020-04-20 – 2020-04-23 (×6): 20 [IU] via SUBCUTANEOUS
  Filled 2020-04-20 (×7): qty 0.2

## 2020-04-20 MED ORDER — OXYCODONE HCL 5 MG PO TABS
5.0000 mg | ORAL_TABLET | Freq: Four times a day (QID) | ORAL | Status: DC | PRN
  Administered 2020-04-20 – 2020-04-23 (×2): 5 mg via ORAL
  Filled 2020-04-20 (×2): qty 1

## 2020-04-20 NOTE — Progress Notes (Signed)
Pt AAox4, is better today objectively and subjectively. Pt more interactive and eating more today. VS stable. On 2L [place] spo2 93% and above. Pt states abd pain is improved with changing of prn pain meds today.  Received Remdesivir today. Husband notified of POC. Will continue to monitor.

## 2020-04-20 NOTE — Progress Notes (Signed)
PROGRESS NOTE  Kristen Sparks AYT:[id number] DOB: 1961-06-04 DOA: 04/18/2020  PCP: Lavera Guise, MD  Brief History/Interval Summary: 59 y.o.femalewith medical history significant ofhypertension, hyperlipidemia, diabetes mellitus, asthma, GERD, depression, anxiety, rheumatoid arthritis, borderline OSA, obesity, who presented with cough, shortness breath.  Patient diagnosed with pneumonia due to COVID-19.  Hospitalized for further management.  Reason for Visit: Pneumonia due to COVID-19.  Consultants: None  Procedures: None  Antibiotics: Anti-infectives (From admission, onward)   Start     Dose/Rate Route Frequency Ordered Stop   04/19/20 1000  remdesivir 100 mg in sodium chloride 0.9 % 100 mL IVPB       "Followed by" Linked Group Details   100 mg 200 mL/hr over 30 Minutes Intravenous Daily 04/18/20 0649 04/23/20 0959   04/18/20 0700  remdesivir 200 mg in sodium chloride 0.9% 250 mL IVPB       "Followed by" Linked Group Details   200 mg 580 mL/hr over 30 Minutes Intravenous Once 04/18/20 3557 04/18/20 0850      Subjective/Interval History: Patient continues to have shortness of breath with minimal exertion.  Continues to have a dry cough.  No chest pain.  No nausea vomiting.    Assessment/Plan:  Acute Hypoxic Resp. Failure/Pneumonia due to COVID-19  Recent Labs  Lab 04/18/20 0338 04/18/20 1700 04/19/20 0706 04/20/20 0611 04/20/20 0641  FERRITIN  --  791* 749*  --   --   CRP  --  14.7* 8.1*  --  2.3*  ALT 69*  --  66* 66*  --   PROCALCITON 0.21  --   --   --   --     D [facility]imer at [facility] ng/ml: [phone]   Objective findings: Oxygen requirements: On 2 L of oxygen by nasal cannula saturating in the late 90s.  COVID 19 Therapeutics: Antibacterials: None.  Procalcitonin was 0.21. Remdesivir: Day 3 Steroids: Solu-Medrol Diuretics: None Inhaled Steroids: None currently Actemra/Baricitinib: None PUD Prophylaxis: Protonix DVT Prophylaxis:  Lovenox  From a  respiratory standpoint patient seems to be stable.  She remains on Remdesivir and steroids.  Inflammatory markers were significantly elevated with CRP of 14.7.  Improved to 2.3.  D-dimer not significantly elevated.  Seems to be improving as well.  Continue to mobilize, incentive spirometry, prone positioning.  Mild transaminitis is likely due to COVID-19.  Abdominal x-ray done yesterday did not show any acute findings.  The treatment plan and use of medications and known side effects were discussed with patient/family. Some of the medications used are based on case reports/anecdotal data.  All other medications being used in the management of COVID-19 based on limited study data.  Complete risks and long-term side effects are unknown, however in the best clinical judgment they seem to be of some benefit.  Patient/family wanted to proceed with treatment options provided.  Abdominal pain Likely due to coughing spells.  X-ray was unremarkable.  Seems to be better this morning.  Hypokalemia Repleted.  Continue to check periodically.  Transaminitis Secondary to COVID-19.  Essential hypertension Continue Lotensin.  Hyperglycemia secondary to steroids/diabetes mellitus type 2, uncontrolled HbA1c 6.9 earlier this month.  Elevated glucose levels due to steroids.  Will increase the dose of Levemir.  Continue SSI.  History of GERD Continue PPI  Anxiety disorder Xanax and Zoloft to be continued.  Obesity Estimated body mass index is 40.74 kg/m as calculated from the following:   Height as of this encounter: 5\' 3"  (1.6 m).   Weight as of this  encounter: 104.3 kg.   DVT Prophylaxis: Lovenox Code Status: Full code Family Communication: Discussed with the patient.  Will update family later today Disposition Plan: Hopefully return home when improved  Status is: Inpatient  Remains inpatient appropriate because:IV treatments appropriate due to intensity of illness or inability to take PO and  Inpatient level of care appropriate due to severity of illness   Dispo: The patient is from: Home              Anticipated d/c is to: Home              Anticipated d/c date is: 2 days              Patient currently is not medically stable to d/c.      Medications:  Scheduled: . vitamin C  500 mg Oral Daily  . benazepril  10 mg Oral Daily  . benzonatate  100 mg Oral TID  . dextromethorphan-guaiFENesin  1 tablet Oral BID  . enoxaparin (LOVENOX) injection  0.5 mg/kg Subcutaneous Q24H  . insulin aspart  0-5 Units Subcutaneous QHS  . insulin aspart  0-9 Units Subcutaneous TID WC  . insulin aspart  5 Units Subcutaneous TID WC  . insulin detemir  10 Units Subcutaneous BID  . ipratropium  2 puff Inhalation Q4H  . methylPREDNISolone (SOLU-MEDROL) injection  1 mg/kg Intravenous Q12H  . pantoprazole  40 mg Oral Daily  . sertraline  100 mg Oral Daily  . zinc sulfate  220 mg Oral Daily   Continuous: . remdesivir 100 mg in NS 100 mL 100 mg (04/20/20 0921)   TZG:[id number], albuterol, ALPRAZolam, chlorpheniramine-HYDROcodone, hydrALAZINE, loperamide, ondansetron (ZOFRAN) IV, oxyCODONE   Objective:  Vital Signs  Vitals:   04/20/20 0040 04/20/20 0527 04/20/20 0827 04/20/20 1210  BP: (!) 167/93 (!) 165/88 (!) 145/68 (!) 159/91  Pulse: 71 65 85 85  Resp: 16 18 18 20   Temp: 97.6 F (36.4 C) 97.6 F (36.4 C) 98.7 F (37.1 C) 98 F (36.7 C)  TempSrc: Oral Oral Oral Oral  SpO2: 94% 94% 94% 95%  Weight:      Height:        Intake/Output Summary (Last 24 hours) at 04/20/2020 1350 Last data filed at 04/20/2020 0527 Gross per 24 hour  Intake 360 ml  Output 1075 ml  Net -715 ml   Filed Weights   04/18/20 0313  Weight: 104.3 kg    General appearance: Awake alert.  In no distress Resp: Tachypneic at rest.  Coarse breath sounds with crackles at the bases bilaterally.  No wheezing or rhonchi. Cardio: S1-S2 is normal regular.  No S3-S4.  No rubs murmurs or bruit GI: Abdomen is  soft.  Nontender nondistended.  Bowel sounds are present normal.  No masses organomegaly Extremities: No edema.  Moving all her extremities Neurologic: Alert and oriented x3.  No focal neurological deficits.    Lab Results:  Data Reviewed: I have personally reviewed following labs and imaging studies  CBC: Recent Labs  Lab 04/18/20 0338 04/19/20 0706 04/20/20 0640  WBC 6.4 4.2 8.2  NEUTROABS 4.9 2.7 6.5  HGB 13.2 12.5 13.1  HCT 39.0 38.0 40.1  MCV 88.4 89.8 89.9  PLT 199 258 638    Basic Metabolic Panel: Recent Labs  Lab 04/18/20 0338 04/18/20 0717 04/19/20 0706 04/20/20 0611  NA 142  --  137 137  K 3.3*  --  3.5 3.7  CL 104  --  99 99  CO2 26  --  26 25  GLUCOSE 297*  --  340* 324*  BUN 13  --  23* 28*  CREATININE 0.63  --  0.55 0.47  CALCIUM 9.4  --  9.2 9.4  MG  --  2.3  --   --     GFR: Estimated Creatinine Clearance: 88.6 mL/min (by C-G formula based on SCr of 0.47 mg/dL).  Liver Function Tests: Recent Labs  Lab 04/18/20 0338 04/19/20 0706 04/20/20 0611  AST 75* 56* 50*  ALT 69* 66* 66*  ALKPHOS 257* 216* 183*  BILITOT 0.9 0.9 0.9  PROT 8.1 7.7 7.3  ALBUMIN 3.7 3.4* 3.4*     CBG: Recent Labs  Lab 04/19/20 1137 04/19/20 1656 04/19/20 2133 04/20/20 0825 04/20/20 1209  GLUCAP 323* 310* 336* 327* 390*    Lipid Profile: Recent Labs    04/18/20 1700  TRIG 127    Anemia Panel: Recent Labs    04/18/20 1700 04/19/20 0706  FERRITIN 791* 749*    Recent Results (from the past 240 hour(s))  Resp Panel by RT PCR (RSV, Flu A&B, Covid) - Nasopharyngeal Swab     Status: Abnormal   Collection Time: 04/18/20  3:38 AM   Specimen: Nasopharyngeal Swab  Result Value Ref Range Status   SARS Coronavirus 2 by RT PCR POSITIVE (A) NEGATIVE Final    Comment: RESULT CALLED TO, READ BACK BY AND VERIFIED WITH: PAIGE JOHNSON AT 0619 ON 04/18/2020 [place]. (NOTE) SARS-CoV-2 target nucleic acids are DETECTED.  SARS-CoV-2 RNA is generally detectable in  upper respiratory specimens  during the acute phase of infection. Positive results are indicative of the presence of the identified virus, but do not rule out bacterial infection or co-infection with other pathogens not detected by the test. Clinical correlation with patient history and other diagnostic information is necessary to determine patient infection status. The expected result is Negative.  Fact Sheet for Patients:  PinkCheek.be  Fact Sheet for Healthcare Providers: GravelBags.it  This test is not yet approved or cleared by the [place] FDA and  has been authorized for detection and/or diagnosis of SARS-CoV-2 by FDA under an Emergency Use Authorization (EUA).  This EUA will remain in effect (meaning this test c an be used) for the duration of  the COVID-19 declaration under Section 564(b)(1) of the Act, 21 U.S.C. section 360bbb-3(b)(1), unless the authorization is terminated or revoked sooner.      Influenza A by PCR NEGATIVE NEGATIVE Final   Influenza B by PCR NEGATIVE NEGATIVE Final    Comment: (NOTE) The Xpert Xpress SARS-CoV-2/FLU/RSV assay is intended as an aid in  the diagnosis of influenza from Nasopharyngeal swab specimens and  should not be used as a sole basis for treatment. Nasal washings and  aspirates are unacceptable for Xpert Xpress SARS-CoV-2/FLU/RSV  testing.  Fact Sheet for Patients: PinkCheek.be  Fact Sheet for Healthcare Providers: GravelBags.it  This test is not yet approved or cleared by the [place] FDA and  has been authorized for detection and/or diagnosis of SARS-CoV-2 by  FDA under an Emergency Use Authorization (EUA). This EUA will remain  in effect (meaning this test can be used) for the duration of the  Covid-19 declaration under Section 564(b)(1) of the Act, 21  U.S.C. section 360bbb-3(b)(1), unless the  authorization is  terminated or revoked.    Respiratory Syncytial Virus by PCR NEGATIVE NEGATIVE Final    Comment: (NOTE) Fact Sheet for Patients: PinkCheek.be  Fact Sheet for Healthcare Providers: GravelBags.it  This test is not  yet approved or cleared by the [place] and  has been authorized for detection and/or diagnosis of SARS-CoV-2 by  FDA under an Emergency Use Authorization (EUA). This EUA will remain  in effect (meaning this test can be used) for the duration of the  COVID-19 declaration under Section 564(b)(1) of the Act, 21 U.S.C.  section 360bbb-3(b)(1), unless the authorization is terminated or  revoked. Performed at [facility], [address] [place], [place] [address]       Radiology Studies: DG Abd Portable 1V  Result Date: 04/19/2020 CLINICAL DATA:  Stomach ache EXAM: PORTABLE ABDOMEN - 1 VIEW COMPARISON:  Portable exam 1349 hours without priors for comparison FINDINGS: Normal bowel gas pattern. No bowel dilatation or bowel wall thickening. Surgical clips RIGHT upper quadrant question cholecystectomy. Osseous structures unremarkable. No urinary tract calcification. IMPRESSION: Normal exam. Electronically Signed   By: Lavonia Dana M.D.   On: 04/19/2020 15:23       LOS: 2 days   [facility] Pager on [email or web address]  04/20/2020, 1:50 PM

## 2020-04-20 NOTE — Progress Notes (Signed)
Inpatient Diabetes Program Recommendations  AACE/ADA: New Consensus Statement on Inpatient Glycemic Control (2015)  Target Ranges:  Prepandial:   less than 140 mg/dL      Peak postprandial:   less than 180 mg/dL (1-2 hours)      Critically ill patients:  140 - 180 mg/dL   Lab Results  Component Value Date   GLUCAP 327 (H) 04/20/2020   HGBA1C 6.9 (A) 03/31/2020    Review of Glycemic Control Results for Kristen Sparks, Kristen Sparks (MRN [id number]) as of 04/20/2020 10:59  Ref. Range 04/18/2020 21:22 04/19/2020 08:24 04/19/2020 11:37 04/19/2020 16:56 04/19/2020 21:33 04/20/2020 08:25  Glucose-Capillary Latest Ref Range: 70 - 99 mg/dL 279 (H) 322 (H) 323 (H) 310 (H) 336 (H) 327 (H)   Diabetes history:DM 2 Outpatient Diabetes medications:None listed Current orders for Inpatient glycemic control: Novolog sensitive tid with meals and HS Solumedrol 60 mg IV q 12 hours Levemir 10 units bid Novolog 5 units tid with meals  Inpatient Diabetes Program Recommendations:   Please increase Levemir to 20 units bid.  Thanks,  Adah Perl, RN, BC-ADM Inpatient Diabetes Coordinator Pager [phone] (8a-5p)

## 2020-04-21 ENCOUNTER — Inpatient Hospital Stay: Payer: BC Managed Care – PPO

## 2020-04-21 LAB — CBC WITH DIFFERENTIAL/PLATELET
Abs Immature Granulocytes: 0.18 10*3/uL — ABNORMAL HIGH (ref 0.00–0.07)
Basophils Absolute: 0 10*3/uL (ref 0.0–0.1)
Basophils Relative: 0 %
Eosinophils Absolute: 0 10*3/uL (ref 0.0–0.5)
Eosinophils Relative: 0 %
HCT: 41.2 % (ref 36.0–46.0)
Hemoglobin: 13.7 g/dL (ref 12.0–15.0)
Immature Granulocytes: 2 %
Lymphocytes Relative: 21 %
Lymphs Abs: 2.2 10*3/uL (ref 0.7–4.0)
MCH: 30 pg (ref 26.0–34.0)
MCHC: 33.3 g/dL (ref 30.0–36.0)
MCV: 90.4 fL (ref 80.0–100.0)
Monocytes Absolute: 0.8 10*3/uL (ref 0.1–1.0)
Monocytes Relative: 8 %
Neutro Abs: 7.5 10*3/uL (ref 1.7–7.7)
Neutrophils Relative %: 69 %
Platelets: 326 10*3/uL (ref 150–400)
RBC: 4.56 MIL/uL (ref 3.87–5.11)
RDW: 13.5 % (ref 11.5–15.5)
WBC: 10.7 10*3/uL — ABNORMAL HIGH (ref 4.0–10.5)
nRBC: 0 % (ref 0.0–0.2)

## 2020-04-21 LAB — COMPREHENSIVE METABOLIC PANEL
ALT: 61 U/L — ABNORMAL HIGH (ref 0–44)
AST: 28 U/L (ref 15–41)
Albumin: 3.5 g/dL (ref 3.5–5.0)
Alkaline Phosphatase: 174 U/L — ABNORMAL HIGH (ref 38–126)
Anion gap: 11 (ref 5–15)
BUN: 29 mg/dL — ABNORMAL HIGH (ref 6–20)
CO2: 28 mmol/L (ref 22–32)
Calcium: 9.2 mg/dL (ref 8.9–10.3)
Chloride: 100 mmol/L (ref 98–111)
Creatinine, Ser: 0.51 mg/dL (ref 0.44–1.00)
GFR, Estimated: >60 mL/min (ref >60)
Glucose, Bld: 246 mg/dL — ABNORMAL HIGH (ref 70–99)
Potassium: 3.2 mmol/L — ABNORMAL LOW (ref 3.5–5.1)
Sodium: 139 mmol/L (ref 135–145)
Total Bilirubin: 0.8 mg/dL (ref 0.3–1.2)
Total Protein: 7.3 g/dL (ref 6.5–8.1)

## 2020-04-21 LAB — GLUCOSE, CAPILLARY
Glucose-Capillary: 207 mg/dL — ABNORMAL HIGH (ref 70–99)
Glucose-Capillary: 149 mg/dL — ABNORMAL HIGH (ref 70–99)
Glucose-Capillary: 174 mg/dL — ABNORMAL HIGH (ref 70–99)
Glucose-Capillary: 176 mg/dL — ABNORMAL HIGH (ref 70–99)

## 2020-04-21 LAB — FIBRIN DERIVATIVES D-DIMER (ARMC ONLY): Fibrin derivatives D-dimer (ARMC): 471.09 ng/mL (FEU) (ref 0.00–499.00)

## 2020-04-21 LAB — C-REACTIVE PROTEIN: CRP: 1 mg/dL — ABNORMAL HIGH (ref <1.0)

## 2020-04-21 MED ORDER — FUROSEMIDE 10 MG/ML IJ SOLN
40.0000 mg | Freq: Once | INTRAMUSCULAR | Status: AC
  Administered 2020-04-21: 15:00:00 40 mg via INTRAVENOUS
  Filled 2020-04-21: qty 4

## 2020-04-21 MED ORDER — POTASSIUM CHLORIDE CRYS ER 20 MEQ PO TBCR
40.0000 meq | EXTENDED_RELEASE_TABLET | Freq: Once | ORAL | Status: AC
  Administered 2020-04-21: 17:00:00 40 meq via ORAL
  Filled 2020-04-21: qty 2

## 2020-04-21 MED ORDER — IBUPROFEN 400 MG PO TABS
400.0000 mg | ORAL_TABLET | Freq: Three times a day (TID) | ORAL | Status: AC
  Administered 2020-04-21 – 2020-04-22 (×3): 400 mg via ORAL
  Filled 2020-04-21 (×2): qty 1

## 2020-04-21 MED ORDER — ALPRAZOLAM 0.5 MG PO TABS
0.5000 mg | ORAL_TABLET | Freq: Two times a day (BID) | ORAL | Status: DC | PRN
  Administered 2020-04-21 – 2020-04-22 (×2): 0.5 mg via ORAL
  Filled 2020-04-21 (×2): qty 1

## 2020-04-21 MED ORDER — POTASSIUM CHLORIDE CRYS ER 20 MEQ PO TBCR
40.0000 meq | EXTENDED_RELEASE_TABLET | Freq: Once | ORAL | Status: AC
  Administered 2020-04-21: 08:00:00 40 meq via ORAL
  Filled 2020-04-21: qty 2

## 2020-04-21 NOTE — Progress Notes (Signed)
PROGRESS NOTE  Kristen Sparks:[id number] DOB: [birth year] DOA: 04/18/2020  PCP: Lavera Guise, MD  Brief History/Interval Summary: 59 y.o.femalewith medical history significant ofhypertension, hyperlipidemia, diabetes mellitus, asthma, GERD, depression, anxiety, rheumatoid arthritis, borderline OSA, obesity, who presented with cough, shortness breath.  Patient diagnosed with pneumonia due to COVID-19.  Hospitalized for further management.  Reason for Visit: Pneumonia due to COVID-19.  Consultants: None  Procedures: None  Antibiotics: Anti-infectives (From admission, onward)   Start     Dose/Rate Route Frequency Ordered Stop   04/19/20 1000  remdesivir 100 mg in sodium chloride 0.9 % 100 mL IVPB       "Followed by" Linked Group Details   100 mg 200 mL/hr over 30 Minutes Intravenous Daily 04/18/20 0649 04/23/20 0959   04/18/20 0700  remdesivir 200 mg in sodium chloride 0.9% 250 mL IVPB       "Followed by" Linked Group Details   200 mg 580 mL/hr over 30 Minutes Intravenous Once 04/18/20 6295 04/18/20 0850      Subjective/Interval History: Patient continues to have pleuritic chest pain in the left chest area.  Mainly with coughing and deep breathing.  Otherwise noted to be very anxious.  Shortness of breath has improved.      Assessment/Plan:  Acute Hypoxic Resp. Failure/Pneumonia due to COVID-19  Recent Labs  Lab 04/18/20 0338 04/18/20 1700 04/19/20 0706 04/20/20 0611 04/20/20 0641 04/21/20 0510  FERRITIN  --  791* 749*  --   --   --   CRP  --  14.7* 8.1*  --  2.3* 1.0*  ALT 69*  --  66* 66*  --  61*  PROCALCITON 0.21  --   --   --   --   --     D Dimer at [facility] ng/ml: [phone]   Objective findings: Oxygen requirements: Remains on nasal cannula at 2 L/min.  Saturations noted to be in the mid 90s  COVID 19 Therapeutics: Antibacterials: None.  Procalcitonin was 0.21. Remdesivir: Day 4 Steroids: Solu-Medrol Diuretics: None Inhaled Steroids: None  currently Actemra/Baricitinib: None PUD Prophylaxis: Protonix DVT Prophylaxis:  Lovenox  From a respiratory standpoint patient remains stable.  She is on Remdesivir and steroids.  Inflammatory markers were elevated and have improved.  D-dimer is also improved.  CT angiogram did not show any pulmonary embolism.  Patient continues to have pleuritic chest pain.  She was reassured.  Use analgesic agents.  Since her creatinine is normal we can lose Toradol.  Continue to mobilize, incentive spirometry, prone positioning.  LFTs have improved.  The treatment plan and use of medications and known side effects were discussed with patient/family. Some of the medications used are based on case reports/anecdotal data.  All other medications being used in the management of COVID-19 based on limited study data.  Complete risks and long-term side effects are unknown, however in the best clinical judgment they seem to be of some benefit.  Patient/family wanted to proceed with treatment options provided.  Abdominal pain Likely due to coughing spells.  X-ray was unremarkable.    Hypokalemia Noted to be low again today.  Will be repleted.  Will check magnesium.  Transaminitis Secondary to COVID-19.  Seems to be improving  Essential hypertension Continue Lotensin.  Hyperglycemia secondary to steroids/diabetes mellitus type 2, uncontrolled HbA1c 6.9 earlier this month.  Elevated glucose levels due to steroids.  Dose of Levemir was increased yesterday.  CBGs noted to be slightly better.  Continue to monitor for now.  Continue SSI.  History of GERD Continue PPI  Anxiety disorder Xanax and Zoloft to be continued.  Obesity Estimated body mass index is 40.74 kg/m as calculated from the following:   Height as of this encounter: 5\' 3"  (1.6 m).   Weight as of this encounter: 104.3 kg.   DVT Prophylaxis: Lovenox Code Status: Full code Family Communication: Discussed with the patient.  Husband was updated  yesterday.  Will do so again today. Disposition Plan: Hopefully return home when improved  Status is: Inpatient  Remains inpatient appropriate because:IV treatments appropriate due to intensity of illness or inability to take PO and Inpatient level of care appropriate due to severity of illness   Dispo: The patient is from: Home              Anticipated d/c is to: Home              Anticipated d/c date is: 10/29              Patient currently is not medically stable to d/c.     Medications:  Scheduled: . vitamin C  500 mg Oral Daily  . benazepril  10 mg Oral Daily  . benzonatate  100 mg Oral TID  . dextromethorphan-guaiFENesin  1 tablet Oral BID  . enoxaparin (LOVENOX) injection  0.5 mg/kg Subcutaneous Q24H  . insulin aspart  0-20 Units Subcutaneous TID WC  . insulin aspart  0-5 Units Subcutaneous QHS  . insulin aspart  5 Units Subcutaneous TID WC  . insulin detemir  20 Units Subcutaneous BID  . ipratropium  2 puff Inhalation Q4H  . methylPREDNISolone (SOLU-MEDROL) injection  60 mg Intravenous Q12H  . pantoprazole  40 mg Oral Daily  . sertraline  100 mg Oral Daily  . zinc sulfate  220 mg Oral Daily   Continuous: . remdesivir 100 mg in NS 100 mL 100 mg (04/21/20 0810)   PJA:SNKNLZJQBHALP, albuterol, ALPRAZolam, chlorpheniramine-HYDROcodone, hydrALAZINE, loperamide, ondansetron (ZOFRAN) IV, oxyCODONE   Objective:  Vital Signs  Vitals:   04/20/20 2358 04/21/20 0032 04/21/20 0501 04/21/20 0751  BP: (!) 176/74 (!) 163/77 (!) 149/96 (!) 144/59  Pulse: 70 71 64 71  Resp: 20  20 20   Temp: (!) 97.5 F (36.4 C)  (!) 96.2 F (35.7 C) 97.9 F (36.6 C)  TempSrc: Oral  Axillary Oral  SpO2: 93%  97% 93%  Weight:      Height:        Intake/Output Summary (Last 24 hours) at 04/21/2020 1111 Last data filed at 04/21/2020 1000 Gross per 24 hour  Intake 240 ml  Output --  Net 240 ml   Filed Weights   04/18/20 0313  Weight: 104.3 kg    General appearance: Awake alert.   In no distress.  Noted to be anxious Resp: Normal effort.  Coarse breath sounds bilaterally with few crackles at the bases.  No wheezing or rhonchi. No lesions noted over the left chest wall. Cardio: S1-S2 is normal regular.  No S3-S4.  No rubs murmurs or bruit GI: Abdomen is soft.  Nontender nondistended.  Bowel sounds are present normal.  No masses organomegaly Extremities: No edema.  Full range of motion of lower extremities. Neurologic: Alert and oriented x3.  No focal neurological deficits.     Lab Results:  Data Reviewed: I have personally reviewed following labs and imaging studies  CBC: Recent Labs  Lab 04/18/20 0338 04/19/20 0706 04/20/20 0640 04/21/20 0510  WBC 6.4 4.2 8.2 10.7*  NEUTROABS  4.9 2.7 6.5 7.5  HGB 13.2 12.5 13.1 13.7  HCT 39.0 38.0 40.1 41.2  MCV 88.4 89.8 89.9 90.4  PLT 199 258 321 751    Basic Metabolic Panel: Recent Labs  Lab 04/18/20 0338 04/18/20 0717 04/19/20 0706 04/20/20 0611 04/21/20 0510  NA 142  --  137 137 139  K 3.3*  --  3.5 3.7 3.2*  CL 104  --  99 99 100  CO2 26  --  26 25 28   GLUCOSE 297*  --  340* 324* 246*  BUN 13  --  23* 28* 29*  CREATININE 0.63  --  0.55 0.47 0.51  CALCIUM 9.4  --  9.2 9.4 9.2  MG  --  2.3  --   --   --     GFR: Estimated Creatinine Clearance: 88.6 mL/min (by C-G formula based on SCr of 0.51 mg/dL).  Liver Function Tests: Recent Labs  Lab 04/18/20 0338 04/19/20 0706 04/20/20 0611 04/21/20 0510  AST 75* 56* 50* 28  ALT 69* 66* 66* 61*  ALKPHOS 257* 216* 183* 174*  BILITOT 0.9 0.9 0.9 0.8  PROT 8.1 7.7 7.3 7.3  ALBUMIN 3.7 3.4* 3.4* 3.5     CBG: Recent Labs  Lab 04/20/20 0825 04/20/20 1209 04/20/20 1617 04/20/20 2115 04/21/20 0750  GLUCAP 327* 390* 342* 312* 207*    Lipid Profile: Recent Labs    04/18/20 1700  TRIG 127    Anemia Panel: Recent Labs    04/18/20 1700 04/19/20 0706  FERRITIN 791* 749*    Recent Results (from the past 240 hour(s))  Resp Panel by RT PCR  (RSV, Flu A&B, Covid) - Nasopharyngeal Swab     Status: Abnormal   Collection Time: 04/18/20  3:38 AM   Specimen: Nasopharyngeal Swab  Result Value Ref Range Status   SARS Coronavirus 2 by RT PCR POSITIVE (A) NEGATIVE Final    Comment: RESULT CALLED TO, READ BACK BY AND VERIFIED WITH: PAIGE JOHNSON AT 0619 ON 04/18/2020 [place]. (NOTE) SARS-CoV-2 target nucleic acids are DETECTED.  SARS-CoV-2 RNA is generally detectable in upper respiratory specimens  during the acute phase of infection. Positive results are indicative of the presence of the identified virus, but do not rule out bacterial infection or co-infection with other pathogens not detected by the test. Clinical correlation with patient history and other diagnostic information is necessary to determine patient infection status. The expected result is Negative.  Fact Sheet for Patients:  PinkCheek.be  Fact Sheet for Healthcare Providers: GravelBags.it  This test is not yet approved or cleared by the [place] FDA and  has been authorized for detection and/or diagnosis of SARS-CoV-2 by FDA under an Emergency Use Authorization (EUA).  This EUA will remain in effect (meaning this test c an be used) for the duration of  the COVID-19 declaration under Section 564(b)(1) of the Act, 21 U.S.C. section 360bbb-3(b)(1), unless the authorization is terminated or revoked sooner.      Influenza A by PCR NEGATIVE NEGATIVE Final   Influenza B by PCR NEGATIVE NEGATIVE Final    Comment: (NOTE) The Xpert Xpress SARS-CoV-2/FLU/RSV assay is intended as an aid in  the diagnosis of influenza from Nasopharyngeal swab specimens and  should not be used as a sole basis for treatment. Nasal washings and  aspirates are unacceptable for Xpert Xpress SARS-CoV-2/FLU/RSV  testing.  Fact Sheet for Patients: PinkCheek.be  Fact Sheet for Healthcare  Providers: GravelBags.it  This test is not yet approved or cleared by  the [employer] and  has been authorized for detection and/or diagnosis of SARS-CoV-2 by  FDA under an Emergency Use Authorization (EUA). This EUA will remain  in effect (meaning this test can be used) for the duration of the  Covid-19 declaration under Section 564(b)(1) of the Act, 21  U.S.C. section 360bbb-3(b)(1), unless the authorization is  terminated or revoked.    Respiratory Syncytial Virus by PCR NEGATIVE NEGATIVE Final    Comment: (NOTE) Fact Sheet for Patients: PinkCheek.be  Fact Sheet for Healthcare Providers: GravelBags.it  This test is not yet approved or cleared by the [place] FDA and  has been authorized for detection and/or diagnosis of SARS-CoV-2 by  FDA under an Emergency Use Authorization (EUA). This EUA will remain  in effect (meaning this test can be used) for the duration of the  COVID-19 declaration under Section 564(b)(1) of the Act, 21 U.S.C.  section 360bbb-3(b)(1), unless the authorization is terminated or  revoked. Performed at [facility], [address] [place], [place] [address]       Radiology Studies: DG Abd Portable 1V  Result Date: 04/19/2020 CLINICAL DATA:  Stomach ache EXAM: PORTABLE ABDOMEN - 1 VIEW COMPARISON:  Portable exam 1349 hours without priors for comparison FINDINGS: Normal bowel gas pattern. No bowel dilatation or bowel wall thickening. Surgical clips RIGHT upper quadrant question cholecystectomy. Osseous structures unremarkable. No urinary tract calcification. IMPRESSION: Normal exam. Electronically Signed   By: Lavonia Dana M.D.   On: 04/19/2020 15:23       LOS: 3 days   [facility] Pager on [email or web address]  04/21/2020, 11:11 AM

## 2020-04-22 LAB — MAGNESIUM: Magnesium: 2.4 mg/dL (ref 1.7–2.4)

## 2020-04-22 LAB — CBC WITH DIFFERENTIAL/PLATELET
Abs Immature Granulocytes: 0.21 10*3/uL — ABNORMAL HIGH (ref 0.00–0.07)
Basophils Absolute: 0 10*3/uL (ref 0.0–0.1)
Basophils Relative: 0 %
Eosinophils Absolute: 0 10*3/uL (ref 0.0–0.5)
Eosinophils Relative: 0 %
HCT: 39.8 % (ref 36.0–46.0)
Hemoglobin: 13.2 g/dL (ref 12.0–15.0)
Immature Granulocytes: 3 %
Lymphocytes Relative: 18 %
Lymphs Abs: 1.3 10*3/uL (ref 0.7–4.0)
MCH: 29.8 pg (ref 26.0–34.0)
MCHC: 33.2 g/dL (ref 30.0–36.0)
MCV: 89.8 fL (ref 80.0–100.0)
Monocytes Absolute: 0.3 10*3/uL (ref 0.1–1.0)
Monocytes Relative: 4 %
Neutro Abs: 5.6 10*3/uL (ref 1.7–7.7)
Neutrophils Relative %: 75 %
Platelets: 303 10*3/uL (ref 150–400)
RBC: 4.43 MIL/uL (ref 3.87–5.11)
RDW: 13.5 % (ref 11.5–15.5)
WBC: 7.5 10*3/uL (ref 4.0–10.5)
nRBC: 0 % (ref 0.0–0.2)

## 2020-04-22 LAB — GLUCOSE, CAPILLARY
Glucose-Capillary: 310 mg/dL — ABNORMAL HIGH (ref 70–99)
Glucose-Capillary: 278 mg/dL — ABNORMAL HIGH (ref 70–99)
Glucose-Capillary: 352 mg/dL — ABNORMAL HIGH (ref 70–99)
Glucose-Capillary: 193 mg/dL — ABNORMAL HIGH (ref 70–99)

## 2020-04-22 LAB — C-REACTIVE PROTEIN: CRP: 2.7 mg/dL — ABNORMAL HIGH (ref <1.0)

## 2020-04-22 LAB — COMPREHENSIVE METABOLIC PANEL
ALT: 46 U/L — ABNORMAL HIGH (ref 0–44)
AST: 20 U/L (ref 15–41)
Albumin: 3.2 g/dL — ABNORMAL LOW (ref 3.5–5.0)
Alkaline Phosphatase: 167 U/L — ABNORMAL HIGH (ref 38–126)
Anion gap: 10 (ref 5–15)
BUN: 29 mg/dL — ABNORMAL HIGH (ref 6–20)
CO2: 27 mmol/L (ref 22–32)
Calcium: 9 mg/dL (ref 8.9–10.3)
Chloride: 101 mmol/L (ref 98–111)
Creatinine, Ser: 0.57 mg/dL (ref 0.44–1.00)
GFR, Estimated: >60 mL/min (ref >60)
Glucose, Bld: 362 mg/dL — ABNORMAL HIGH (ref 70–99)
Potassium: 4.4 mmol/L (ref 3.5–5.1)
Sodium: 138 mmol/L (ref 135–145)
Total Bilirubin: 0.8 mg/dL (ref 0.3–1.2)
Total Protein: 6.8 g/dL (ref 6.5–8.1)

## 2020-04-22 LAB — FIBRIN DERIVATIVES D-DIMER (ARMC ONLY): Fibrin derivatives D-dimer (ARMC): 414.45 ng/mL (FEU) (ref 0.00–499.00)

## 2020-04-22 MED ORDER — FUROSEMIDE 10 MG/ML IJ SOLN
20.0000 mg | Freq: Once | INTRAMUSCULAR | Status: AC
  Administered 2020-04-22: 20 mg via INTRAVENOUS
  Filled 2020-04-22: qty 2

## 2020-04-22 MED ORDER — METHYLPREDNISOLONE SODIUM SUCC 40 MG IJ SOLR
40.0000 mg | Freq: Two times a day (BID) | INTRAMUSCULAR | Status: DC
  Administered 2020-04-23 (×2): 40 mg via INTRAVENOUS
  Filled 2020-04-22 (×2): qty 1

## 2020-04-22 NOTE — Evaluation (Signed)
Physical Therapy Evaluation Patient Details Name: Kristen Sparks MRN: [id number] DOB: 22-Feb-1961 Today's Date: 04/22/2020   History of Present Illness  Kristen Sparks is a 60yoF who comes to [facility] on 10/25 c SOB. Pt tested (+) for covid19 at home on 10/18. PMH: HTN, HLD, DM, asthma, GERD, depression, GAD, RA.  Clinical Impression  Pt admitted with above diagnosis. Pt currently with functional limitations due to the deficits listed below (see "PT Problem List"). Upon entry, pt in bed, awake and agreeable to participate. The pt is alert and oriented x4, pleasant, conversational, and generally a good historian. Pt recently weened to 1L/min, 90% SpO2 at entry. Pt moved to 2L/min for transfers, desats to 89%. Pt AMB on 3L/min very slowly, ends at 89%. ModI bed mobility and transfers, limited by malaise and subjective weakness. Pt coughing quite a bit and AMB limited by stress incontinence while up, however pt likely would not tolerate much longer walking due to fatigue, weakness, anxiety. Functional mobility assessment demonstrates increased effort/time requirements, fair tolerance, but no frank need for physical assistance, whereas the patient performed these at a higher level of independence PTA. Pt will need a RW for home, like will need O2 as well. Pt will benefit from skilled PT intervention to increase independence and safety with basic mobility in preparation for discharge to the venue listed below.       Follow Up Recommendations Home health PT    Equipment Recommendations  Rolling walker with 5" wheels    Recommendations for Other Services       Precautions / Restrictions Precautions Precautions: Fall Restrictions Weight Bearing Restrictions: No      Mobility  Bed Mobility Overal bed mobility: Modified Independent                  Transfers Overall transfer level: Modified independent Equipment used: Rolling walker (2 wheeled);None             General transfer comment:  subjective weakness, unsteadiness  Ambulation/Gait Ambulation/Gait assistance: Min guard Gait Distance (Feet): 5 Feet Assistive device: 1 person hand held assist       General Gait Details: slow and weak; does better later with RW, more confident  Stairs            Wheelchair Mobility    Modified Rankin (Stroke Patients Only)       Balance                                             Pertinent Vitals/Pain Pain Assessment: No/denies pain    Home Living Family/patient expects to be discharged to:: Private residence Living Arrangements: Spouse/significant other Available Help at Discharge: Family Type of Home: Mobile home Home Access: Stairs to enter Entrance Stairs-Rails: Can reach both Entrance Stairs-Number of Steps: 3 Home Layout: One level Home Equipment: None      Prior Function Level of Independence: Independent               Hand Dominance        Extremity/Trunk Assessment   Upper Extremity Assessment Upper Extremity Assessment: Generalized weakness    Lower Extremity Assessment Lower Extremity Assessment: Generalized weakness       Communication      Cognition Arousal/Alertness: Awake/alert Behavior During Therapy: WFL for tasks assessed/performed;Anxious Overall Cognitive Status: Within Functional Limits for tasks assessed  General Comments      Exercises Other Exercises Other Exercises: 5xSTS from EOB c RW Other Exercises: 73ft AMB c RW, O2 to recliner, minGuard assist   Assessment/Plan    PT Assessment Patient needs continued PT services  PT Problem List Decreased strength;Decreased range of motion;Decreased activity tolerance;Decreased balance;Decreased mobility;Decreased knowledge of use of DME;Decreased safety awareness;Decreased knowledge of precautions;Cardiopulmonary status limiting activity       PT Treatment Interventions DME  instruction;Balance training;Gait training;Stair training;Functional mobility training;Therapeutic activities;Therapeutic exercise;Patient/family education    PT Goals (Current goals can be found in the Care Plan section)  Acute Rehab PT Goals Patient Stated Goal: Regain strength and mobility ability PT Goal Formulation: With patient Time For Goal Achievement: 05/06/20 Potential to Achieve Goals: Good    Frequency Min 2X/week   Barriers to discharge Inaccessible home environment      Co-evaluation               AM-PAC PT "6 Clicks" Mobility  Outcome Measure Help needed turning from your back to your side while in a flat bed without using bedrails?: A Little Help needed moving from lying on your back to sitting on the side of a flat bed without using bedrails?: A Little Help needed moving to and from a bed to a chair (including a wheelchair)?: A Little Help needed standing up from a chair using your arms (e.g., wheelchair or bedside chair)?: A Little Help needed to walk in hospital room?: A Little Help needed climbing 3-5 steps with a railing? : A Little 6 Click Score: 18    End of Session Equipment Utilized During Treatment: Oxygen Activity Tolerance: Patient tolerated treatment well;Patient limited by fatigue;No increased pain Patient left: in chair;with call bell/phone within reach Nurse Communication: Mobility status PT Visit Diagnosis: Unsteadiness on feet (R26.81);Other abnormalities of gait and mobility (R26.89);Difficulty in walking, not elsewhere classified (R26.2);Muscle weakness (generalized) (M62.81)    Time: 3212-2482 PT Time Calculation (min) (ACUTE ONLY): 31 min   Charges:   PT Evaluation $PT Eval Low Complexity: 1 Low PT Treatments $Therapeutic Exercise: 8-22 mins        1:07 PM, 04/22/20 Etta Grandchild, PT, DPT Physical Therapist - [facility]  [phone] ([place])    [place] C 04/22/2020, 1:04  PM

## 2020-04-22 NOTE — Progress Notes (Addendum)
PROGRESS NOTE  Kristen Sparks MBW:[id number] DOB: [birth year] DOA: 04/18/2020  PCP: Lavera Guise, MD  Brief History/Interval Summary: 59 y.o.femalewith medical history significant ofhypertension, hyperlipidemia, diabetes mellitus, asthma, GERD, depression, anxiety, rheumatoid arthritis, borderline OSA, obesity, who presented with cough, shortness breath.  Patient diagnosed with pneumonia due to COVID-19.  Hospitalized for further management.  Reason for Visit: Pneumonia due to COVID-19.  Consultants: None  Procedures: None  Antibiotics: Anti-infectives (From admission, onward)   Start     Dose/Rate Route Frequency Ordered Stop   04/19/20 1000  remdesivir 100 mg in sodium chloride 0.9 % 100 mL IVPB       "Followed by" Linked Group Details   100 mg 200 mL/hr over 30 Minutes Intravenous Daily 04/18/20 0649 04/22/20 0919   04/18/20 0700  remdesivir 200 mg in sodium chloride 0.9% 250 mL IVPB       "Followed by" Linked Group Details   200 mg 580 mL/hr over 30 Minutes Intravenous Once 04/18/20 0649 04/18/20 0850      Subjective/Interval History: Patient complains of feeling fatigued.  Shortness of breath seems to be improving.  Continues to have cough.  Left-sided chest pain is better.  seems to be less anxious today.       Assessment/Plan:  Acute Hypoxic Resp. Failure/Pneumonia due to COVID-19  Recent Labs  Lab 04/18/20 0338 04/18/20 1700 04/19/20 0706 04/20/20 0611 04/20/20 0641 04/21/20 0510 04/22/20 0544  FERRITIN  --  791* 749*  --   --   --   --   CRP  --  14.7* 8.1*  --  2.3* 1.0*  --   ALT 69*  --  66* 66*  --  61* 46*  PROCALCITON 0.21  --   --   --   --   --   --     D [facility] at [facility] ng/ml: [phone]   Objective findings: Oxygen requirements: Remains on nasal cannula at 1 to 2 L.  Saturating in the mid 90s.    COVID 19 Therapeutics: Antibacterials: None.  Procalcitonin was 0.21. Remdesivir: Day 5 Steroids: Solu-Medrol Diuretics: Lasix  given yesterday.  We will repeat another dose today. Inhaled Steroids: None currently Actemra/Baricitinib: None PUD Prophylaxis: Protonix DVT Prophylaxis:  Lovenox  From a respiratory standpoint patient seems to be stable.  She will complete 5-day course of Remdesivir today.  Remains on steroids.  Continue incentive spirometry.  Pleuritic chest pain is due to pneumonia.  Chest x-ray was done yesterday which did not show any new findings.  Patient was reassured.  Patient unfortunately has not been mobilized yet.  Feels very fatigued.  Cut back on dose of steroids.  The treatment plan and use of medications and known side effects were discussed with patient/family. Some of the medications used are based on case reports/anecdotal data.  All other medications being used in the management of COVID-19 based on limited study data.  Complete risks and long-term side effects are unknown, however in the best clinical judgment they seem to be of some benefit.  Patient/family wanted to proceed with treatment options provided.  Abdominal pain Likely due to coughing spells.  X-ray was unremarkable.  Seems to be better.  Hypokalemia Repleted.  Magnesium 2.4.  Transaminitis Secondary to COVID-19.  Seems to be improving  Essential hypertension Continue Lotensin.  Reasonably well controlled.  Occasional high readings noted.  Hyperglycemia secondary to steroids/diabetes mellitus type 2, uncontrolled HbA1c 6.9 earlier this month.  Elevated glucose levels due to steroids.  Dose of Levemir was increased on 10/28.  Will cut back on steroids today which should result in decrease in CBGs.  May need to adjust dose of Levemir.  Continue SSI.  History of GERD Continue PPI  Anxiety disorder Xanax and Zoloft to be continued.  Seems to be less anxious today compared to yesterday.  Obesity Estimated body mass index is 40.74 kg/m as calculated from the following:   Height as of this encounter: 5\' 3"  (1.6 m).    Weight as of this encounter: 104.3 kg.   DVT Prophylaxis: Lovenox Code Status: Full code Family Communication: Husband being updated daily Disposition Plan: Hopefully return home when improved.  Mobilize today.  Hopefully discharge by tomorrow.  Status is: Inpatient  Remains inpatient appropriate because:IV treatments appropriate due to intensity of illness or inability to take PO and Inpatient level of care appropriate due to severity of illness   Dispo: The patient is from: Home              Anticipated d/c is to: Home              Anticipated d/c date is: 10/30              Patient currently is not medically stable to d/c.     Medications:  Scheduled: . vitamin C  500 mg Oral Daily  . benazepril  10 mg Oral Daily  . benzonatate  100 mg Oral TID  . dextromethorphan-guaiFENesin  1 tablet Oral BID  . enoxaparin (LOVENOX) injection  0.5 mg/kg Subcutaneous Q24H  . insulin aspart  0-20 Units Subcutaneous TID WC  . insulin aspart  0-5 Units Subcutaneous QHS  . insulin aspart  5 Units Subcutaneous TID WC  . insulin detemir  20 Units Subcutaneous BID  . ipratropium  2 puff Inhalation Q4H  . methylPREDNISolone (SOLU-MEDROL) injection  60 mg Intravenous Q12H  . pantoprazole  40 mg Oral Daily  . sertraline  100 mg Oral Daily  . zinc sulfate  220 mg Oral Daily   Continuous:  XKG:YJEHUDJSHFWYO, albuterol, ALPRAZolam, chlorpheniramine-HYDROcodone, hydrALAZINE, loperamide, ondansetron (ZOFRAN) IV, oxyCODONE   Objective:  Vital Signs  Vitals:   04/21/20 1948 04/21/20 2337 04/22/20 0455 04/22/20 0730  BP: (!) 142/70 116/72 (!) 144/94 (!) 153/68  Pulse: 77 68 69 (!) 55  Resp: 20 18 18 18   Temp: 98.2 F (36.8 C) (!) 97.3 F (36.3 C) (!) 97.2 F (36.2 C) 98.2 F (36.8 C)  TempSrc: Oral Oral  Oral  SpO2: 96% 95% 94% 95%  Weight:      Height:        Intake/Output Summary (Last 24 hours) at 04/22/2020 1106 Last data filed at 04/22/2020 0616 Gross per 24 hour  Intake --   Output 2800 ml  Net -2800 ml   Filed Weights   04/18/20 0313  Weight: 104.3 kg    General appearance: Awake alert.  In no distress.  Less anxious Resp: Normal effort at rest.  Coarse breath sound bilaterally with few crackles at the bases.  No wheezing or rhonchi.   Cardio: S1-S2 is normal regular.  No S3-S4.  No rubs murmurs or bruit GI: Abdomen is soft.  Nontender nondistended.  Bowel sounds are present normal.  No masses organomegaly Extremities: No edema.  Full range of motion of lower extremities. Neurologic: Alert and oriented x3.  No focal neurological deficits.     Lab Results:  Data Reviewed: I have personally reviewed following labs and imaging studies  CBC: Recent Labs  Lab 04/18/20 0338 04/19/20 0706 04/20/20 0640 04/21/20 0510 04/22/20 0544  WBC 6.4 4.2 8.2 10.7* 7.5  NEUTROABS 4.9 2.7 6.5 7.5 5.6  HGB 13.2 12.5 13.1 13.7 13.2  HCT 39.0 38.0 40.1 41.2 39.8  MCV 88.4 89.8 89.9 90.4 89.8  PLT 199 258 321 326 604    Basic Metabolic Panel: Recent Labs  Lab 04/18/20 0338 04/18/20 0717 04/19/20 0706 04/20/20 0611 04/21/20 0510 04/22/20 0544  NA 142  --  137 137 139 138  K 3.3*  --  3.5 3.7 3.2* 4.4  CL 104  --  99 99 100 101  CO2 26  --  26 25 28 27   GLUCOSE 297*  --  340* 324* 246* 362*  BUN 13  --  23* 28* 29* 29*  CREATININE 0.63  --  0.55 0.47 0.51 0.57  CALCIUM 9.4  --  9.2 9.4 9.2 9.0  MG  --  2.3  --   --   --  2.4    GFR: Estimated Creatinine Clearance: 88.6 mL/min (by C-G formula based on SCr of 0.57 mg/dL).  Liver Function Tests: Recent Labs  Lab 04/18/20 0338 04/19/20 0706 04/20/20 0611 04/21/20 0510 04/22/20 0544  AST 75* 56* 50* 28 20  ALT 69* 66* 66* 61* 46*  ALKPHOS 257* 216* 183* 174* 167*  BILITOT 0.9 0.9 0.9 0.8 0.8  PROT 8.1 7.7 7.3 7.3 6.8  ALBUMIN 3.7 3.4* 3.4* 3.5 3.2*     CBG: Recent Labs  Lab 04/21/20 0750 04/21/20 1215 04/21/20 1610 04/21/20 1947 04/22/20 0731  GLUCAP 207* 149* 174* 176* 310*      Recent Results (from the past 240 hour(s))  Resp Panel by RT PCR (RSV, Flu A&B, Covid) - Nasopharyngeal Swab     Status: Abnormal   Collection Time: 04/18/20  3:38 AM   Specimen: Nasopharyngeal Swab  Result Value Ref Range Status   SARS Coronavirus 2 by RT PCR POSITIVE (A) NEGATIVE Final    Comment: RESULT CALLED TO, READ BACK BY AND VERIFIED WITH: PAIGE JOHNSON AT 0619 ON 04/18/2020 [place]. (NOTE) SARS-CoV-2 target nucleic acids are DETECTED.  SARS-CoV-2 RNA is generally detectable in upper respiratory specimens  during the acute phase of infection. Positive results are indicative of the presence of the identified virus, but do not rule out bacterial infection or co-infection with other pathogens not detected by the test. Clinical correlation with patient history and other diagnostic information is necessary to determine patient infection status. The expected result is Negative.  Fact Sheet for Patients:  PinkCheek.be  Fact Sheet for Healthcare Providers: GravelBags.it  This test is not yet approved or cleared by the [place] FDA and  has been authorized for detection and/or diagnosis of SARS-CoV-2 by FDA under an Emergency Use Authorization (EUA).  This EUA will remain in effect (meaning this test c an be used) for the duration of  the COVID-19 declaration under Section 564(b)(1) of the Act, 21 U.S.C. section 360bbb-3(b)(1), unless the authorization is terminated or revoked sooner.      Influenza A by PCR NEGATIVE NEGATIVE Final   Influenza B by PCR NEGATIVE NEGATIVE Final    Comment: (NOTE) The Xpert Xpress SARS-CoV-2/FLU/RSV assay is intended as an aid in  the diagnosis of influenza from Nasopharyngeal swab specimens and  should not be used as a sole basis for treatment. Nasal washings and  aspirates are unacceptable for Xpert Xpress SARS-CoV-2/FLU/RSV  testing.  Fact Sheet for  Patients: PinkCheek.be  Fact Sheet for Healthcare Providers: GravelBags.it  This test is not yet approved or cleared by the [place] FDA and  has been authorized for detection and/or diagnosis of SARS-CoV-2 by  FDA under an Emergency Use Authorization (EUA). This EUA will remain  in effect (meaning this test can be used) for the duration of the  Covid-19 declaration under Section 564(b)(1) of the Act, 21  U.S.C. section 360bbb-3(b)(1), unless the authorization is  terminated or revoked.    Respiratory Syncytial Virus by PCR NEGATIVE NEGATIVE Final    Comment: (NOTE) Fact Sheet for Patients: PinkCheek.be  Fact Sheet for Healthcare Providers: GravelBags.it  This test is not yet approved or cleared by the [place] FDA and  has been authorized for detection and/or diagnosis of SARS-CoV-2 by  FDA under an Emergency Use Authorization (EUA). This EUA will remain  in effect (meaning this test can be used) for the duration of the  COVID-19 declaration under Section 564(b)(1) of the Act, 21 U.S.C.  section 360bbb-3(b)(1), unless the authorization is terminated or  revoked. Performed at [facility], [address] [place], [place] [address]       Radiology Studies: [facility] [place] 1 View  Result Date: 04/21/2020 CLINICAL DATA:  Cough.  Shortness of breath.  COVID. EXAM: PORTABLE CHEST 1 VIEW COMPARISON:  CT 04/18/2020.  Chest x-ray 11/12/2019. FINDINGS: Heart size stable. Low lung volumes. Diffuse bilateral pulmonary infiltrates, progressive from prior study. Findings consistent with progressive COVID pneumonia. No acute bony abnormality. IMPRESSION: Diffuse bilateral pulmonary infiltrates, progressive from prior study. Findings consistent with progressive COVID pneumonia. Electronically Signed   By: Marcello Moores  Register   On: 04/21/2020 12:49       LOS: 4  days   [facility] Pager on [email or web address]  04/22/2020, 11:06 AM

## 2020-04-23 LAB — COMPREHENSIVE METABOLIC PANEL
ALT: 36 U/L (ref 0–44)
AST: 18 U/L (ref 15–41)
Albumin: 3.2 g/dL — ABNORMAL LOW (ref 3.5–5.0)
Alkaline Phosphatase: 145 U/L — ABNORMAL HIGH (ref 38–126)
Anion gap: 9 (ref 5–15)
BUN: 29 mg/dL — ABNORMAL HIGH (ref 6–20)
CO2: 28 mmol/L (ref 22–32)
Calcium: 8.9 mg/dL (ref 8.9–10.3)
Chloride: 104 mmol/L (ref 98–111)
Creatinine, Ser: 0.48 mg/dL (ref 0.44–1.00)
GFR, Estimated: >60 mL/min (ref >60)
Glucose, Bld: 113 mg/dL — ABNORMAL HIGH (ref 70–99)
Potassium: 3.7 mmol/L (ref 3.5–5.1)
Sodium: 141 mmol/L (ref 135–145)
Total Bilirubin: 0.7 mg/dL (ref 0.3–1.2)
Total Protein: 6.6 g/dL (ref 6.5–8.1)

## 2020-04-23 LAB — GLUCOSE, CAPILLARY
Glucose-Capillary: 216 mg/dL — ABNORMAL HIGH (ref 70–99)
Glucose-Capillary: 289 mg/dL — ABNORMAL HIGH (ref 70–99)

## 2020-04-23 MED ORDER — BENZONATATE 100 MG PO CAPS: 100.0000 mg | ORAL_CAPSULE | Freq: Three times a day (TID) | ORAL | 0 refills | Status: DC | PRN

## 2020-04-23 MED ORDER — ALBUTEROL SULFATE HFA 108 (90 BASE) MCG/ACT IN AERS: 2.0000 | INHALATION_SPRAY | RESPIRATORY_TRACT | 0 refills | Status: DC | PRN

## 2020-04-23 MED ORDER — METFORMIN HCL 500 MG PO TABS: 500.0000 mg | ORAL_TABLET | Freq: Two times a day (BID) | ORAL | 1 refills | Status: DC

## 2020-04-23 MED ORDER — POLYETHYLENE GLYCOL 3350 17 G PO PACK: 17.0000 g | PACK | Freq: Every day | ORAL | 0 refills | Status: DC | PRN

## 2020-04-23 MED ORDER — POLYETHYLENE GLYCOL 3350 17 G PO PACK
17.0000 g | PACK | Freq: Every day | ORAL | Status: DC
  Administered 2020-04-23: 10:00:00 17 g via ORAL
  Filled 2020-04-23: qty 1

## 2020-04-23 MED ORDER — PREDNISONE 20 MG PO TABS: ORAL_TABLET | ORAL | 0 refills | Status: DC

## 2020-04-23 NOTE — Plan of Care (Signed)
  Problem: Education: Goal: Knowledge of risk factors and measures for prevention of condition will improve Outcome: Adequate for Discharge   Problem: Respiratory: Goal: Will maintain a patent airway Outcome: Adequate for Discharge   Problem: Education: Goal: Knowledge of General Education information will improve Description: Including pain rating scale, medication(s)/side effects and non-pharmacologic comfort measures Outcome: Adequate for Discharge   Problem: Health Behavior/Discharge Planning: Goal: Ability to manage health-related needs will improve Outcome: Adequate for Discharge   Problem: Clinical Measurements: Goal: Ability to maintain clinical measurements within normal limits will improve Outcome: Adequate for Discharge Goal: Diagnostic test results will improve Outcome: Adequate for Discharge Goal: Respiratory complications will improve Outcome: Adequate for Discharge   Problem: Nutrition: Goal: Adequate nutrition will be maintained Outcome: Adequate for Discharge

## 2020-04-23 NOTE — TOC Transition Note (Addendum)
Transition of Care [facility]) - CM/SW Discharge Note   Patient Details  Name: Kristen Sparks MRN: [id number] Date of Birth: [birth year]  Transition of [facility]) CM/SW Contact:  Izola Price, RN Phone Number: 04/23/2020, 1:34 PM   Clinical Narrative:   [place] PT orders with RW and possible Oxygen needs per PT eval. 04/23/20. Contacted RN and provider per PT ambulatory saturation notes and to get DME RW ordered for discharge. RN stated patient had already left the hospital. Provider ordered DME but not home oxygen. Checked to make sure he saw last PT ambulation notes. Will contact patient and attempt to arrange [facility] and DME delivery. Simmie Davies RN CM   Contacted Teresa at [facility] for [facility] referral--pending.  Contacted Jermaine at [facility] for RW/DME-pending  Pt. Lives in [place], [place]. Contacted patient by mobile phone while was being driven home. No preference on HH. Has a cane at home. Driven by husband. Explained HH/DME situation and that someone would contact her. Simmie Davies RN CM      Final next level of care: Home w Home Health Services Barriers to Discharge: Barriers Resolved   Patient Goals and CMS Choice        Discharge Placement                       Discharge Plan and Services                                     Social Determinants of Health (SDOH) Interventions     Readmission Risk Interventions No flowsheet data found.

## 2020-04-23 NOTE — Discharge Summary (Signed)
[facility]  Physician Discharge Summary   Patient ID: Kristen Sparks MRN: [id number] DOB/AGE: [birth year] 58 y.o.  Admit date: 04/18/2020 Discharge date: 04/23/2020  PCP: Lavera Guise, MD  DISCHARGE DIAGNOSES:  Pneumonia due to COVID-19 Acute respiratory failure with hypoxia, resolved Transaminitis Diabetes mellitus type 2, newly diagnosed History of GERD History of anxiety disorder  RECOMMENDATIONS FOR OUTPATIENT FOLLOW UP: 1. Patient to follow-up with PCP in 1 to 2 weeks 2. Needs further management of diabetes including consideration of statin   Home Health: Home health PT ordered Equipment/Devices: Rolling walker  CODE STATUS: Full code  DISCHARGE CONDITION: fair  Diet recommendation: Modified carbohydrate  INITIAL HISTORY: 59 y.o.femalewith medical history significant ofhypertension, hyperlipidemia, diabetes mellitus, asthma, GERD, depression, anxiety, rheumatoid arthritis, borderline OSA, obesity, who presented with cough, shortness breath.  Patient diagnosed with pneumonia due to COVID-19.  Hospitalized for further management.   HOSPITAL COURSE:   Acute Hypoxic Resp. Failure/Pneumonia due to COVID-19 Patient was hospitalized and placed on Remdesivir and steroids.  Patient was requiring about 2 to 3 L of oxygen initially.  Inflammatory markers were noted to be elevated.  D-dimer was only minimally elevated.  Patient started improving.  She was also given diuretics.  Oxygenation has significantly improved.  She was ambulated on room air today and was noted to be saturating greater than 90%.  Patient was feeling better without any shortness of breath.  Seen by physical therapy yesterday.  Home health has been ordered.  Not noted to have any oxygen requirements today.  Will be discharged on tapering doses of prednisone.  Abdominal pain Likely due to coughing spells.  X-ray was unremarkable.  Seems to be better.  Hypokalemia Repleted.  Magnesium  2.4.  Transaminitis Secondary to COVID-19.    Essential hypertension Continue Lotensin.    Hyperglycemia secondary to steroids/diabetes mellitus type 2, uncontrolled HbA1c 6.9 earlier this month.  Elevated glucose levels due to steroids.  She likely does have underlying diabetes.  She was placed on Levemir here in the hospital due to high-dose steroids.  Will be discharged on Metformin.  She needs to follow-up with her PCP for further management including consideration of statin.   History of GERD  Anxiety disorder Continue with home medications.    Obesity Estimated body mass index is 40.74 kg/m as calculated from the following:   Height as of this encounter: 5\' 3"  (1.6 m).   Weight as of this encounter: 104.3 kg.  Overall patient remained stable.  Okay for discharge home today.   PERTINENT LABS:  The results of significant diagnostics from this hospitalization (including imaging, microbiology, ancillary and laboratory) are listed below for reference.    Microbiology: Recent Results (from the past 240 hour(s))  Resp Panel by RT PCR (RSV, Flu A&B, Covid) - Nasopharyngeal Swab     Status: Abnormal   Collection Time: 04/18/20  3:38 AM   Specimen: Nasopharyngeal Swab  Result Value Ref Range Status   SARS Coronavirus 2 by RT PCR POSITIVE (A) NEGATIVE Final    Comment: RESULT CALLED TO, READ BACK BY AND VERIFIED WITH: PAIGE JOHNSON AT 0619 ON 04/18/2020 [place]. (NOTE) SARS-CoV-2 target nucleic acids are DETECTED.  SARS-CoV-2 RNA is generally detectable in upper respiratory specimens  during the acute phase of infection. Positive results are indicative of the presence of the identified virus, but do not rule out bacterial infection or co-infection with other pathogens not detected by the test. Clinical correlation with patient history and other diagnostic information is necessary  to determine patient infection status. The expected result is Negative.  Fact Sheet for  Patients:  PinkCheek.be  Fact Sheet for Healthcare Providers: GravelBags.it  This test is not yet approved or cleared by the [place] FDA and  has been authorized for detection and/or diagnosis of SARS-CoV-2 by FDA under an Emergency Use Authorization (EUA).  This EUA will remain in effect (meaning this test c an be used) for the duration of  the COVID-19 declaration under Section 564(b)(1) of the Act, 21 U.S.C. section 360bbb-3(b)(1), unless the authorization is terminated or revoked sooner.      Influenza A by PCR NEGATIVE NEGATIVE Final   Influenza B by PCR NEGATIVE NEGATIVE Final    Comment: (NOTE) The Xpert Xpress SARS-CoV-2/FLU/RSV assay is intended as an aid in  the diagnosis of influenza from Nasopharyngeal swab specimens and  should not be used as a sole basis for treatment. Nasal washings and  aspirates are unacceptable for Xpert Xpress SARS-CoV-2/FLU/RSV  testing.  Fact Sheet for Patients: PinkCheek.be  Fact Sheet for Healthcare Providers: GravelBags.it  This test is not yet approved or cleared by the [place] FDA and  has been authorized for detection and/or diagnosis of SARS-CoV-2 by  FDA under an Emergency Use Authorization (EUA). This EUA will remain  in effect (meaning this test can be used) for the duration of the  Covid-19 declaration under Section 564(b)(1) of the Act, 21  U.S.C. section 360bbb-3(b)(1), unless the authorization is  terminated or revoked.    Respiratory Syncytial Virus by PCR NEGATIVE NEGATIVE Final    Comment: (NOTE) Fact Sheet for Patients: PinkCheek.be  Fact Sheet for Healthcare Providers: GravelBags.it  This test is not yet approved or cleared by the [place] FDA and  has been authorized for detection and/or diagnosis of SARS-CoV-2 by  FDA under  an Emergency Use Authorization (EUA). This EUA will remain  in effect (meaning this test can be used) for the duration of the  COVID-19 declaration under Section 564(b)(1) of the Act, 21 U.S.C.  section 360bbb-3(b)(1), unless the authorization is terminated or  revoked. Performed at [facility], [address] [place], [place] [address]      Labs:  COVID-19 Labs  Recent Labs    04/21/20 0510 04/22/20 0544  CRP 1.0* 2.7*    Lab Results  Component Value Date   SARSCOV2NAA POSITIVE (A) 04/18/2020   [place] Not Detected 04/07/2019      Basic Metabolic Panel: Recent Labs  Lab 04/18/20 0338 04/18/20 0717 04/19/20 0706 04/20/20 0611 04/21/20 0510 04/22/20 0544 04/23/20 0546  NA   < >  --  137 137 139 138 141  K   < >  --  3.5 3.7 3.2* 4.4 3.7  CL   < >  --  99 99 100 101 104  CO2   < >  --  26 25 28 27 28   GLUCOSE   < >  --  340* 324* 246* 362* 113*  BUN   < >  --  23* 28* 29* 29* 29*  CREATININE   < >  --  0.55 0.47 0.51 0.57 0.48  CALCIUM   < >  --  9.2 9.4 9.2 9.0 8.9  MG  --  2.3  --   --   --  2.4  --    < > = values in this interval not displayed.   Liver Function Tests: Recent Labs  Lab 04/19/20 2426 04/20/20 8341 04/21/20 0510 04/22/20 0544  04/23/20 0546  AST 56* 50* 28 20 18   ALT 66* 66* 61* 46* 36  ALKPHOS 216* 183* 174* 167* 145*  BILITOT 0.9 0.9 0.8 0.8 0.7  PROT 7.7 7.3 7.3 6.8 6.6  ALBUMIN 3.4* 3.4* 3.5 3.2* 3.2*   CBC: Recent Labs  Lab 04/18/20 0338 04/19/20 0706 04/20/20 0640 04/21/20 0510 04/22/20 0544  WBC 6.4 4.2 8.2 10.7* 7.5  NEUTROABS 4.9 2.7 6.5 7.5 5.6  HGB 13.2 12.5 13.1 13.7 13.2  HCT 39.0 38.0 40.1 41.2 39.8  MCV 88.4 89.8 89.9 90.4 89.8  PLT 199 258 321 326 303   BNP: BNP (last 3 results) Recent Labs    04/18/20 1700  BNP 85.5    CBG: Recent Labs  Lab 04/22/20 1151 04/22/20 1716 04/22/20 2046 04/23/20 0817 04/23/20 1211  GLUCAP 278* 352* 193* 216* 289*     IMAGING STUDIES CT Angio  Chest PE W and/or Wo Contrast  Result Date: 04/18/2020 CLINICAL DATA:  Worsening shortness of breath.  PE suspected. EXAM: CT ANGIOGRAPHY CHEST WITH CONTRAST TECHNIQUE: Multidetector CT imaging of the chest was performed using the standard protocol during bolus administration of intravenous contrast. Multiplanar CT image reconstructions and MIPs were obtained to evaluate the vascular anatomy. CONTRAST:  147mL OMNIPAQUE IOHEXOL 350 MG/ML SOLN COMPARISON:  None. FINDINGS: Cardiovascular: The heart size is normal. No substantial pericardial effusion. No thoracic aortic aneurysm. Assessment of pulmonary arteries markedly degraded by breathing motion during image acquisition. There is no large central pulmonary embolus. Segmental and subsegmental pulmonary arteries show no definite filling defects although assessment in the lower lobes likely unreliable due to the substantial motion artifact. Mediastinum/Nodes: No mediastinal lymphadenopathy. There is no hilar lymphadenopathy. The esophagus has normal imaging features. There is no axillary lymphadenopathy. Lungs/Pleura: Bilateral patchy ill-defined ground-glass opacities are noted in all lobes of both lungs. No pneumothorax or pleural effusion. Upper Abdomen: Visualized portion of the adrenal glands shows thickening without definite nodule or mass. Musculoskeletal: No worrisome lytic or sclerotic osseous abnormality. Review of the MIP images confirms the above findings. IMPRESSION: 1. Markedly motion degraded study. No large central pulmonary embolus. Segmental and subsegmental pulmonary arteries show no definite filling defects although assessment in the lower lobes likely unreliable due to the substantial motion artifact. 2. Bilateral patchy ill-defined ground-glass opacities in all lobes of both lungs. Imaging features compatible with multifocal pneumonia in this patient with known COVID infection. Electronically Signed   By: Misty Stanley M.D.   On: 04/18/2020  06:28   DG Chest Port 1 View  Result Date: 04/21/2020 CLINICAL DATA:  Cough.  Shortness of breath.  COVID. EXAM: PORTABLE CHEST 1 VIEW COMPARISON:  CT 04/18/2020.  Chest x-ray 11/12/2019. FINDINGS: Heart size stable. Low lung volumes. Diffuse bilateral pulmonary infiltrates, progressive from prior study. Findings consistent with progressive COVID pneumonia. No acute bony abnormality. IMPRESSION: Diffuse bilateral pulmonary infiltrates, progressive from prior study. Findings consistent with progressive COVID pneumonia. Electronically Signed   By: [place]   On: 04/21/2020 12:49   DG Chest Portable 1 View  Result Date: 04/18/2020 CLINICAL DATA:  Shortness of breath. COVID positive 1 week ago. Productive cough. EXAM: PORTABLE CHEST 1 VIEW COMPARISON:  Radiograph 04/16/2020 FINDINGS: Progression in heterogeneous bilateral lung opacities typical of COVID pneumonia. Upper normal heart size with unchanged mediastinal contours. No evidence of pneumomediastinum. No visualized pneumothorax. No large pleural effusion. No acute osseous abnormalities are seen. IMPRESSION: Progression in heterogeneous bilateral lung opacities over the past 2 days typical of COVID pneumonia. Electronically  Signed   By: Keith Rake M.D.   On: 04/18/2020 03:53   DG Abd Portable 1V  Result Date: 04/19/2020 CLINICAL DATA:  Stomach ache EXAM: PORTABLE ABDOMEN - 1 VIEW COMPARISON:  Portable exam 1349 hours without priors for comparison FINDINGS: Normal bowel gas pattern. No bowel dilatation or bowel wall thickening. Surgical clips RIGHT upper quadrant question cholecystectomy. Osseous structures unremarkable. No urinary tract calcification. IMPRESSION: Normal exam. Electronically Signed   By: Lavonia Dana M.D.   On: 04/19/2020 15:23    DISCHARGE EXAMINATION: Vitals:   04/23/20 0612 04/23/20 0816 04/23/20 1000 04/23/20 1030  BP: 126/62 (!) 131/91    Pulse: 68 89    Resp: 20 18    Temp: 98 F (36.7 C) 98.2 F (36.8 C)     TempSrc:  Oral    SpO2: 92% 91% 97% 94%  Weight:      Height:       General appearance: Awake alert.  In no distress Resp: Normal effort at rest.  Improved aeration bilaterally.  Few crackles at the bases.  No wheezing or rhonchi.  Cardio: S1-S2 is normal regular.  No S3-S4.  No rubs murmurs or bruit GI: Abdomen is soft.  Nontender nondistended.  Bowel sounds are present normal.  No masses organomegaly    DISPOSITION: Home  Discharge Instructions    Call MD for:  difficulty breathing, headache or visual disturbances   Complete by: As directed    Call MD for:  extreme fatigue   Complete by: As directed    Call MD for:  persistant dizziness or light-headedness   Complete by: As directed    Call MD for:  persistant nausea and vomiting   Complete by: As directed    Call MD for:  severe uncontrolled pain   Complete by: As directed    Call MD for:  temperature >100.4   Complete by: As directed    Diet Carb Modified   Complete by: As directed    Discharge instructions   Complete by: As directed    Please take your medications as prescribed.  You will need to remain isolated till 11/14. Please note that your glucose levels have been elevated which is most likely due to steroids.  However your HbA1c was noted to be 6.9 which is consistent with diabetes.  You will need to discuss this further with your PCP.  We will discharge you on Metformin.  COVID 19 INSTRUCTIONS  - You are felt to be stable enough to no longer require inpatient monitoring, testing, and treatment, though you will need to follow the recommendations below: - Based on the CDC's non-test criteria for ending self-isolation: You may not return to work/leave the home until at least 20 days since symptom onset AND 24 hours without a fever (without taking tylenol, ibuprofen, etc.) AND have improvement in respiratory symptoms. - Do not take NSAID medications (including, but not limited to, ibuprofen, advil, motrin, naproxen,  aleve, goody's powder, etc.) - Follow up with your doctor in the next week via telehealth or seek medical attention right away if your symptoms get WORSE.    Directions for you at home:  Wear a facemask You should wear a facemask that covers your nose and mouth when you are in the same room with other people and when you visit a healthcare provider. People who live with or visit you should also wear a facemask while they are in the same room with you.  Separate yourself from other  people in your home As much as possible, you should stay in a different room from other people in your home. Also, you should use a separate bathroom, if available.  Avoid sharing household items You should not share dishes, drinking glasses, cups, eating utensils, towels, bedding, or other items with other people in your home. After using these items, you should wash them thoroughly with soap and water.  Cover your coughs and sneezes Cover your mouth and nose with a tissue when you cough or sneeze, or you can cough or sneeze into your sleeve. Throw used tissues in a lined trash can, and immediately wash your hands with soap and water for at least 20 seconds or use an alcohol-based hand rub.  Wash your [facility] your hands often and thoroughly with soap and water for at least 20 seconds. You can use an alcohol-based hand sanitizer if soap and water are not available and if your hands are not visibly dirty. Avoid touching your eyes, nose, and mouth with unwashed hands.  Directions for those who live with, or provide care at home for you:  Limit the number of people who have contact with the patient If possible, have only one caregiver for the patient. Other household members should stay in another home or place of residence. If this is not possible, they should stay in another room, or be separated from the patient as much as possible. Use a separate bathroom, if available. Restrict visitors who do not  have an essential need to be in the home.  Ensure good ventilation Make sure that shared spaces in the home have good air flow, such as from an air conditioner or an opened window, weather permitting.  Wash your hands often Wash your hands often and thoroughly with soap and water for at least 20 seconds. You can use an alcohol based hand sanitizer if soap and water are not available and if your hands are not visibly dirty. Avoid touching your eyes, nose, and mouth with unwashed hands. Use disposable paper towels to dry your hands. If not available, use dedicated cloth towels and replace them when they become wet.  Wear a facemask and gloves Wear a disposable facemask at all times in the room and gloves when you touch or have contact with the patient's blood, body fluids, and/or secretions or excretions, such as sweat, saliva, sputum, nasal mucus, vomit, urine, or feces.  Ensure the mask fits over your nose and mouth tightly, and do not touch it during use. Throw out disposable facemasks and gloves after using them. Do not reuse. Wash your hands immediately after removing your facemask and gloves. If your personal clothing becomes contaminated, carefully remove clothing and launder. Wash your hands after handling contaminated clothing. Place all used disposable facemasks, gloves, and other waste in a lined container before disposing them with other household waste. Remove gloves and wash your hands immediately after handling these items.  Do not share dishes, glasses, or other household items with the patient Avoid sharing household items. You should not share dishes, drinking glasses, cups, eating utensils, towels, bedding, or other items with a patient who is confirmed to have, or being evaluated for, COVID-19 infection. After the person uses these items, you should wash them thoroughly with soap and water.  Wash laundry thoroughly Immediately remove and wash clothes or bedding that have  blood, body fluids, and/or secretions or excretions, such as sweat, saliva, sputum, nasal mucus, vomit, urine, or feces, on them. Wear gloves when  handling laundry from the patient. Read and follow directions on labels of laundry or clothing items and detergent. In general, wash and dry with the warmest temperatures recommended on the label.  Clean all areas the individual has used often Clean all touchable surfaces, such as counters, tabletops, doorknobs, bathroom fixtures, toilets, phones, keyboards, tablets, and bedside tables, every day. Also, clean any surfaces that may have blood, body fluids, and/or secretions or excretions on them. Wear gloves when cleaning surfaces the patient has come in contact with. Use a diluted bleach solution (e.g., dilute bleach with 1 part bleach and 10 parts water) or a household disinfectant with a label that says EPA-registered for coronaviruses. To make a bleach solution at home, add 1 tablespoon of bleach to 1 quart (4 cups) of water. For a larger supply, add  cup of bleach to 1 gallon (16 cups) of water. Read labels of cleaning products and follow recommendations provided on product labels. Labels contain instructions for safe and effective use of the cleaning product including precautions you should take when applying the product, such as wearing gloves or eye protection and making sure you have good ventilation during use of the product. Remove gloves and wash hands immediately after cleaning.  Monitor yourself for signs and symptoms of illness Caregivers and household members are considered close contacts, should monitor their health, and will be asked to limit movement outside of the home to the extent possible. Follow the monitoring steps for close contacts listed on the symptom monitoring form.   If you have additional questions, contact your local health department or call the epidemiologist on call at [phone] (available 24/7). This guidance is  subject to change. For the most up-to-date guidance from [facility], please refer to their website: YouBlogs.pl   You were cared for by a hospitalist during your hospital stay. If you have any questions about your discharge medications or the care you received while you were in the hospital after you are discharged, you can call the unit and asked to speak with the hospitalist on call if the hospitalist that took care of you is not available. Once you are discharged, your primary care physician will handle any further medical issues. Please note that NO REFILLS for any discharge medications will be authorized once you are discharged, as it is imperative that you return to your primary care physician (or establish a relationship with a primary care physician if you do not have one) for your aftercare needs so that they can reassess your need for medications and monitor your lab values. If you do not have a primary care physician, you can call [phone] for a physician referral.   Increase activity slowly   Complete by: As directed         Allergies as of 04/23/2020      Reactions   Azithromycin Hives   Contrast Media [iodinated Diagnostic Agents]       Medication List    STOP taking these medications   ciprofloxacin-dexamethasone OTIC suspension Commonly known as: Ciprodex   nitrofurantoin (macrocrystal-monohydrate) 100 MG capsule Commonly known as: Macrobid   predniSONE 10 MG (21) Tbpk tablet Commonly known as: STERAPRED UNI-PAK 21 TAB Replaced by: predniSONE 20 MG tablet     TAKE these medications   albuterol 108 (90 Base) MCG/ACT inhaler Commonly known as: VENTOLIN HFA Inhale 2 puffs into the lungs every 4 (four) hours as needed for wheezing or shortness of breath.   ALPRAZolam 0.5 MG tablet Commonly known as: Duanne Moron  Take 1/2 t o1 tablet po QD prn What changed:   how much to take  how to take this  when to take  this  reasons to take this  additional instructions   benazepril 10 MG tablet Commonly known as: LOTENSIN Take 10 mg by mouth daily.   benzonatate 100 MG capsule Commonly known as: TESSALON Take 1 capsule (100 mg total) by mouth 3 (three) times daily as needed for cough.   metFORMIN 500 MG tablet Commonly known as: GLUCOPHAGE Take 1 tablet (500 mg total) by mouth 2 (two) times daily with a meal.   omeprazole 40 MG capsule Commonly known as: PRILOSEC Take 1 capsule (40 mg total) by mouth 2 (two) times daily.   polyethylene glycol 17 g packet Commonly known as: MIRALAX / GLYCOLAX Take 17 g by mouth daily as needed for mild constipation.   predniSONE 20 MG tablet Commonly known as: DELTASONE Take 3 tablets once daily for 3 days followed by 2 tablets once daily for 3 days followed by 1 tablet once daily for 3 days and then stop Replaces: predniSONE 10 MG (21) Tbpk tablet   sertraline 100 MG tablet Commonly known as: ZOLOFT Take 1 tablet (100 mg total) by mouth daily.   traMADol 50 MG tablet Commonly known as: ULTRAM TAKE 1 TABLET BY MOUTH TWICE DAILY AS NEEDED FOR SEVERE PAIN What changed:   how much to take  how to take this  when to take this  additional instructions            Durable Medical Equipment  (From admission, onward)         Start     Ordered   04/23/20 1325  For home use only DME Walker rolling  Once       Question Answer Comment  Walker: With [place]   Patient needs a walker to treat with the following condition Physical deconditioning      04/23/20 1324            Follow-up Information    Lavera Guise, MD. Schedule an appointment as soon as possible for a visit in 1 week.   Specialty: Internal Medicine Why: Patient to make own follow up appt. Office closed at this time. Contact information: [address] [place] [address] [phone]               TOTAL DISCHARGE TIME: 35 minutes  [facility] Pager on [email or web address]  04/23/2020, 6:11 PM

## 2020-04-23 NOTE — Progress Notes (Signed)
Pt ambulated in room with this RN on RA for 5 min, saturations remained above 94% on RA. Pt reports feeling much better and having better energy. Sats documented in flowsheet and MD notified. MD stated he would DC home o2 orders.

## 2020-05-05 ENCOUNTER — Other Ambulatory Visit: Payer: Self-pay | Admitting: Internal Medicine

## 2020-05-05 DIAGNOSIS — M1991 Primary osteoarthritis, unspecified site: Secondary | ICD-10-CM

## 2020-05-06 ENCOUNTER — Other Ambulatory Visit: Payer: Self-pay

## 2020-05-06 DIAGNOSIS — F411 Generalized anxiety disorder: Secondary | ICD-10-CM

## 2020-05-06 MED ORDER — SERTRALINE HCL 100 MG PO TABS: 100.0000 mg | ORAL_TABLET | Freq: Every day | ORAL | 0 refills | Status: DC

## 2020-05-09 ENCOUNTER — Ambulatory Visit: Payer: BC Managed Care – PPO | Admitting: Internal Medicine

## 2020-05-09 ENCOUNTER — Other Ambulatory Visit: Payer: Self-pay

## 2020-05-09 ENCOUNTER — Encounter: Payer: Self-pay | Admitting: Hospice and Palliative Medicine

## 2020-05-09 VITALS — BP 142/88 | HR 93 | Temp 97.8°F | Resp 16 | Ht 63.0 in | Wt 215.2 lb

## 2020-05-09 DIAGNOSIS — E1165 Type 2 diabetes mellitus with hyperglycemia: Secondary | ICD-10-CM | POA: Diagnosis not present

## 2020-05-09 DIAGNOSIS — E119 Type 2 diabetes mellitus without complications: Secondary | ICD-10-CM | POA: Diagnosis not present

## 2020-05-09 DIAGNOSIS — R0602 Shortness of breath: Secondary | ICD-10-CM | POA: Diagnosis not present

## 2020-05-09 DIAGNOSIS — Z79899 Other long term (current) drug therapy: Secondary | ICD-10-CM

## 2020-05-09 DIAGNOSIS — U071 COVID-19: Secondary | ICD-10-CM | POA: Diagnosis not present

## 2020-05-09 DIAGNOSIS — J1282 Pneumonia due to coronavirus disease 2019: Secondary | ICD-10-CM

## 2020-05-09 DIAGNOSIS — R300 Dysuria: Secondary | ICD-10-CM

## 2020-05-09 LAB — POCT URINALYSIS DIPSTICK
Bilirubin, UA: NEGATIVE
Glucose, UA: NEGATIVE
Nitrite, UA: NEGATIVE
Protein, UA: POSITIVE — AB
Spec Grav, UA: >=1.03 — AB (ref 1.010–1.025)
Urobilinogen, UA: 0.2 E.U./dL
pH, UA: 5 (ref 5.0–8.0)

## 2020-05-09 LAB — POCT URINE DRUG SCREEN
Methylenedioxyamphetamine: NOT DETECTED
POC Amphetamine UR: NOT DETECTED
POC BENZODIAZEPINES UR: NOT DETECTED
POC Barbiturate UR: NOT DETECTED
POC Cocaine UR: NOT DETECTED
POC Ecstasy UR: NOT DETECTED
POC Marijuana UR: NOT DETECTED
POC Methadone UR: NOT DETECTED
POC Methamphetamine UR: NOT DETECTED
POC Opiate Ur: NOT DETECTED
POC Oxycodone UR: NOT DETECTED
POC PHENCYCLIDINE UR: NOT DETECTED
POC TRICYCLICS UR: NOT DETECTED

## 2020-05-09 LAB — POCT GLYCOSYLATED HEMOGLOBIN (HGB A1C): Hemoglobin A1C: 7.8 % — AB (ref 4.0–5.6)

## 2020-05-09 LAB — POCT CBG (FASTING - GLUCOSE)-MANUAL ENTRY: Glucose Fasting, POC: 198 mg/dL — AB (ref 70–99)

## 2020-05-09 NOTE — Progress Notes (Signed)
[facility] [place], [place] [address]  Internal MEDICINE  Office Visit Note  Patient Name: Kristen Sparks  [id number]  [id number]  Date of Service: 05/10/2020     Chief Complaint  Patient presents with  . Follow-up    pt had covid in october, pt has lingering effects: urine has a smell, no pain, left side of jaw/ear hurts, ears are itchy, tounge is hot and irritating, glands swollen, pt wants lungs checked  . Anxiety  . Asthma  . Depression  . Hyperlipidemia  . Hypertension  . Sleep Apnea  . Diabetes  . policy update form    received     HPI Pt is here for recent hospital follow up. She was hospitalized for Covid pneumonia. Was treated with Remdesivir, steroids  And azithromycin, pt also required O2 therapy, was discharged home on tapering dose of steroids after 5 days. She feels better but tired with residual sob. Blood glucose has been elevated as well. Pt has been on metformin in the past. Blood pressure is elevated today, chronic back pain and arthritic symptoms responding to ultram  C/o difficulty urinating at time, has odor as well, denies abdominal pain  Current Medication: Outpatient Encounter Medications as of 05/09/2020  Medication Sig  . albuterol (VENTOLIN HFA) 108 (90 Base) MCG/ACT inhaler Inhale 2 puffs into the lungs every 4 (four) hours as needed for wheezing or shortness of breath.  . ALPRAZolam (XANAX) 0.5 MG tablet Take 1/2 t o1 tablet po QD prn (Patient taking differently: Take 0.5 mg by mouth daily as needed for anxiety. )  . benazepril (LOTENSIN) 10 MG tablet Take 10 mg by mouth daily.  . benzonatate (TESSALON) 100 MG capsule Take 1 capsule (100 mg total) by mouth 3 (three) times daily as needed for cough.  . metFORMIN (GLUCOPHAGE) 500 MG tablet Take 1 tablet (500 mg total) by mouth 2 (two) times daily with a meal.  . omeprazole (PRILOSEC) 40 MG capsule Take 1 capsule (40 mg total) by mouth 2 (two) times daily.  . polyethylene  glycol (MIRALAX / GLYCOLAX) 17 g packet Take 17 g by mouth daily as needed for mild constipation.  . predniSONE (DELTASONE) 20 MG tablet Take 3 tablets once daily for 3 days followed by 2 tablets once daily for 3 days followed by 1 tablet once daily for 3 days and then stop  . sertraline (ZOLOFT) 100 MG tablet Take 1 tablet (100 mg total) by mouth daily.  . traMADol (ULTRAM) 50 MG tablet TAKE 1 TABLET BY MOUTH TWICE DAILY AS NEEDED FOR SEVERE PAIN (Patient taking differently: Take 50 mg by mouth daily. )   No facility-administered encounter medications on file as of 05/09/2020.    Surgical History: Past Surgical History:  Procedure Laterality Date  . CHOLECYSTECTOMY  2000  . colon polyectomy    . COLONOSCOPY WITH PROPOFOL N/A 03/31/2018   Procedure: COLONOSCOPY WITH PROPOFOL;  Surgeon: Jonathon Bellows, MD;  Location: [facility] ENDOSCOPY;  Service: Gastroenterology;  Laterality: N/A;  . FOOT SURGERY      Medical History: Past Medical History:  Diagnosis Date  . Anxiety   . Asthma   . Depression   . Environmental allergies   . Hyperlipidemia   . Hypertension   . Rheumatoid arthritis ([place])   . Sleep apnea 2019   Borderline  . Type 2 diabetes mellitus (HCC)     Family History: Family History  Problem Relation Age of Onset  . Diabetes Mother   .  COPD Mother   . Uterine cancer Mother 32  . Cancer Sister 37       Bile Duct  . Breast cancer Neg Hx     Social History   Socioeconomic History  . Marital status: Married    Spouse name: Not on file  . Number of children: Not on file  . Years of education: Not on file  . Highest education level: Not on file  Occupational History  . Not on file  Tobacco Use  . Smoking status: Never Smoker  . Smokeless tobacco: Never Used  Vaping Use  . Vaping Use: Never used  Substance and Sexual Activity  . Alcohol use: Yes    Comment: occ.  . Drug use: No  . Sexual activity: Yes    Birth control/protection: Post-menopausal  Other Topics  Concern  . Not on file  Social History Narrative  . Not on file   Social Determinants of Health   Financial Resource Strain:   . Difficulty of Paying Living Expenses: Not on file  Food Insecurity:   . Worried About Charity fundraiser in the Last Year: Not on file  . Ran Out of Food in the Last Year: Not on file  Transportation Needs:   . Lack of Transportation (Medical): Not on file  . Lack of Transportation (Non-Medical): Not on file  Physical Activity:   . Days of Exercise per Week: Not on file  . Minutes of Exercise per Session: Not on file  Stress:   . Feeling of Stress : Not on file  Social Connections:   . Frequency of Communication with Friends and Family: Not on file  . Frequency of Social Gatherings with Friends and Family: Not on file  . Attends Religious Services: Not on file  . Active Member of Clubs or Organizations: Not on file  . Attends Archivist Meetings: Not on file  . Marital Status: Not on file  Intimate Partner Violence:   . Fear of Current or Ex-Partner: Not on file  . Emotionally Abused: Not on file  . Physically Abused: Not on file  . Sexually Abused: Not on file      Review of Systems  Constitutional: Negative for chills, diaphoresis and fatigue.  HENT: Negative for ear pain, postnasal drip and sinus pressure.   Eyes: Negative for photophobia, discharge, redness, itching and visual disturbance.  Respiratory: Positive for cough and shortness of breath. Negative for wheezing.   Cardiovascular: Negative for chest pain, palpitations and leg swelling.  Gastrointestinal: Negative for abdominal pain, constipation, diarrhea, nausea and vomiting.  Endocrine:       Elevated glucose   Genitourinary: Negative for dysuria and flank pain.  Musculoskeletal: Negative for arthralgias, back pain, gait problem and neck pain.  Skin: Negative for color change.  Allergic/Immunologic: Negative for environmental allergies and food allergies.   Neurological: Positive for weakness. Negative for dizziness and headaches.  Hematological: Does not bruise/bleed easily.  Psychiatric/Behavioral: Negative for agitation, behavioral problems (depression) and hallucinations.    Vital Signs: BP (!) 142/88   Pulse 93   Temp 97.8 F (36.6 C)   Resp 16   Ht 5\' 3"  (1.6 m)   Wt 215 lb 3.2 oz (97.6 kg)   SpO2 97%   BMI 38.12 kg/m    Physical Exam Constitutional:      General: She is not in acute distress.    Appearance: She is well-developed. She is not diaphoretic.  HENT:     Head:  Normocephalic and atraumatic.     Mouth/Throat:     Pharynx: No oropharyngeal exudate.  Eyes:     Extraocular Movements: Extraocular movements intact.     Pupils: Pupils are equal, round, and reactive to light.  Neck:     Thyroid: No thyromegaly.     Vascular: No JVD.     Trachea: No tracheal deviation.  Cardiovascular:     Rate and Rhythm: Normal rate and regular rhythm.     Heart sounds: Normal heart sounds. No murmur heard.  No friction rub. No gallop.   Pulmonary:     Effort: Pulmonary effort is normal. No respiratory distress.     Breath sounds: No wheezing or rales.     Comments: Decreased air entry bilateral Chest:     Chest wall: No tenderness.  Abdominal:     General: Bowel sounds are normal.     Palpations: Abdomen is soft.  Musculoskeletal:        General: Normal range of motion.     Cervical back: Normal range of motion and neck supple.  Lymphadenopathy:     Cervical: No cervical adenopathy.  Skin:    General: Skin is warm and dry.  Neurological:     Mental Status: She is alert and oriented to person, place, and time.     Cranial Nerves: No cranial nerve deficit.  Psychiatric:        Behavior: Behavior normal.        Thought Content: Thought content normal.        Judgment: Judgment normal.    Assessment/Plan: 1. Uncontrolled type 2 diabetes mellitus with hyperglycemia (HCC) Worsening hg a1c, dc metformin due to GI side  effects, will start Steglatro 5 mg po qd, increase as needed  - POCT HgB A1C - POCT CBG (Fasting - Glucose)  2. SOB (shortness of breath) Residual post viral fatigue. Will monitor. O2 sats wnl - 6 minute walk; Future  3. Pneumonia due to COVID-19 virus Will need follow up cxr  4. Long-term use of high-risk medication - POCT Urine Drug Screen  5. Dysuria Send urine c/s - POCT Urinalysis Dipstick - CULTURE, URINE COMPREHENSIVE  General Counseling: Aleeha verbalizes understanding of the findings of todays visit and agrees with plan of treatment. I have discussed any further diagnostic evaluation that may be needed or ordered today. We also reviewed her medications today. she has been encouraged to call the office with any questions or concerns that should arise related to todays visit.    Orders Placed This Encounter  Procedures  . CULTURE, URINE COMPREHENSIVE  . POCT Urine Drug Screen  . POCT Urinalysis Dipstick  . POCT HgB A1C  . POCT CBG (Fasting - Glucose)  . 6 minute walk   I have reviewed all medical records from hospital follow up including radiology reports and consults from other physicians. Appropriate follow up diagnostics will be scheduled as needed. Patient/ Family understands the plan of treatment. Time spent 35 minutes.   Dr Lavera Guise, MD Internal Medicine

## 2020-05-11 ENCOUNTER — Telehealth: Payer: Self-pay

## 2020-05-11 MED ORDER — LEVOFLOXACIN 500 MG PO TABS: 500.0000 mg | ORAL_TABLET | Freq: Every day | ORAL | 0 refills | Status: DC

## 2020-05-11 NOTE — Telephone Encounter (Signed)
Pt called c/o having pain and pressure with urinating, pt showed some abnormal values on her urine dip on 05/09/20.  [facility] sent in levaquin 500 mg for 7 days to pharmacy.  Pt notified.

## 2020-05-12 LAB — CULTURE, URINE COMPREHENSIVE

## 2020-05-18 ENCOUNTER — Ambulatory Visit: Payer: BC Managed Care – PPO | Admitting: Internal Medicine

## 2020-05-23 ENCOUNTER — Ambulatory Visit: Payer: BC Managed Care – PPO | Admitting: Hospice and Palliative Medicine

## 2020-05-31 ENCOUNTER — Encounter: Payer: Self-pay | Admitting: Internal Medicine

## 2020-05-31 ENCOUNTER — Ambulatory Visit: Payer: BC Managed Care – PPO | Admitting: Internal Medicine

## 2020-05-31 ENCOUNTER — Other Ambulatory Visit: Payer: Self-pay

## 2020-05-31 DIAGNOSIS — U071 COVID-19: Secondary | ICD-10-CM | POA: Diagnosis not present

## 2020-05-31 DIAGNOSIS — I10 Essential (primary) hypertension: Secondary | ICD-10-CM

## 2020-05-31 DIAGNOSIS — J1282 Pneumonia due to coronavirus disease 2019: Secondary | ICD-10-CM

## 2020-05-31 DIAGNOSIS — E1165 Type 2 diabetes mellitus with hyperglycemia: Secondary | ICD-10-CM | POA: Diagnosis not present

## 2020-05-31 MED ORDER — BENAZEPRIL-HYDROCHLOROTHIAZIDE 20-12.5 MG PO TABS: ORAL_TABLET | ORAL | 1 refills | Status: DC

## 2020-05-31 NOTE — Progress Notes (Signed)
[facility] [place], [place] [address]  Internal MEDICINE  Office Visit Note  Patient Name: Kristen Sparks  [id number]  [id number]  Date of Service: 06/06/2020  Chief Complaint  Patient presents with  . Follow-up    refill request, review urine test  . Asthma  . Anxiety  . Depression  . Diabetes  . Hyperlipidemia  . Hypertension  . Sleep Apnea  . Quality Metric Gaps    pneumovax   . policy update form    received    HPI Pt is here for follow up 1. Developed covid over a month ago. Improved, mild sob, mild fatigue 2. Has DM, was given samples of Steglatro however did not take them due to concerns of side effects  3. Blood pressure is not well controlled, does take her Lotensin  4. Has chronic arthritis and back pain   Current Medication: Outpatient Encounter Medications as of 05/31/2020  Medication Sig  . albuterol (VENTOLIN HFA) 108 (90 Base) MCG/ACT inhaler Inhale 2 puffs into the lungs every 4 (four) hours as needed for wheezing or shortness of breath.  . ALPRAZolam (XANAX) 0.5 MG tablet Take 1/2 t o1 tablet po QD prn (Patient taking differently: Take 0.5 mg by mouth daily as needed for anxiety. )  . benazepril-hydrochlorthiazide (LOTENSIN HCT) 20-12.5 MG tablet Take one tab po qd for blood pressure  . benzonatate (TESSALON) 100 MG capsule Take 1 capsule (100 mg total) by mouth 3 (three) times daily as needed for cough.  Marland Kitchen levofloxacin (LEVAQUIN) 500 MG tablet Take 1 tablet (500 mg total) by mouth daily.  Marland Kitchen omeprazole (PRILOSEC) 40 MG capsule Take 1 capsule (40 mg total) by mouth 2 (two) times daily.  . polyethylene glycol (MIRALAX / GLYCOLAX) 17 g packet Take 17 g by mouth daily as needed for mild constipation.  . sertraline (ZOLOFT) 100 MG tablet Take 1 tablet (100 mg total) by mouth daily.  . traMADol (ULTRAM) 50 MG tablet TAKE 1 TABLET BY MOUTH TWICE DAILY AS NEEDED FOR SEVERE PAIN (Patient taking differently: Take 50 mg by mouth daily. )  .  [DISCONTINUED] benazepril (LOTENSIN) 10 MG tablet Take 10 mg by mouth daily.  . [DISCONTINUED] metFORMIN (GLUCOPHAGE) 500 MG tablet Take 1 tablet (500 mg total) by mouth 2 (two) times daily with a meal.   No facility-administered encounter medications on file as of 05/31/2020.    Surgical History: Past Surgical History:  Procedure Laterality Date  . CHOLECYSTECTOMY  2000  . colon polyectomy    . COLONOSCOPY WITH PROPOFOL N/A 03/31/2018   Procedure: COLONOSCOPY WITH PROPOFOL;  Surgeon: Jonathon Bellows, MD;  [facility] ENDOSCOPY;  Service: Gastroenterology;  Laterality: N/A;  . DENTAL SURGERY    . FOOT SURGERY      Medical History: Past Medical History:  Diagnosis Date  . Anxiety   . Asthma   . Depression   . Environmental allergies   . Hyperlipidemia   . Hypertension   . Rheumatoid arthritis ([place])   . Sleep apnea 2019   Borderline  . Type 2 diabetes mellitus (HCC)     Family History: Family History  Problem Relation Age of Onset  . Diabetes Mother   . COPD Mother   . Uterine cancer Mother 34  . Cancer Sister 61       Bile Duct  . Breast cancer Neg Hx     Social History   Socioeconomic History  . Marital status: Married    Spouse name:  Not on file  . Number of children: Not on file  . Years of education: Not on file  . Highest education level: Not on file  Occupational History  . Not on file  Tobacco Use  . Smoking status: Never Smoker  . Smokeless tobacco: Never Used  Vaping Use  . Vaping Use: Never used  Substance and Sexual Activity  . Alcohol use: Yes    Comment: occ.  . Drug use: No  . Sexual activity: Yes    Birth control/protection: Post-menopausal  Other Topics Concern  . Not on file  Social History Narrative  . Not on file   Social Determinants of Health   Financial Resource Strain: Not on file  Food Insecurity: Not on file  Transportation Needs: Not on file  Physical Activity: Not on file  Stress: Not on file  Social Connections: Not  on file  Intimate Partner Violence: Not on file      Review of Systems  Constitutional: Negative for chills, diaphoresis and fatigue.  HENT: Negative for ear pain, postnasal drip and sinus pressure.   Eyes: Negative for photophobia, discharge, redness, itching and visual disturbance.  Respiratory: Negative for cough, shortness of breath and wheezing.   Cardiovascular: Negative for chest pain, palpitations and leg swelling.  Gastrointestinal: Negative for abdominal pain, constipation, diarrhea, nausea and vomiting.  Genitourinary: Negative for dysuria and flank pain.  Musculoskeletal: Negative for arthralgias, back pain, gait problem and neck pain.  Skin: Negative for color change.  Allergic/Immunologic: Negative for environmental allergies and food allergies.  Neurological: Negative for dizziness and headaches.  Hematological: Does not bruise/bleed easily.  Psychiatric/Behavioral: Negative for agitation, behavioral problems (depression) and hallucinations.    Vital Signs: BP (!) 166/90 Comment: 168/96  Pulse 80   Temp (!) 97.1 F (36.2 C)   Resp 16   Ht 5\' 3"  (1.6 m)   Wt 216 lb (98 kg)   SpO2 98%   BMI 38.26 kg/m    Physical Exam Constitutional:      General: She is not in acute distress.    Appearance: She is well-developed and well-nourished. She is not diaphoretic.  HENT:     Head: Normocephalic and atraumatic.     Mouth/Throat:     Mouth: Oropharynx is clear and moist.     Pharynx: No oropharyngeal exudate.  Eyes:     Extraocular Movements: EOM normal.     Pupils: Pupils are equal, round, and reactive to light.  Neck:     Thyroid: No thyromegaly.     Vascular: No JVD.     Trachea: No tracheal deviation.  Cardiovascular:     Rate and Rhythm: Normal rate and regular rhythm.     Heart sounds: Normal heart sounds. No murmur heard. No friction rub. No gallop.   Pulmonary:     Effort: Pulmonary effort is normal. No respiratory distress.     Breath sounds: No  wheezing or rales.  Chest:     Chest wall: No tenderness.  Abdominal:     General: Bowel sounds are normal.     Palpations: Abdomen is soft.  Musculoskeletal:        General: Normal range of motion.     Cervical back: Normal range of motion and neck supple.  Lymphadenopathy:     Cervical: No cervical adenopathy.  Skin:    General: Skin is warm and dry.  Neurological:     Mental Status: She is alert and oriented to person, place, and time.  Cranial Nerves: No cranial nerve deficit.  Psychiatric:        Mood and Affect: Mood and affect normal.        Behavior: Behavior normal.        Thought Content: Thought content normal.        Judgment: Judgment normal.    Assessment/Plan: 1. Pneumonia due to COVID-19 virus Will need follow up CXR  - DG Chest 2 View; Future  2. Uncontrolled type 2 diabetes mellitus with hyperglycemia ([place]) Question and concerns about side effects are answered, she will take Steglatro 5 mg po qd   3. Benign hypertension Increase and combine Lotensin and hctz for better compliance   General Counseling: Briann verbalizes understanding of the findings of todays visit and agrees with plan of treatment. I have discussed any further diagnostic evaluation that may be needed or ordered today. We also reviewed her medications today. she has been encouraged to call the office with any questions or concerns that should arise related to todays visit.   Orders Placed This Encounter  Procedures  . DG Chest 2 View    Meds ordered this encounter  Medications  . benazepril-hydrochlorthiazide (LOTENSIN HCT) 20-12.5 MG tablet    Sig: Take one tab po qd for blood pressure    Dispense:  90 tablet    Refill:  1    Total time spent: 35 Minutes Time spent includes review of chart, medications, test results, and follow up plan with the patient.   Dr Lavera Guise Internal medicine

## 2020-06-01 ENCOUNTER — Other Ambulatory Visit: Payer: Self-pay

## 2020-06-01 MED ORDER — FLUCONAZOLE 150 MG PO TABS: ORAL_TABLET | ORAL | 0 refills | Status: DC

## 2020-06-08 ENCOUNTER — Other Ambulatory Visit: Payer: Self-pay

## 2020-06-08 ENCOUNTER — Telehealth: Payer: Self-pay

## 2020-06-08 ENCOUNTER — Other Ambulatory Visit: Payer: Self-pay | Admitting: Internal Medicine

## 2020-06-08 MED ORDER — TRAMADOL HCL 50 MG PO TABS
ORAL_TABLET | ORAL | 1 refills | Status: DC
Start: 2020-06-08 — End: 2020-09-13

## 2020-06-08 MED ORDER — STEGLATRO 5 MG PO TABS: ORAL_TABLET | ORAL | 1 refills | Status: DC

## 2020-06-08 NOTE — Telephone Encounter (Signed)
Sent rx.

## 2020-06-16 ENCOUNTER — Other Ambulatory Visit: Payer: Self-pay

## 2020-06-16 MED ORDER — EMPAGLIFLOZIN 10 MG PO TABS: 10.0000 mg | ORAL_TABLET | Freq: Every day | ORAL | 0 refills | Status: DC

## 2020-06-21 ENCOUNTER — Other Ambulatory Visit: Payer: Self-pay

## 2020-06-21 ENCOUNTER — Ambulatory Visit (INDEPENDENT_AMBULATORY_CARE_PROVIDER_SITE_OTHER): Payer: BC Managed Care – PPO | Admitting: Obstetrics and Gynecology

## 2020-06-21 ENCOUNTER — Encounter: Payer: Self-pay | Admitting: Obstetrics and Gynecology

## 2020-06-21 VITALS — BP 136/86 | Ht 63.0 in | Wt 211.0 lb

## 2020-06-21 DIAGNOSIS — Z1239 Encounter for other screening for malignant neoplasm of breast: Secondary | ICD-10-CM

## 2020-06-21 DIAGNOSIS — Z01419 Encounter for gynecological examination (general) (routine) without abnormal findings: Secondary | ICD-10-CM | POA: Diagnosis not present

## 2020-06-21 NOTE — Progress Notes (Signed)
Gynecology Annual Exam  PCP: Lavera Guise, MD  Chief Complaint:  Chief Complaint  Patient presents with  . Gynecologic Exam    Annual - no concerns. RM 5    History of Present Illness:Patient is a 59 y.o. G2P1011 presents for annual exam. The patient has no complaints today.   LMP: No LMP recorded. Patient is postmenopausal. No PMB.  Hospitalized in October for [place].  The patient is sexually active. She denies dyspareunia.  The patient does perform self breast exams.  There is no notable family history of breast or ovarian cancer in her family.  The patient wears seatbelts: yes.   The patient has regular exercise: not asked.    The patient denies current symptoms of depression.     Review of Systems: Review of Systems  Constitutional: Negative.  Negative for chills and fever.  HENT: Negative for congestion.   Respiratory: Negative for cough and shortness of breath.   Cardiovascular: Negative for chest pain and palpitations.  Gastrointestinal: Negative.  Negative for abdominal pain, constipation, diarrhea, heartburn, nausea and vomiting.  Genitourinary: Positive for frequency. Negative for dysuria and urgency.  Skin: Negative for itching and rash.  Neurological: Negative for dizziness and headaches.  Endo/Heme/Allergies: Negative for polydipsia.  Psychiatric/Behavioral: Negative for depression.    Past Medical History:  Patient Active Problem List   Diagnosis Date Noted  . Pneumonia due to COVID-19 virus 04/18/2020  . Asthma   . Hypokalemia   . Acute respiratory failure with hypoxia ([place])   . Constipation 11/24/2019  . Gastroesophageal reflux disease with esophagitis without hemorrhage 11/24/2019  . Acute recurrent frontal sinusitis 10/07/2019  . Diabetes mellitus without complication ([place]) [id number]  . Gastroenteritis 02/08/2019  . Nausea 02/08/2019  . Depression with anxiety 11/07/2018  . Chest pain 09/26/2018  . Primary localized osteoarthrosis 09/26/2018   . Muscle pain 09/26/2018  . Flu-like symptoms 07/09/2018  . Acute upper respiratory infection 07/09/2018  . Cough 07/09/2018  . Wheezing 07/09/2018  . Type 2 diabetes mellitus with hyperglycemia ([place]) 08/02/2017  . Essential (primary) hypertension 08/02/2017  . Other obesity due to excess calories 08/02/2017  . Generalized anxiety disorder 08/02/2017    Past Surgical History:  Past Surgical History:  Procedure Laterality Date  . CHOLECYSTECTOMY  2000  . colon polyectomy    . COLONOSCOPY WITH PROPOFOL N/A 03/31/2018   Procedure: COLONOSCOPY WITH PROPOFOL;  Surgeon: Jonathon Bellows, MD;  Location: [facility] ENDOSCOPY;  Service: Gastroenterology;  Laterality: N/A;  . DENTAL SURGERY    . FOOT SURGERY      Gynecologic History:  No LMP recorded. Patient is postmenopausal. Last Pap: Results were: 03/20/2018 NIL and HR HPV negative  Last mammogram: 08/10/2019 Results were: BI-RAD I  Obstetric History: [id number]  Family History:  Family History  Problem Relation Age of Onset  . Diabetes Mother   . COPD Mother   . Uterine cancer Mother 69  . Cancer Sister 104       Bile Duct  . Breast cancer Neg Hx     Social History:  Social History   Socioeconomic History  . Marital status: Married    Spouse name: Not on file  . Number of children: Not on file  . Years of education: Not on file  . Highest education level: Not on file  Occupational History  . Not on file  Tobacco Use  . Smoking status: Never Smoker  . Smokeless tobacco: Never Used  Vaping Use  . Vaping Use:  Never used  Substance and Sexual Activity  . Alcohol use: Yes    Comment: occ.  . Drug use: No  . Sexual activity: Yes    Birth control/protection: Post-menopausal  Other Topics Concern  . Not on file  Social History Narrative  . Not on file   Social Determinants of Health   Financial Resource Strain: Not on file  Food Insecurity: Not on file  Transportation Needs: Not on file  Physical Activity: Not on file   Stress: Not on file  Social Connections: Not on file  Intimate Partner Violence: Not on file    Allergies:  Allergies  Allergen Reactions  . Azithromycin Hives  . Contrast Media [Iodinated Diagnostic Agents]     Medications: Prior to Admission medications   Medication Sig Start Date End Date Taking? Authorizing Provider  albuterol (VENTOLIN HFA) 108 (90 Base) MCG/ACT inhaler Inhale 2 puffs into the lungs every 4 (four) hours as needed for wheezing or shortness of breath. 04/23/20  Yes Osvaldo Shipper, MD  ALPRAZolam Prudy Feeler) 0.5 MG tablet Take 1/2 t o1 tablet po QD prn Patient taking differently: Take 0.5 mg by mouth daily as needed for anxiety. 11/24/19  Yes Carlean Jews, NP  benazepril-hydrochlorthiazide (LOTENSIN HCT) 20-12.5 MG tablet Take one tab po qd for blood pressure 05/31/20  Yes Lyndon Code, MD  empagliflozin (JARDIANCE) 10 MG TABS tablet Take 1 tablet (10 mg total) by mouth daily. Take 10 mg by mouth daily. 06/16/20  Yes Lyndon Code, MD  omeprazole (PRILOSEC) 40 MG capsule Take 1 capsule (40 mg total) by mouth 2 (two) times daily. 03/18/20  Yes [place], Coralee North, NP  polyethylene glycol (MIRALAX / GLYCOLAX) 17 g packet Take 17 g by mouth daily as needed for mild constipation. 04/23/20  Yes Osvaldo Shipper, MD  sertraline (ZOLOFT) 100 MG tablet Take 1 tablet (100 mg total) by mouth daily. 05/06/20  Yes Carlean Jews, NP  traMADol (ULTRAM) 50 MG tablet Take one tab po bid for pain prn only 06/08/20  Yes Lyndon Code, MD  benzonatate (TESSALON) 100 MG capsule Take 1 capsule (100 mg total) by mouth 3 (three) times daily as needed for cough. Patient not taking: Reported on 06/21/2020 04/23/20   Osvaldo Shipper, MD  fluconazole (DIFLUCAN) 150 MG tablet Take one tablet once may repeat in 3 days if symptoms persists Patient not taking: Reported on 06/21/2020 06/01/20   Lyndon Code, MD  levofloxacin (LEVAQUIN) 500 MG tablet Take 1 tablet (500 mg total) by mouth  daily. Patient not taking: Reported on 06/21/2020 05/11/20   Lyndon Code, MD    Physical Exam Vitals: Blood pressure 136/86, height 5\' 3"  (1.6 m), weight 211 lb (95.7 kg).  General: NAD HEENT: normocephalic, anicteric Thyroid: no enlargement, no palpable nodules Pulmonary: No increased work of breathing, CTAB Cardiovascular: RRR, distal pulses 2+ Breast: Breast symmetrical, no tenderness, no palpable nodules or masses, no skin or nipple retraction present, no nipple discharge.  No axillary or supraclavicular lymphadenopathy. Abdomen: NABS, soft, non-tender, non-distended.  Umbilicus without lesions.  No hepatomegaly, splenomegaly or masses palpable. No evidence of hernia  Genitourinary:  External: Normal external female genitalia.  Normal urethral meatus, normal Bartholin's and Skene's glands.    Vagina: Normal vaginal mucosa, no evidence of prolapse.    Cervix: Grossly normal in appearance, no bleeding  Uterus: Non-enlarged, mobile, normal contour.  No CMT  Adnexa: ovaries non-enlarged, no adnexal masses  Rectal: deferred  Lymphatic: no evidence of inguinal  lymphadenopathy Extremities: no edema, erythema, or tenderness Neurologic: Grossly intact Psychiatric: mood appropriate, affect full  Female chaperone present for pelvic and breast  portions of the physical exam  Immunization History  Administered Date(s) Administered  . Influenza-Unspecified 08/28/2017  . Pneumococcal-Unspecified 08/28/2017     Assessment: 59 y.o. G2P1011 routine annual exam  Plan: Problem List Items Addressed This Visit   None     1) Mammogram - recommend yearly screening mammogram.  Mammogram Was ordered today for February.  2) STI screening  was notoffered and therefore not obtained  3) ASCCP guidelines and rational discussed.  Patient opts for every 3 years screening interval  4) Osteoporosis  - per USPTF routine screening DEXA at age 74  5) Routine healthcare maintenance including  cholesterol, diabetes screening discussed managed by PCP  6) Colonoscopy per PCP  7) No follow-ups on file.    Malachy Mood, MD Mosetta Pigeon, [facility] 06/21/2020, 8:49 AM

## 2020-06-23 ENCOUNTER — Other Ambulatory Visit: Payer: Self-pay

## 2020-06-23 MED ORDER — STEGLATRO 5 MG PO TABS: 5.0000 mg | ORAL_TABLET | Freq: Every day | ORAL | 3 refills | Status: DC

## 2020-06-27 ENCOUNTER — Telehealth: Payer: Self-pay | Admitting: Obstetrics and Gynecology

## 2020-06-27 NOTE — Telephone Encounter (Signed)
Please advise 

## 2020-06-27 NOTE — Telephone Encounter (Signed)
Pt is requesting red pill for yeast infection.

## 2020-06-28 ENCOUNTER — Other Ambulatory Visit: Payer: Self-pay

## 2020-06-28 MED ORDER — STEGLATRO 5 MG PO TABS: 5.0000 mg | ORAL_TABLET | Freq: Every day | ORAL | 3 refills | Status: DC

## 2020-06-29 ENCOUNTER — Telehealth: Payer: Self-pay

## 2020-06-29 ENCOUNTER — Other Ambulatory Visit: Payer: Self-pay | Admitting: Obstetrics and Gynecology

## 2020-06-29 MED ORDER — FLUCONAZOLE 150 MG PO TABS: 150.0000 mg | ORAL_TABLET | Freq: Once | ORAL | 0 refills | Status: AC

## 2020-06-29 NOTE — Telephone Encounter (Signed)
Spoke to pt and informed her that insurance doesn't cover the [facility] and that we sent in Webb to her pharmacy.  I informed pt that she can go on the website for [facility] and get the coupon they have.  I informed pt to let us know if she was able to use the coupon.

## 2020-06-29 NOTE — Telephone Encounter (Signed)
Please advise 

## 2020-06-29 NOTE — Telephone Encounter (Signed)
Patient calling in regards to needing meds.   Cb# [phone]

## 2020-07-15 ENCOUNTER — Other Ambulatory Visit: Payer: Self-pay

## 2020-07-17 ENCOUNTER — Telehealth: Payer: Self-pay

## 2020-07-17 NOTE — Telephone Encounter (Signed)
PA approved for Steglatro 5 mg tablets on 07/17/2020 valid from 07/17/2020 to 07/16/2021

## 2020-07-17 NOTE — Telephone Encounter (Signed)
PA approved for Tramadol 50 mg tabs on 07/17/2020 valid from 07/17/2020 to 07/15/2021

## 2020-07-18 ENCOUNTER — Other Ambulatory Visit: Payer: Self-pay

## 2020-07-18 MED ORDER — STEGLATRO 5 MG PO TABS: 5.0000 mg | ORAL_TABLET | Freq: Every day | ORAL | 3 refills | Status: DC

## 2020-07-19 ENCOUNTER — Other Ambulatory Visit: Payer: Self-pay

## 2020-07-19 MED ORDER — STEGLATRO 5 MG PO TABS: 5.0000 mg | ORAL_TABLET | Freq: Every day | ORAL | 3 refills | Status: DC

## 2020-07-27 ENCOUNTER — Other Ambulatory Visit: Payer: Self-pay

## 2020-07-28 ENCOUNTER — Other Ambulatory Visit: Payer: Self-pay

## 2020-07-29 ENCOUNTER — Ambulatory Visit: Payer: BC Managed Care – PPO | Admitting: Hospice and Palliative Medicine

## 2020-08-05 ENCOUNTER — Telehealth: Payer: Self-pay

## 2020-08-05 NOTE — Telephone Encounter (Signed)
Pt calling for rx for yeast inf - reddish pink pill; [facility].  [phone] Pt states sxs are she's itchy, swollen, white mucus coming out, lot of pain.  Adv will let AMS know.

## 2020-08-05 NOTE — Telephone Encounter (Signed)
We just treated a month ago, she'll need an appointment to have it evaluated if its back already.  This is non-emergent it can be first available

## 2020-08-08 NOTE — Telephone Encounter (Signed)
Called and spoke with patient about being scheduled. Patient states she had  A pink pill left and she took it. And her symptoms are getting better. Patient states she didn't want to scheduled at this time.

## 2020-08-15 ENCOUNTER — Encounter: Payer: Self-pay | Admitting: Internal Medicine

## 2020-08-15 ENCOUNTER — Other Ambulatory Visit: Payer: Self-pay

## 2020-08-15 ENCOUNTER — Ambulatory Visit: Payer: BC Managed Care – PPO | Admitting: Internal Medicine

## 2020-08-15 ENCOUNTER — Ambulatory Visit
Admission: RE | Admit: 2020-08-15 | Discharge: 2020-08-15 | Disposition: A | Discharge status: Home / Self Care | Payer: BC Managed Care – PPO | Source: Ambulatory Visit | Attending: Obstetrics and Gynecology | Admitting: Obstetrics and Gynecology

## 2020-08-15 VITALS — BP 139/80 | HR 82 | Temp 97.6°F | Resp 16 | Ht 63.0 in | Wt 213.4 lb

## 2020-08-15 DIAGNOSIS — Z1231 Encounter for screening mammogram for malignant neoplasm of breast: Secondary | ICD-10-CM | POA: Insufficient documentation

## 2020-08-15 DIAGNOSIS — F411 Generalized anxiety disorder: Secondary | ICD-10-CM | POA: Diagnosis not present

## 2020-08-15 DIAGNOSIS — Z1239 Encounter for other screening for malignant neoplasm of breast: Secondary | ICD-10-CM

## 2020-08-15 DIAGNOSIS — E1165 Type 2 diabetes mellitus with hyperglycemia: Secondary | ICD-10-CM | POA: Diagnosis not present

## 2020-08-15 DIAGNOSIS — I10 Essential (primary) hypertension: Secondary | ICD-10-CM

## 2020-08-15 MED ORDER — SERTRALINE HCL 100 MG PO TABS: 100.0000 mg | ORAL_TABLET | Freq: Every day | ORAL | 1 refills | Status: DC

## 2020-08-15 MED ORDER — BENAZEPRIL-HYDROCHLOROTHIAZIDE 20-12.5 MG PO TABS: ORAL_TABLET | ORAL | 1 refills | Status: DC

## 2020-08-15 MED ORDER — FLUCONAZOLE 150 MG PO TABS: ORAL_TABLET | ORAL | 0 refills | Status: DC

## 2020-08-15 NOTE — Progress Notes (Signed)
[facility] [place], [place] [address]  Internal MEDICINE  Office Visit Note  Patient Name: Kristen Sparks  [id number]  [id number]  Date of Service: 08/18/2020  Chief Complaint  Patient presents with  . Follow-up  . Diabetes  . Depression  . Hyperlipidemia  . Hypertension    HPI Pt is here for routine follow up, recovered fully from COVID Pneumonia, has lost 10 lbs after staring treatment for dm with Steglatro. Watching her diet.    denies any other problems.    Current Medication: Outpatient Encounter Medications as of 08/15/2020  Medication Sig  . [DISCONTINUED] fluconazole (DIFLUCAN) 150 MG tablet Take one tab po qd prn for yeats infection  . albuterol (VENTOLIN HFA) 108 (90 Base) MCG/ACT inhaler Inhale 2 puffs into the lungs every 4 (four) hours as needed for wheezing or shortness of breath.  . ALPRAZolam (XANAX) 0.5 MG tablet Take 1/2 t o1 tablet po QD prn (Patient taking differently: Take 0.5 mg by mouth daily as needed for anxiety.)  . ertugliflozin L-PyroglutamicAc (STEGLATRO) 5 MG TABS tablet Take 1 tablet (5 mg total) by mouth daily before breakfast. E11.65  . omeprazole (PRILOSEC) 40 MG capsule Take 1 capsule (40 mg total) by mouth 2 (two) times daily.  . polyethylene glycol (MIRALAX / GLYCOLAX) 17 g packet Take 17 g by mouth daily as needed for mild constipation.  . traMADol (ULTRAM) 50 MG tablet Take one tab po bid for pain prn only  . [DISCONTINUED] benazepril-hydrochlorthiazide (LOTENSIN HCT) 20-12.5 MG tablet Take one tab po qd for blood pressure  . [DISCONTINUED] benazepril-hydrochlorthiazide (LOTENSIN HCT) 20-12.5 MG tablet Take one tab po qd for blood pressure  . [DISCONTINUED] benzonatate (TESSALON) 100 MG capsule Take 1 capsule (100 mg total) by mouth 3 (three) times daily as needed for cough. (Patient not taking: Reported on 06/21/2020)  . [DISCONTINUED] empagliflozin (JARDIANCE) 10 MG TABS tablet Take 1 tablet (10 mg total) by mouth  daily. Take 10 mg by mouth daily.  . [DISCONTINUED] levofloxacin (LEVAQUIN) 500 MG tablet Take 1 tablet (500 mg total) by mouth daily. (Patient not taking: Reported on 06/21/2020)  . [DISCONTINUED] sertraline (ZOLOFT) 100 MG tablet Take 1 tablet (100 mg total) by mouth daily.  . [DISCONTINUED] sertraline (ZOLOFT) 100 MG tablet Take 1 tablet (100 mg total) by mouth daily.   No facility-administered encounter medications on file as of 08/15/2020.    Surgical History: Past Surgical History:  Procedure Laterality Date  . CHOLECYSTECTOMY  2000  . colon polyectomy    . COLONOSCOPY WITH PROPOFOL N/A 03/31/2018   Procedure: COLONOSCOPY WITH PROPOFOL;  Surgeon: Jonathon Bellows, MD;  Location: [facility] ENDOSCOPY;  Service: Gastroenterology;  Laterality: N/A;  . DENTAL SURGERY    . FOOT SURGERY      Medical History: Past Medical History:  Diagnosis Date  . Anxiety   . Asthma   . Depression   . Environmental allergies   . Hyperlipidemia   . Hypertension   . Rheumatoid arthritis ([place])   . Sleep apnea 2019   Borderline  . Type 2 diabetes mellitus (HCC)     Family History: Family History  Problem Relation Age of Onset  . Diabetes Mother   . COPD Mother   . Uterine cancer Mother 73  . Cancer Sister 14       Bile Duct  . Breast cancer Neg Hx     Social History   Socioeconomic History  . Marital status: Married    Spouse  name: Not on file  . Number of children: Not on file  . Years of education: Not on file  . Highest education level: Not on file  Occupational History  . Not on file  Tobacco Use  . Smoking status: Never Smoker  . Smokeless tobacco: Never Used  Vaping Use  . Vaping Use: Never used  Substance and Sexual Activity  . Alcohol use: Yes    Comment: occ.  . Drug use: No  . Sexual activity: Yes    Birth control/protection: Post-menopausal  Other Topics Concern  . Not on file  Social History Narrative  . Not on file   Social Determinants of Health   Financial  Resource Strain: Not on file  Food Insecurity: Not on file  Transportation Needs: Not on file  Physical Activity: Not on file  Stress: Not on file  Social Connections: Not on file  Intimate Partner Violence: Not on file      Review of Systems  Constitutional: Negative for chills, diaphoresis and fatigue.  HENT: Negative for ear pain, postnasal drip and sinus pressure.   Eyes: Negative for photophobia, discharge, redness, itching and visual disturbance.  Respiratory: Negative for cough, shortness of breath and wheezing.   Cardiovascular: Negative for chest pain, palpitations and leg swelling.  Gastrointestinal: Negative for abdominal pain, constipation, diarrhea, nausea and vomiting.  Genitourinary: Negative for dysuria and flank pain.  Musculoskeletal: Negative for arthralgias, back pain, gait problem and neck pain.  Skin: Negative for color change.  Allergic/Immunologic: Negative for environmental allergies and food allergies.  Neurological: Negative for dizziness and headaches.  Hematological: Does not bruise/bleed easily.  Psychiatric/Behavioral: Negative for agitation, behavioral problems (depression) and hallucinations.    Vital Signs: BP 139/80   Pulse 82   Temp 97.6 F (36.4 C)   Resp 16   Ht 5\' 3"  (1.6 m)   Wt 213 lb 6.4 oz (96.8 kg)   SpO2 99%   BMI 37.80 kg/m    Physical Exam Constitutional:      General: She is not in acute distress.    Appearance: She is well-developed. She is not diaphoretic.  HENT:     Head: Normocephalic and atraumatic.     Mouth/Throat:     Pharynx: No oropharyngeal exudate.  Eyes:     Pupils: Pupils are equal, round, and reactive to light.  Neck:     Thyroid: No thyromegaly.     Vascular: No JVD.     Trachea: No tracheal deviation.  Cardiovascular:     Rate and Rhythm: Normal rate and regular rhythm.     Heart sounds: Normal heart sounds. No murmur heard. No friction rub. No gallop.   Pulmonary:     Effort: Pulmonary effort  is normal. No respiratory distress.     Breath sounds: No wheezing or rales.  Chest:     Chest wall: No tenderness.  Abdominal:     General: Bowel sounds are normal.     Palpations: Abdomen is soft.  Musculoskeletal:        General: Normal range of motion.     Cervical back: Normal range of motion and neck supple.  Lymphadenopathy:     Cervical: No cervical adenopathy.  Skin:    General: Skin is warm and dry.  Neurological:     Mental Status: She is alert and oriented to person, place, and time.     Cranial Nerves: No cranial nerve deficit.  Psychiatric:        Behavior: Behavior  normal.        Thought Content: Thought content normal.        Judgment: Judgment normal.        Assessment/Plan: 1. Uncontrolled type 2 diabetes mellitus with hyperglycemia (HCC) Improved, will continue Therapy as before   2. Essential (primary) hypertension Controlled with meds   3. Generalized anxiety disorder Takes xanax only as needed   4. Long-term use of high-risk medication Continue prn tramadol and alprazolam  General Counseling: Lurline verbalizes understanding of the findings of todays visit and agrees with plan of treatment. I have discussed any further diagnostic evaluation that may be needed or ordered today. We also reviewed her medications today. she has been encouraged to call the office with any questions or concerns that should arise related to todays visit.   No orders of the defined types were placed in this encounter.   No orders of the defined types were placed in this encounter.   Meds ordered this encounter  Medications  . DISCONTD: fluconazole (DIFLUCAN) 150 MG tablet    Sig: Take one tab po qd prn for yeats infection    Dispense:  3 tablet    Refill:  0  . DISCONTD: sertraline (ZOLOFT) 100 MG tablet    Sig: Take 1 tablet (100 mg total) by mouth daily.    Dispense:  90 tablet    Refill:  1  . DISCONTD: benazepril-hydrochlorthiazide (LOTENSIN HCT) 20-12.5 MG  tablet    Sig: Take one tab po qd for blood pressure    Dispense:  90 tablet    Refill:  1    Total time spent:35  Minutes Time spent includes review of chart, medications, test results, and follow up plan with the patient.   [place] Controlled Substance Database was reviewed by me.   Dr Lavera Guise Internal medicine

## 2020-08-16 ENCOUNTER — Telehealth: Payer: Self-pay

## 2020-08-16 ENCOUNTER — Other Ambulatory Visit: Payer: Self-pay

## 2020-08-16 DIAGNOSIS — F411 Generalized anxiety disorder: Secondary | ICD-10-CM

## 2020-08-16 MED ORDER — FLUCONAZOLE 150 MG PO TABS: ORAL_TABLET | ORAL | 0 refills | Status: DC

## 2020-08-16 MED ORDER — SERTRALINE HCL 100 MG PO TABS: 100.0000 mg | ORAL_TABLET | Freq: Every day | ORAL | 1 refills | Status: DC

## 2020-08-16 MED ORDER — BENAZEPRIL-HYDROCHLOROTHIAZIDE 20-12.5 MG PO TABS: ORAL_TABLET | ORAL | 1 refills | Status: DC

## 2020-08-16 NOTE — Telephone Encounter (Signed)
Pt called she don't used  [facility] no more resend all med

## 2020-08-26 ENCOUNTER — Encounter: Payer: Self-pay | Admitting: Obstetrics and Gynecology

## 2020-08-26 ENCOUNTER — Ambulatory Visit (INDEPENDENT_AMBULATORY_CARE_PROVIDER_SITE_OTHER): Payer: BC Managed Care – PPO | Admitting: Obstetrics and Gynecology

## 2020-08-26 ENCOUNTER — Other Ambulatory Visit: Payer: Self-pay

## 2020-08-26 VITALS — BP 128/82 | Ht 63.0 in | Wt 212.5 lb

## 2020-08-26 DIAGNOSIS — R87629 Unspecified abnormal cytological findings in specimens from vagina: Secondary | ICD-10-CM | POA: Diagnosis not present

## 2020-08-26 DIAGNOSIS — R8290 Unspecified abnormal findings in urine: Secondary | ICD-10-CM | POA: Diagnosis not present

## 2020-08-26 DIAGNOSIS — R300 Dysuria: Secondary | ICD-10-CM | POA: Diagnosis not present

## 2020-08-26 DIAGNOSIS — N760 Acute vaginitis: Secondary | ICD-10-CM | POA: Diagnosis not present

## 2020-08-26 DIAGNOSIS — N952 Postmenopausal atrophic vaginitis: Secondary | ICD-10-CM

## 2020-08-26 MED ORDER — PREMARIN 0.625 MG/GM VA CREA: 1.0000 | TOPICAL_CREAM | VAGINAL | 3 refills | Status: DC

## 2020-08-26 NOTE — Progress Notes (Signed)
Obstetrics & Gynecology Office Visit   Chief Complaint:  Chief Complaint  Patient presents with  . Gynecologic Exam    ? Yeast infection, vaginal itching. Urine has a foul odor, also pain with intercourse. RM 5    History of Present Illness: Ms. Kristen Sparks is a 60 y.o. G2P1011 who LMP was No LMP recorded. Patient is postmenopausal., presents today for a problem visit.   Patient complains of an abnormal vaginal irritation. Discharge described as: normal and physiologic. Vaginal symptoms include local irritation.   Other associated symptoms: dysuria. Menstrual pattern: N/A. Contraception: post menopausal status.  She denies recent antibiotic exposure, denies changes in soaps, detergents coinciding with the onset of her symptoms.  She has not previously self treated or been under treatment by another provider for these symptoms. PMH history notable for DM2  Review of Systems: Review of Systems  Constitutional: Negative.   Gastrointestinal: Positive for abdominal pain.  Genitourinary: Positive for dysuria. Negative for flank pain, frequency, hematuria and urgency.  Skin: Negative.      Past Medical History:  Past Medical History:  Diagnosis Date  . Anxiety   . Asthma   . Depression   . Environmental allergies   . Hyperlipidemia   . Hypertension   . Rheumatoid arthritis ([place])   . Sleep apnea 2019   Borderline  . Type 2 diabetes mellitus ([place])     Past Surgical History:  Past Surgical History:  Procedure Laterality Date  . CHOLECYSTECTOMY  2000  . colon polyectomy    . COLONOSCOPY WITH PROPOFOL N/A 03/31/2018   Procedure: COLONOSCOPY WITH PROPOFOL;  Surgeon: Jonathon Bellows, MD;  [facility];  Service: Gastroenterology;  Laterality: N/A;  . DENTAL SURGERY    . FOOT SURGERY      Gynecologic History: No LMP recorded. Patient is postmenopausal.  Obstetric History: G2P1011  Family History:  Family History  Problem Relation Age of Onset  . Diabetes Mother    . COPD Mother   . Uterine cancer Mother 35  . Cancer Sister 47       Bile Duct  . Breast cancer Neg Hx     Social History:  Social History   Socioeconomic History  . Marital status: Married    Spouse name: Not on file  . Number of children: Not on file  . Years of education: Not on file  . Highest education level: Not on file  Occupational History  . Not on file  Tobacco Use  . Smoking status: Never Smoker  . Smokeless tobacco: Never Used  Vaping Use  . Vaping Use: Never used  Substance and Sexual Activity  . Alcohol use: Yes    Comment: occ.  . Drug use: No  . Sexual activity: Yes    Birth control/protection: Post-menopausal  Other Topics Concern  . Not on file  Social History Narrative  . Not on file   Social Determinants of Health   Financial Resource Strain: Not on file  Food Insecurity: Not on file  Transportation Needs: Not on file  Physical Activity: Not on file  Stress: Not on file  Social Connections: Not on file  Intimate Partner Violence: Not on file    Allergies:  Allergies  Allergen Reactions  . Azithromycin Hives  . Contrast Media [Iodinated Diagnostic Agents]     Medications: Prior to Admission medications   Medication Sig Start Date End Date Taking? Authorizing Provider  ALPRAZolam Duanne Moron) 0.5 MG tablet Take 1/2 t o1  tablet po QD prn Patient taking differently: Take 0.5 mg by mouth daily as needed for anxiety. 11/24/19  Yes Ronnell Freshwater, NP  benazepril-hydrochlorthiazide (LOTENSIN HCT) 20-12.5 MG tablet Take one tab po qd for blood pressure 08/16/20  Yes Lavera Guise, MD  conjugated estrogens (PREMARIN) vaginal cream Place 1 Applicatorful vaginally 2 (two) times a week. 08/29/20  Yes Malachy Mood, MD  ertugliflozin L-PyroglutamicAc (STEGLATRO) 5 MG TABS tablet Take 1 tablet (5 mg total) by mouth daily before breakfast. E11.65 07/19/20  Yes Lavera Guise, MD  fluconazole (DIFLUCAN) 150 MG tablet Take one tab po qd prn for yeats  infection 08/16/20  Yes Lavera Guise, MD  omeprazole (PRILOSEC) 40 MG capsule Take 1 capsule (40 mg total) by mouth 2 (two) times daily. 03/18/20  Yes [place], Audie Clear, NP  sertraline (ZOLOFT) 100 MG tablet Take 1 tablet (100 mg total) by mouth daily. 08/16/20  Yes Lavera Guise, MD  traMADol Veatrice Bourbon) 50 MG tablet Take one tab po bid for pain prn only 06/08/20  Yes Lavera Guise, MD  albuterol (VENTOLIN HFA) 108 (90 Base) MCG/ACT inhaler Inhale 2 puffs into the lungs every 4 (four) hours as needed for wheezing or shortness of breath. 04/23/20   Bonnielee Haff, MD  polyethylene glycol (MIRALAX / GLYCOLAX) 17 g packet Take 17 g by mouth daily as needed for mild constipation. 04/23/20   Bonnielee Haff, MD    Physical Exam Vitals:  Vitals:   08/26/20 0858  BP: 128/82   No LMP recorded. Patient is postmenopausal.  General: NAD HEENT: normocephalic, anicteric Pulmonary: No increased work of breathing  Genitourinary:  External: Normal external female genitalia.  Normal urethral meatus, normal  Bartholin's and Skene's glands.    Vagina: Normal vaginal mucosa, no evidence of prolapse.    Rectal: deferred  Lymphatic: no evidence of inguinal lymphadenopathy Extremities: no edema, erythema, or tenderness Neurologic: Grossly intact Psychiatric: mood appropriate, affect full  Female chaperone present for pelvic  portions of the physical exam  Assessment: 60 y.o. G2P1011 vaginal irriation  Plan: Problem List Items Addressed This Visit   None   Visit Diagnoses    Dysuria    -  Primary   Relevant Orders   Urine Culture   Malodorous urine       Relevant Orders   Urine Culture   Acute vaginitis       Relevant Orders   NuSwab BV and Candida, NAA   Abnormal cytological finding in specimen from vagina       Relevant Orders   NuSwab BV and Candida, NAA   Vaginal atrophy         1) Risk factors for bacterial vaginosis and candida infections discussed.  We discussed normal vaginal  flora/microbiome.  Any factors that may alter the microbiome increase the risk of these opportunistic infections.  These include changes in pH, antibiotic exposures, diabetes, wet bathing suits etc.  We discussed that treatment is aimed at eradicating abnormal bacterial overgrowth and or yeast.  There may be some role for vaginal probiotics in restoring normal vaginal flora.   - start premarin cream twice weekly bedtime - normal wet mount today aptima sent - urine culture sent  2) A total of 15 minutes were spent in face-to-face contact with the patient during this encounter with over half of that time devoted to counseling and coordination of care.  3) Return in about 8 weeks (around 10/21/2020) for medication follow up.   Malachy Mood,  MD, [facility], [facility] 08/26/2020, 9:32 AM

## 2020-08-29 ENCOUNTER — Other Ambulatory Visit: Payer: Self-pay | Admitting: Obstetrics and Gynecology

## 2020-08-29 LAB — NUSWAB BV AND CANDIDA, NAA
Candida albicans, NAA: POSITIVE — AB
Candida glabrata, NAA: NEGATIVE

## 2020-08-29 LAB — URINE CULTURE

## 2020-08-29 MED ORDER — FLUCONAZOLE 150 MG PO TABS: 150.0000 mg | ORAL_TABLET | ORAL | 1 refills | Status: AC

## 2020-08-29 NOTE — Progress Notes (Signed)
Nuswab is positive for yeast called in diflucan weekly for 4 weeks treatment course.

## 2020-09-01 ENCOUNTER — Other Ambulatory Visit: Payer: Self-pay | Admitting: Obstetrics and Gynecology

## 2020-09-01 MED ORDER — ESTRADIOL 0.1 MG/GM VA CREA: 1.0000 g | TOPICAL_CREAM | VAGINAL | 3 refills | Status: DC

## 2020-09-05 ENCOUNTER — Telehealth: Payer: Self-pay

## 2020-09-05 NOTE — Telephone Encounter (Signed)
PA approved for TRAMADOL 50 MG on 09/05/20 nd valid from 09/05/20 to 10/04/20

## 2020-09-13 ENCOUNTER — Telehealth: Payer: Self-pay

## 2020-09-13 DIAGNOSIS — F411 Generalized anxiety disorder: Secondary | ICD-10-CM

## 2020-09-13 MED ORDER — ALPRAZOLAM 0.5 MG PO TABS: ORAL_TABLET | ORAL | 0 refills | Status: DC

## 2020-09-13 MED ORDER — TRAMADOL HCL 50 MG PO TABS: ORAL_TABLET | ORAL | 1 refills | Status: DC

## 2020-09-13 NOTE — Telephone Encounter (Signed)
Sent!

## 2020-09-13 NOTE — Telephone Encounter (Signed)
Pt.notified

## 2020-10-19 ENCOUNTER — Encounter: Payer: Self-pay | Admitting: Obstetrics and Gynecology

## 2020-10-19 ENCOUNTER — Ambulatory Visit: Payer: BC Managed Care – PPO | Admitting: Obstetrics and Gynecology

## 2020-10-19 ENCOUNTER — Other Ambulatory Visit: Payer: Self-pay

## 2020-10-19 VITALS — BP 136/82 | Wt 212.0 lb

## 2020-10-19 DIAGNOSIS — N952 Postmenopausal atrophic vaginitis: Secondary | ICD-10-CM

## 2020-10-19 DIAGNOSIS — B354 Tinea corporis: Secondary | ICD-10-CM

## 2020-10-19 MED ORDER — ESTRADIOL 0.1 MG/GM VA CREA: 1.0000 g | TOPICAL_CREAM | VAGINAL | 3 refills | Status: DC

## 2020-10-19 MED ORDER — NYSTATIN 100000 UNIT/GM EX CREA
1.0000 | TOPICAL_CREAM | Freq: Two times a day (BID) | CUTANEOUS | 1 refills | Status: DC
Start: 2020-10-19 — End: 2024-04-15

## 2020-10-19 NOTE — Progress Notes (Signed)
Obstetrics & Gynecology Office Visit   Chief Complaint:  Chief Complaint  Patient presents with  . Follow-up    F/U yeast infection - doing great, no concerns. RM 4    History of Present Illness: 60 y.o. G2P1011 presenting for medication follow up for a diagnosis of dyspareunia secondary to postmenopausal atrophy and recurrent vaginal candida.  She is currently being managed with diflucan course (4 week) and vaginal HRT.   The patient reports improvement in symptoms but continued irritation in groin folds.  On her current medication regimen no PMB concerns.   She has not noted any side-effects or new symptoms.    Review of Systems: Review of Systems  Constitutional: Negative.   Gastrointestinal: Negative.   Genitourinary: Negative.   Skin: Positive for itching and rash.   Past Medical History:  Past Medical History:  Diagnosis Date  . Anxiety   . Asthma   . Depression   . Environmental allergies   . Hyperlipidemia   . Hypertension   . Rheumatoid arthritis ([place])   . Sleep apnea 2019   Borderline  . Type 2 diabetes mellitus ([place])     Past Surgical History:  Past Surgical History:  Procedure Laterality Date  . CHOLECYSTECTOMY  2000  . colon polyectomy    . COLONOSCOPY WITH PROPOFOL N/A 03/31/2018   Procedure: COLONOSCOPY WITH PROPOFOL;  Surgeon: Jonathon Bellows, MD;  Location: [facility] ENDOSCOPY;  Service: Gastroenterology;  Laterality: N/A;  . DENTAL SURGERY    . FOOT SURGERY      Gynecologic History: No LMP recorded. Patient is postmenopausal.  Obstetric History: G2P1011  Family History:  Family History  Problem Relation Age of Onset  . Diabetes Mother   . COPD Mother   . Uterine cancer Mother 74  . Cancer Sister 78       Bile Duct  . Breast cancer Neg Hx     Social History:  Social History   Socioeconomic History  . Marital status: Married    Spouse name: Not on file  . Number of children: Not on file  . Years of education: Not on file  . Highest  education level: Not on file  Occupational History  . Not on file  Tobacco Use  . Smoking status: Never Smoker  . Smokeless tobacco: Never Used  Vaping Use  . Vaping Use: Never used  Substance and Sexual Activity  . Alcohol use: Yes    Comment: occ.  . Drug use: No  . Sexual activity: Yes    Birth control/protection: Post-menopausal  Other Topics Concern  . Not on file  Social History Narrative  . Not on file   Social Determinants of Health   Financial Resource Strain: Not on file  Food Insecurity: Not on file  Transportation Needs: Not on file  Physical Activity: Not on file  Stress: Not on file  Social Connections: Not on file  Intimate Partner Violence: Not on file    Allergies:  Allergies  Allergen Reactions  . Azithromycin Hives  . Contrast Media [Iodinated Diagnostic Agents]     Medications: Prior to Admission medications   Medication Sig Start Date End Date Taking? Authorizing Provider  benazepril-hydrochlorthiazide (LOTENSIN HCT) 20-12.5 MG tablet Take one tab po qd for blood pressure 08/16/20  Yes Lavera Guise, MD  omeprazole (PRILOSEC) 40 MG capsule Take 1 capsule (40 mg total) by mouth 2 (two) times daily. 03/18/20  Yes Kendell Bane, NP  sertraline (ZOLOFT) 100 MG tablet  Take 1 tablet (100 mg total) by mouth daily. 08/16/20  Yes Lavera Guise, MD  traMADol Veatrice Bourbon) 50 MG tablet Take one tab po bid for pain prn only 09/13/20  Yes Lavera Guise, MD  ALPRAZolam Duanne Moron) 0.5 MG tablet Take half to one tab as needed a day ( 3 months supply 09/13/20   Lavera Guise, MD  ertugliflozin L-PyroglutamicAc (STEGLATRO) 5 MG TABS tablet Take 1 tablet (5 mg total) by mouth daily before breakfast. E11.65 07/19/20   Lavera Guise, MD  estradiol (ESTRACE VAGINAL) 0.1 MG/GM vaginal cream Place 1 g vaginally 3 (three) times a week. 09/02/20   Malachy Mood, MD    Physical Exam Vitals:  Vitals:   10/19/20 0848  BP: 136/82   No LMP recorded. Patient is  postmenopausal.  General: NAD HEENT: normocephalic, anicteric Pulmonary: No increased work of breathing Neurologic: Grossly intact Psychiatric: mood appropriate, affect full  Patient declined pelvic exam  Assessment: 60 y.o. G2P1011 recurrent vaginal candida, vaginal atrophy  Plan: Problem List Items Addressed This Visit   None   Visit Diagnoses    Tinea corporis    -  Primary   Relevant Medications   nystatin cream (MYCOSTATIN)   Vaginal atrophy         1) Vaginal candida but symptoms consistent with tinea corporis Improvement - rx nystatin  2) Vaginal atrophy - continue estrace  3) A total of 15 minutes were spent in face-to-face contact with the patient during this encounter with over half of that time devoted to counseling and coordination of care.  4) Return in about 5 months (around 03/21/2021) for annual.  Malachy Mood, MD, [place], [place] 10/19/2020, 9:13 AM

## 2020-11-09 DIAGNOSIS — S161XXA Strain of muscle, fascia and tendon at neck level, initial encounter: Secondary | ICD-10-CM | POA: Diagnosis not present

## 2020-11-09 DIAGNOSIS — M542 Cervicalgia: Secondary | ICD-10-CM | POA: Diagnosis not present

## 2020-11-09 DIAGNOSIS — Z8659 Personal history of other mental and behavioral disorders: Secondary | ICD-10-CM | POA: Diagnosis not present

## 2020-11-11 ENCOUNTER — Other Ambulatory Visit: Payer: Self-pay | Admitting: Internal Medicine

## 2020-11-11 DIAGNOSIS — F411 Generalized anxiety disorder: Secondary | ICD-10-CM

## 2020-11-14 ENCOUNTER — Encounter: Payer: Self-pay | Admitting: Physician Assistant

## 2020-11-14 ENCOUNTER — Ambulatory Visit (INDEPENDENT_AMBULATORY_CARE_PROVIDER_SITE_OTHER): Payer: BC Managed Care – PPO | Admitting: Physician Assistant

## 2020-11-14 ENCOUNTER — Other Ambulatory Visit: Payer: Self-pay

## 2020-11-14 DIAGNOSIS — F411 Generalized anxiety disorder: Secondary | ICD-10-CM

## 2020-11-14 DIAGNOSIS — I10 Essential (primary) hypertension: Secondary | ICD-10-CM

## 2020-11-14 DIAGNOSIS — E559 Vitamin D deficiency, unspecified: Secondary | ICD-10-CM

## 2020-11-14 DIAGNOSIS — E538 Deficiency of other specified B group vitamins: Secondary | ICD-10-CM

## 2020-11-14 DIAGNOSIS — E1165 Type 2 diabetes mellitus with hyperglycemia: Secondary | ICD-10-CM | POA: Diagnosis not present

## 2020-11-14 DIAGNOSIS — R300 Dysuria: Secondary | ICD-10-CM

## 2020-11-14 DIAGNOSIS — M1991 Primary osteoarthritis, unspecified site: Secondary | ICD-10-CM | POA: Diagnosis not present

## 2020-11-14 DIAGNOSIS — R5383 Other fatigue: Secondary | ICD-10-CM

## 2020-11-14 DIAGNOSIS — Z0001 Encounter for general adult medical examination with abnormal findings: Secondary | ICD-10-CM

## 2020-11-14 DIAGNOSIS — Z79899 Other long term (current) drug therapy: Secondary | ICD-10-CM

## 2020-11-14 LAB — POCT GLYCOSYLATED HEMOGLOBIN (HGB A1C): Hemoglobin A1C: 6.3 % — AB (ref 4.0–5.6)

## 2020-11-14 MED ORDER — TRAMADOL HCL 50 MG PO TABS: ORAL_TABLET | ORAL | 1 refills | Status: DC

## 2020-11-14 MED ORDER — BENAZEPRIL HCL 20 MG PO TABS: 20.0000 mg | ORAL_TABLET | Freq: Every day | ORAL | 3 refills | Status: DC

## 2020-11-14 NOTE — Progress Notes (Signed)
[facility] [place], [place] [address]  Internal MEDICINE  Office Visit Note  Patient Name: Kristen Sparks  [id number]  [id number]  Date of Service: 11/16/2020  Chief Complaint  Patient presents with  . Annual Exam    Review meds, cologuard  . Depression  . Anxiety  . Diabetes  . Hyperlipidemia  . Hypertension  . Sleep Apnea  . Asthma     HPI Pt is here for routine health maintenance examination and has no complaints today -She has annual exams with OBGYN and had her mammogram done in feb 2022. -She had her colonoscopy in 2019. Was told by insurance that she could not have a repeat colonoscopy q2 years as recommended by GI and instead will repeat after 5 years. She is wondering whether it is worth while to do cologuard in between -She is taking her steglatro, fasting BG around 113-120 -BP at home 120s/80 in Am, evening will go up to 150/80. The lotensin was too expensive so went back to old script of benazepril 10mg . -Takes xanax only as needed, maybe 2 tabs per week. She also takes her zoloft daily. -Needs tramadol for neck pain due to painful bone spurs, requesting refill today  Current Medication: Outpatient Encounter Medications as of 11/14/2020  Medication Sig  . ALPRAZolam (XANAX) 0.5 MG tablet Take half to one tab as needed a day ( 3 months supply  . benazepril (LOTENSIN) 20 MG tablet Take 1 tablet (20 mg total) by mouth daily.  . ertugliflozin L-PyroglutamicAc (STEGLATRO) 5 MG TABS tablet Take 1 tablet (5 mg total) by mouth daily before breakfast. E11.65  . estradiol (ESTRACE VAGINAL) 0.1 MG/GM vaginal cream Place 1 g vaginally 3 (three) times a week.  . nystatin cream (MYCOSTATIN) Apply 1 application topically 2 (two) times daily.  Marland Kitchen omeprazole (PRILOSEC) 40 MG capsule Take 1 capsule (40 mg total) by mouth 2 (two) times daily.  . sertraline (ZOLOFT) 100 MG tablet Take 1 tablet (100 mg total) by mouth daily.  . [DISCONTINUED] traMADol  (ULTRAM) 50 MG tablet Take one tab po bid for pain prn only  . traMADol (ULTRAM) 50 MG tablet Take one tab po bid for pain prn only  . [DISCONTINUED] benazepril-hydrochlorthiazide (LOTENSIN HCT) 20-12.5 MG tablet Take one tab po qd for blood pressure (Patient not taking: Reported on 11/14/2020)   No facility-administered encounter medications on file as of 11/14/2020.    Surgical History: Past Surgical History:  Procedure Laterality Date  . CHOLECYSTECTOMY  2000  . colon polyectomy    . COLONOSCOPY WITH PROPOFOL N/A 03/31/2018   Procedure: COLONOSCOPY WITH PROPOFOL;  Surgeon: Jonathon Bellows, MD;  Location: [facility] ENDOSCOPY;  Service: Gastroenterology;  Laterality: N/A;  . DENTAL SURGERY    . FOOT SURGERY      Medical History: Past Medical History:  Diagnosis Date  . Anxiety   . Asthma   . Depression   . Environmental allergies   . Hyperlipidemia   . Hypertension   . Rheumatoid arthritis ([place])   . Sleep apnea 2019   Borderline  . Type 2 diabetes mellitus ([facility])     Family History: Family History  Problem Relation Age of Onset  . Diabetes Mother   . COPD Mother   . Uterine cancer Mother 76  . Cancer Sister 23       Bile Duct  . Breast cancer Neg Hx       Review of Systems  Constitutional: Negative for chills, fatigue and unexpected  weight change.  HENT: Negative for congestion, postnasal drip, rhinorrhea, sneezing and sore throat.   Eyes: Negative for redness.  Respiratory: Negative for cough, chest tightness and shortness of breath.   Cardiovascular: Negative for chest pain and palpitations.  Gastrointestinal: Negative for abdominal pain, constipation, diarrhea, nausea and vomiting.  Genitourinary: Negative for dysuria and frequency.  Musculoskeletal: Positive for arthralgias, back pain and neck pain. Negative for joint swelling.  Skin: Negative for rash.  Neurological: Negative.  Negative for tremors and numbness.  Hematological: Negative for adenopathy. Does not  bruise/bleed easily.  Psychiatric/Behavioral: Negative for behavioral problems (Depression), sleep disturbance and suicidal ideas. The patient is nervous/anxious.      Vital Signs: BP (!) 152/90   Pulse 80   Temp (!) 97.4 F (36.3 C)   Resp 16   Ht 5\' 3"  (1.6 m)   Wt 215 lb (97.5 kg)   SpO2 98%   BMI 38.09 kg/m    Physical Exam Vitals and nursing note reviewed.  Constitutional:      General: She is not in acute distress.    Appearance: She is well-developed. She is obese. She is not diaphoretic.  HENT:     Head: Normocephalic and atraumatic.     Mouth/Throat:     Pharynx: No oropharyngeal exudate.  Eyes:     Pupils: Pupils are equal, round, and reactive to light.  Neck:     Thyroid: No thyromegaly.     Vascular: No JVD.     Trachea: No tracheal deviation.  Cardiovascular:     Rate and Rhythm: Normal rate and regular rhythm.     Pulses:          Dorsalis pedis pulses are 3+ on the right side and 3+ on the left side.       Posterior tibial pulses are 3+ on the right side and 3+ on the left side.     Heart sounds: Normal heart sounds. No murmur heard. No friction rub. No gallop.   Pulmonary:     Effort: Pulmonary effort is normal. No respiratory distress.     Breath sounds: No wheezing or rales.  Chest:     Chest wall: No tenderness.  Abdominal:     General: Bowel sounds are normal.     Palpations: Abdomen is soft.  Musculoskeletal:        General: Normal range of motion.     Cervical back: Normal range of motion and neck supple.     Right foot: Normal range of motion.     Left foot: Normal range of motion.  Feet:     Right foot:     Protective Sensation: 2 sites tested. 2 sites sensed.     Skin integrity: Skin integrity normal.     Toenail Condition: Right toenails are normal.     Left foot:     Protective Sensation: 2 sites tested. 2 sites sensed.     Skin integrity: Skin integrity normal.     Toenail Condition: Left toenails are normal.  Lymphadenopathy:      Cervical: No cervical adenopathy.  Skin:    General: Skin is warm and dry.  Neurological:     Mental Status: She is alert and oriented to person, place, and time.     Cranial Nerves: No cranial nerve deficit.  Psychiatric:        Behavior: Behavior normal.        Thought Content: Thought content normal.  Judgment: Judgment normal.      LABS: Recent Results (from the past 2160 hour(s))  NuSwab BV and Candida, NAA     Status: Abnormal   Collection Time: 08/26/20 11:53 AM  Result Value Ref Range   Atopobium vaginae Low - 0 Score   BVAB 2 Low - 0 Score   Megasphaera 1 Low - 0 Score    Comment: Calculate total score by adding the 3 individual bacterial vaginosis (BV) marker scores together.  Total score is interpreted as follows: Total score 0-1: Indicates the absence of BV. Total score   2: Indeterminate for BV. Additional clinical                  data should be evaluated to establish a                  diagnosis. Total score 3-6: Indicates the presence of BV. This test was developed and its performance characteristics determined by [facility].  It has not been cleared or approved by the Food and Drug Administration.    Candida albicans, NAA Positive (A) Negative   Candida glabrata, NAA Negative Negative  Urine Culture     Status: None   Collection Time: 08/26/20 11:55 AM   Specimen: Urine   UR  Result Value Ref Range   Urine Culture, Routine Final report    Organism ID, Bacteria Comment     Comment: Mixed urogenital flora 10,000-25,000 colony forming units per mL   UA/M w/rflx Culture, Routine     Status: Abnormal (Preliminary result)   Collection Time: 11/14/20  1:20 PM   Specimen: Urine   Urine  Result Value Ref Range   Specific Gravity, UA      >=1.030 (A) 1.005 - 1.030   pH, UA 5.5 5.0 - 7.5   Color, UA Yellow Yellow   Appearance Ur Cloudy (A) Clear   Leukocytes,UA Negative Negative   Protein,UA Negative Negative/Trace   Glucose, UA 3+ (A) Negative    Ketones, UA Negative Negative   RBC, UA Negative Negative   Bilirubin, UA Negative Negative   Urobilinogen, Ur 1.0 0.2 - 1.0 mg/dL   Nitrite, UA Negative Negative   Microscopic Examination Comment     Comment: Microscopic follows if indicated.   Microscopic Examination See below:     Comment: Microscopic was indicated and was performed.   Urinalysis Reflex Comment     Comment: This specimen has reflexed to a Urine Culture.  Microscopic Examination     Status: Abnormal   Collection Time: 11/14/20  1:20 PM   Urine  Result Value Ref Range   WBC, UA 0-5 0 - 5 /hpf   RBC 0-2 0 - 2 /hpf   Epithelial Cells (non renal) >10 (A) 0 - 10 /hpf   Casts None seen None seen /lpf   Crystals Present (A) N/A   Crystal Type Calcium Oxalate N/A   Bacteria, UA Many (A) None seen/Few  Urine Culture, Reflex     Status: None (Preliminary result)   Collection Time: 11/14/20  1:20 PM   Urine  Result Value Ref Range   Urine Culture, Routine WILL FOLLOW   POCT HgB A1C     Status: Abnormal   Collection Time: 11/14/20  1:36 PM  Result Value Ref Range   Hemoglobin A1C 6.3 (A) 4.0 - 5.6 %   HbA1c POC (<> result, manual entry)     HbA1c, POC (prediabetic range)     HbA1c, POC (controlled diabetic  range)          Assessment/Plan: 1. Encounter for general adult medical examination with abnormal findings Up to date on colonoscopy, mammogram UTD and done by OBGYN, routine labs ordered  2. Uncontrolled type 2 diabetes mellitus with hyperglycemia ([facility]) - POCT HgB A1C is 6.3 which is an improvement from 7.8 at last check.  We will continue on Steglatro and continue to improve diet and exercise  3. Essential (primary) hypertension Lotrel combination pill was too expensive therefore patient reverted back to benazepril 10 mg dosing and blood pressure remains uncontrolled.  We will send a new prescription for benazepril 20 mg and if still uncontrolled we will add in hydrochlorothiazide as a separate medication  given combo pill is too expensive - benazepril (LOTENSIN) 20 MG tablet; Take 1 tablet (20 mg total) by mouth daily.  Dispense: 90 tablet; Refill: 3  4. Generalized anxiety disorder Continue Zoloft daily and use Xanax only as needed  5. Primary localized osteoarthrosis Refill of tramadol sent - traMADol (ULTRAM) 50 MG tablet; Take one tab po bid for pain prn only  Dispense: 60 tablet; Refill: 1  6. Vitamin D deficiency - VITAMIN D 25 Hydroxy (Vit-D Deficiency, Fractures)  7. B12 deficiency - B12 and Folate Panel  8. Dysuria - UA/M w/rflx Culture, Routine - Microscopic Examination - Urine Culture, Reflex  9. Other fatigue - CBC w/Diff/Platelet - Comprehensive metabolic panel - Lipid Panel With LDL/HDL Ratio - TSH + free T4 - VITAMIN D 25 Hydroxy (Vit-D Deficiency, Fractures) - Iron, TIBC and Ferritin Panel - B12 and Folate Panel   General Counseling: Jacoya verbalizes understanding of the findings of todays visit and agrees with plan of treatment. I have discussed any further diagnostic evaluation that may be needed or ordered today. We also reviewed her medications today. she has been encouraged to call the office with any questions or concerns that should arise related to todays visit.    Counseling:    Orders Placed This Encounter  Procedures  . Microscopic Examination  . Urine Culture, Reflex  . UA/M w/rflx Culture, Routine  . CBC w/Diff/Platelet  . Comprehensive metabolic panel  . Lipid Panel With LDL/HDL Ratio  . TSH + free T4  . VITAMIN D 25 Hydroxy (Vit-D Deficiency, Fractures)  . Iron, TIBC and Ferritin Panel  . B12 and Folate Panel  . POCT HgB A1C    Meds ordered this encounter  Medications  . benazepril (LOTENSIN) 20 MG tablet    Sig: Take 1 tablet (20 mg total) by mouth daily.    Dispense:  90 tablet    Refill:  3  . traMADol (ULTRAM) 50 MG tablet    Sig: Take one tab po bid for pain prn only    Dispense:  60 tablet    Refill:  1    This  patient was seen by Drema Dallas, PA-C in collaboration with Dr. Clayborn Bigness as a part of collaborative care agreement.  Total time spent:30 Minutes  Time spent includes review of chart, medications, test results, and follow up plan with the patient.     Lavera Guise, MD  Internal Medicine

## 2020-11-17 LAB — UA/M W/RFLX CULTURE, ROUTINE
Bilirubin, UA: NEGATIVE
Ketones, UA: NEGATIVE
Leukocytes,UA: NEGATIVE
Nitrite, UA: NEGATIVE
Protein,UA: NEGATIVE
RBC, UA: NEGATIVE
Specific Gravity, UA: >=1.03 — AB (ref 1.005–1.030)
Urobilinogen, Ur: 1 mg/dL (ref 0.2–1.0)
pH, UA: 5.5 (ref 5.0–7.5)

## 2020-11-17 LAB — MICROSCOPIC EXAMINATION
Casts: NONE SEEN /lpf
Epithelial Cells (non renal): >10 /hpf — AB (ref 0–10)

## 2020-11-17 LAB — URINE CULTURE, REFLEX

## 2020-12-12 NOTE — Telephone Encounter (Signed)
This is non-emergent it can be first available

## 2020-12-12 NOTE — Telephone Encounter (Signed)
Called and left voicemail for patient to call back to be scheduled. 

## 2020-12-21 ENCOUNTER — Other Ambulatory Visit: Payer: Self-pay | Admitting: Adult Health

## 2020-12-21 DIAGNOSIS — I10 Essential (primary) hypertension: Secondary | ICD-10-CM

## 2021-01-02 ENCOUNTER — Telehealth: Payer: Self-pay

## 2021-01-02 NOTE — Telephone Encounter (Signed)
Spoke with need diabetic eye exam she will call her eye dr

## 2021-01-12 ENCOUNTER — Other Ambulatory Visit: Payer: Self-pay

## 2021-01-12 ENCOUNTER — Encounter: Payer: Self-pay | Admitting: Obstetrics and Gynecology

## 2021-01-12 ENCOUNTER — Ambulatory Visit: Payer: BC Managed Care – PPO | Admitting: Obstetrics and Gynecology

## 2021-01-12 VITALS — BP 126/90 | Ht 63.0 in | Wt 216.0 lb

## 2021-01-12 DIAGNOSIS — N761 Subacute and chronic vaginitis: Secondary | ICD-10-CM

## 2021-01-12 DIAGNOSIS — B373 Candidiasis of vulva and vagina: Secondary | ICD-10-CM

## 2021-01-12 DIAGNOSIS — B3731 Acute candidiasis of vulva and vagina: Secondary | ICD-10-CM

## 2021-01-12 MED ORDER — FLUCONAZOLE 150 MG PO TABS: 150.0000 mg | ORAL_TABLET | ORAL | 0 refills | Status: DC

## 2021-01-12 NOTE — Progress Notes (Signed)
Obstetrics & Gynecology Office Visit   Chief Complaint:  Chief Complaint  Patient presents with   Vaginal Irritation    Swelling, itchiness, no discharge    History of Present Illness: 60 y.o. female with a history of prior recurrent candida who presents with recurrence of symptoms of vaginal itching and swelling.  Has previously completed a weekly diflucan course for 4 weeks.  She reports no change in medications, soaps, or recent antibiotic exposures.  Most recent evaluation was positive for candida albicans 08/26/2020.  She was also started on vaginal Estrogen to treat vaginal atrophy which may be contributing to her symptoms.  Her past medical history is notable for DM2.  No associated vaginal discharge or bleeding noted by patient.  Symptoms have been present for the past week.  She did have improvement in symptoms after initial diflucan course.   Review of Systems: Review of Systems  Constitutional: Negative.   Genitourinary: Negative.   Skin:  Positive for itching and rash.    Past Medical History:  Past Medical History:  Diagnosis Date   Anxiety    Asthma    Depression    Environmental allergies    Hyperlipidemia    Hypertension    Rheumatoid arthritis ([place])    Sleep apnea 2019   Borderline   Type 2 diabetes mellitus ([place])     Past Surgical History:  Past Surgical History:  Procedure Laterality Date   CHOLECYSTECTOMY  2000   colon polyectomy     COLONOSCOPY WITH PROPOFOL N/A 03/31/2018   Procedure: COLONOSCOPY WITH PROPOFOL;  Surgeon: Jonathon Bellows, MD;  Location: [facility] ENDOSCOPY;  Service: Gastroenterology;  Laterality: N/A;   DENTAL SURGERY     FOOT SURGERY      Gynecologic History: No LMP recorded. Patient is postmenopausal.  Obstetric History: G40P1011  Family History:  Family History  Problem Relation Age of Onset   Diabetes Mother    COPD Mother    Uterine cancer Mother 3   Cancer Sister 45       Bile Duct   Breast cancer Neg Hx     Social  History:  Social History   Socioeconomic History   Marital status: Married    Spouse name: Not on file   Number of children: Not on file   Years of education: Not on file   Highest education level: Not on file  Occupational History   Not on file  Tobacco Use   Smoking status: Never   Smokeless tobacco: Never  Vaping Use   Vaping Use: Never used  Substance and Sexual Activity   Alcohol use: Yes    Comment: occ.   Drug use: No   Sexual activity: Yes    Birth control/protection: Post-menopausal  Other Topics Concern   Not on file  Social History Narrative   Not on file   Social Determinants of Health   Financial Resource Strain: Not on file  Food Insecurity: Not on file  Transportation Needs: Not on file  Physical Activity: Not on file  Stress: Not on file  Social Connections: Not on file  Intimate Partner Violence: Not on file    Allergies:  Allergies  Allergen Reactions   Azithromycin Hives   Contrast Media [Iodinated Diagnostic Agents]     Medications: Prior to Admission medications   Medication Sig Start Date End Date Taking? Authorizing Provider  ALPRAZolam Duanne Moron) 0.5 MG tablet Take half to one tab as needed a day ( 3 months supply 09/13/20  Yes Lavera Guise, MD  benazepril (LOTENSIN) 20 MG tablet Take 1 tablet (20 mg total) by mouth daily. 11/14/20  Yes McDonough, Si Gaul, PA-C  ertugliflozin L-PyroglutamicAc (STEGLATRO) 5 MG TABS tablet Take 1 tablet (5 mg total) by mouth daily before breakfast. E11.65 07/19/20  Yes Lavera Guise, MD  estradiol (ESTRACE VAGINAL) 0.1 MG/GM vaginal cream Place 1 g vaginally 3 (three) times a week. 10/19/20  Yes Malachy Mood, MD  fluconazole (DIFLUCAN) 150 MG tablet Take 1 tablet (150 mg total) by mouth once a week for 4 doses. 01/12/21 02/03/21 Yes Malachy Mood, MD  nystatin cream (MYCOSTATIN) Apply 1 application topically 2 (two) times daily. 10/19/20  Yes Malachy Mood, MD  omeprazole (PRILOSEC) 40 MG capsule Take 1  capsule (40 mg total) by mouth 2 (two) times daily. 03/18/20  Yes Scarboro, Audie Clear, NP  sertraline (ZOLOFT) 100 MG tablet Take 1 tablet (100 mg total) by mouth daily. 08/16/20  Yes Lavera Guise, MD  traMADol Veatrice Bourbon) 50 MG tablet Take one tab po bid for pain prn only 11/14/20  Yes McDonough, Si Gaul, PA-C    Physical Exam Vitals:  Vitals:   01/12/21 1457  BP: 126/90   No LMP recorded. Patient is postmenopausal.  General: NAD HEENT: normocephalic, anicteric Pulmonary: No increased work of breathing Genitourinary:  External: Normal external female genitalia.  Normal urethral meatus, normal  Bartholin's and Skene's glands.    Vagina: Normal vaginal mucosa, no evidence of prolapse.    Rectal: deferred  Lymphatic: no evidence of inguinal lymphadenopathy Extremities: no edema, erythema, or tenderness Neurologic: Grossly intact Psychiatric: mood appropriate, affect full  Female chaperone present for pelvic  portions of the physical exam  Assessment: 60 y.o. G2P1011 with recurrent candida symptoms  Plan: Problem List Items Addressed This Visit   None Visit Diagnoses     Vaginal candida    -  Primary   Relevant Medications   fluconazole (DIFLUCAN) 150 MG tablet   Other Relevant Orders   NuSwab BV and Candida, NAA   Chronic vaginitis       Relevant Orders   NuSwab BV and Candida, NAA      1) Recurrent candida symptoms -Nuswab sent - Rx diflucan 4 week course, 6 day samples of Hylafem also given - continue vaginal estrogen - no evidence of lichen sclerosus or other skin changes.  2) Return if symptoms worsen or fail to improve.   Malachy Mood, MD, [facility], [facility] 01/12/2021, 3:19 PM

## 2021-01-15 LAB — NUSWAB BV AND CANDIDA, NAA
Candida albicans, NAA: NEGATIVE
Candida glabrata, NAA: NEGATIVE

## 2021-02-05 DIAGNOSIS — J069 Acute upper respiratory infection, unspecified: Secondary | ICD-10-CM | POA: Diagnosis not present

## 2021-02-05 DIAGNOSIS — Z20822 Contact with and (suspected) exposure to covid-19: Secondary | ICD-10-CM | POA: Diagnosis not present

## 2021-02-15 ENCOUNTER — Other Ambulatory Visit: Payer: Self-pay

## 2021-02-15 ENCOUNTER — Encounter: Payer: Self-pay | Admitting: Nurse Practitioner

## 2021-02-15 ENCOUNTER — Other Ambulatory Visit: Payer: Self-pay | Admitting: Obstetrics and Gynecology

## 2021-02-15 ENCOUNTER — Ambulatory Visit: Payer: BC Managed Care – PPO | Admitting: Nurse Practitioner

## 2021-02-15 VITALS — BP 140/88 | HR 72 | Temp 97.3°F | Resp 16 | Ht 63.0 in | Wt 216.0 lb

## 2021-02-15 DIAGNOSIS — M1991 Primary osteoarthritis, unspecified site: Secondary | ICD-10-CM | POA: Diagnosis not present

## 2021-02-15 DIAGNOSIS — E1165 Type 2 diabetes mellitus with hyperglycemia: Secondary | ICD-10-CM

## 2021-02-15 DIAGNOSIS — Z79899 Other long term (current) drug therapy: Secondary | ICD-10-CM

## 2021-02-15 DIAGNOSIS — F411 Generalized anxiety disorder: Secondary | ICD-10-CM | POA: Diagnosis not present

## 2021-02-15 LAB — POCT GLYCOSYLATED HEMOGLOBIN (HGB A1C): Hemoglobin A1C: 6.8 % — AB (ref 4.0–5.6)

## 2021-02-15 MED ORDER — STEGLATRO 5 MG PO TABS: 5.0000 mg | ORAL_TABLET | Freq: Every day | ORAL | 3 refills | Status: DC

## 2021-02-15 MED ORDER — TRAMADOL HCL 50 MG PO TABS
ORAL_TABLET | ORAL | 1 refills | Status: DC
Start: 2021-02-15 — End: 2021-05-09

## 2021-02-15 MED ORDER — SERTRALINE HCL 100 MG PO TABS: 100.0000 mg | ORAL_TABLET | Freq: Every day | ORAL | 1 refills | Status: DC

## 2021-02-15 NOTE — Progress Notes (Signed)
[facility] [place], [place] [address]  Internal MEDICINE  Office Visit Note  Patient Name: Kristen Sparks  [id number]  [id number]  Date of Service: 02/15/2021  Chief Complaint  Patient presents with   Follow-up    Refills    Depression   Diabetes   Sleep Apnea   Hyperlipidemia   Hypertension   Anxiety   Asthma    HPI Kristen Sparks presents for a follow up visit for diabetes and medication refills. A1C is 6.8 today which is elevated compared to may 2022. She reports that she has been eating a lot of high-carb foods including sandwiches and pastas.  --blood pressure elevated, improved when rechecked, see vitals.  -requesting refills of tramadol and sertraline.     Current Medication: Outpatient Encounter Medications as of 02/15/2021  Medication Sig   ALPRAZolam (XANAX) 0.5 MG tablet Take half to one tab as needed a day ( 3 months supply   benazepril (LOTENSIN) 20 MG tablet Take 1 tablet (20 mg total) by mouth daily.   estradiol (ESTRACE VAGINAL) 0.1 MG/GM vaginal cream Place 1 g vaginally 3 (three) times a week.   nystatin cream (MYCOSTATIN) Apply 1 application topically 2 (two) times daily.   omeprazole (PRILOSEC) 40 MG capsule Take 1 capsule (40 mg total) by mouth 2 (two) times daily.   [DISCONTINUED] ertugliflozin L-PyroglutamicAc (STEGLATRO) 5 MG TABS tablet Take 1 tablet (5 mg total) by mouth daily before breakfast. E11.65   [DISCONTINUED] sertraline (ZOLOFT) 100 MG tablet Take 1 tablet (100 mg total) by mouth daily.   [DISCONTINUED] traMADol (ULTRAM) 50 MG tablet Take one tab po bid for pain prn only   ertugliflozin L-PyroglutamicAc (STEGLATRO) 5 MG TABS tablet Take 1 tablet (5 mg total) by mouth daily before breakfast. E11.65   sertraline (ZOLOFT) 100 MG tablet Take 1 tablet (100 mg total) by mouth daily.   traMADol (ULTRAM) 50 MG tablet Take one tab po bid for pain prn only   No facility-administered encounter medications on file as of 02/15/2021.     Surgical History: Past Surgical History:  Procedure Laterality Date   CHOLECYSTECTOMY  2000   colon polyectomy     COLONOSCOPY WITH PROPOFOL N/A 03/31/2018   Procedure: COLONOSCOPY WITH PROPOFOL;  Surgeon: Jonathon Bellows, MD;  Location: [facility] ENDOSCOPY;  Service: Gastroenterology;  Laterality: N/A;   DENTAL SURGERY     FOOT SURGERY      Medical History: Past Medical History:  Diagnosis Date   Anxiety    Asthma    Depression    Environmental allergies    Hyperlipidemia    Hypertension    Rheumatoid arthritis ([place])    Sleep apnea 2019   Borderline   Type 2 diabetes mellitus ([place])     Family History: Family History  Problem Relation Age of Onset   Diabetes Mother    COPD Mother    Uterine cancer Mother 74   Cancer Sister 62       Bile Duct   Breast cancer Neg Hx     Social History   Socioeconomic History   Marital status: Married    Spouse name: Not on file   Number of children: Not on file   Years of education: Not on file   Highest education level: Not on file  Occupational History   Not on file  Tobacco Use   Smoking status: Never   Smokeless tobacco: Never  Vaping Use   Vaping Use: Never used  Substance and Sexual  Activity   Alcohol use: Yes    Comment: occ.   Drug use: No   Sexual activity: Yes    Birth control/protection: Post-menopausal  Other Topics Concern   Not on file  Social History Narrative   Not on file   Social Determinants of Health   Financial Resource Strain: Not on file  Food Insecurity: Not on file  Transportation Needs: Not on file  Physical Activity: Not on file  Stress: Not on file  Social Connections: Not on file  Intimate Partner Violence: Not on file      Review of Systems  Constitutional:  Negative for chills, fatigue and unexpected weight change.  HENT:  Negative for congestion, rhinorrhea, sneezing and sore throat.   Eyes:  Negative for redness.  Respiratory:  Negative for cough, chest tightness and shortness  of breath.   Cardiovascular:  Negative for chest pain and palpitations.  Gastrointestinal:  Negative for abdominal pain, constipation, diarrhea, nausea and vomiting.  Genitourinary:  Negative for dysuria and frequency.  Musculoskeletal:  Negative for arthralgias, back pain, joint swelling and neck pain.  Skin:  Negative for rash.  Neurological: Negative.  Negative for tremors and numbness.  Hematological:  Negative for adenopathy. Does not bruise/bleed easily.  Psychiatric/Behavioral:  Negative for behavioral problems (Depression), sleep disturbance and suicidal ideas. The patient is not nervous/anxious.    Vital Signs: BP 140/88 Comment: 152/77  Pulse 72   Temp (!) 97.3 F (36.3 C)   Resp 16   Ht '5\' 3"'$  (1.6 m)   Wt 216 lb (98 kg)   SpO2 98%   BMI 38.26 kg/m    Physical Exam Vitals reviewed.  Constitutional:      General: She is not in acute distress.    Appearance: Normal appearance. She is obese. She is not ill-appearing.  HENT:     Head: Normocephalic and atraumatic.  Eyes:     Extraocular Movements: Extraocular movements intact.     Pupils: Pupils are equal, round, and reactive to light.  Cardiovascular:     Rate and Rhythm: Normal rate and regular rhythm.  Neurological:     Mental Status: She is alert and oriented to person, place, and time.     Cranial Nerves: No cranial nerve deficit.     Coordination: Coordination normal.     Gait: Gait normal.  Psychiatric:        Mood and Affect: Mood normal.        Behavior: Behavior normal.     Assessment/Plan: 1. Uncontrolled type 2 diabetes mellitus with hyperglycemia (HCC) A1C has increased to 6.8, discussed diet and lifestyle modifications in detail. Continue steglatro as prescribed. Refill ordered.  - POCT HgB A1C - ertugliflozin L-PyroglutamicAc (STEGLATRO) 5 MG TABS tablet; Take 1 tablet (5 mg total) by mouth daily before breakfast. E11.65  Dispense: 30 tablet; Refill: 3  2. Primary localized  osteoarthrosis Patient uses tramadol when OTC NSAIDs are not controlling the pain enough, refill ordered.  - traMADol (ULTRAM) 50 MG tablet; Take one tab po bid for pain prn only  Dispense: 60 tablet; Refill: 1  3. Generalized anxiety disorder Stable, continue sertraline as prescribed.  - sertraline (ZOLOFT) 100 MG tablet; Take 1 tablet (100 mg total) by mouth daily.  Dispense: 90 tablet; Refill: 1   General Counseling: Kristen Sparks verbalizes understanding of the findings of todays visit and agrees with plan of treatment. I have discussed any further diagnostic evaluation that may be needed or ordered today. We also reviewed her  medications today. she has been encouraged to call the office with any questions or concerns that should arise related to todays visit.    Orders Placed This Encounter  Procedures   POCT HgB A1C    Meds ordered this encounter  Medications   traMADol (ULTRAM) 50 MG tablet    Sig: Take one tab po bid for pain prn only    Dispense:  60 tablet    Refill:  1   sertraline (ZOLOFT) 100 MG tablet    Sig: Take 1 tablet (100 mg total) by mouth daily.    Dispense:  90 tablet    Refill:  1   ertugliflozin L-PyroglutamicAc (STEGLATRO) 5 MG TABS tablet    Sig: Take 1 tablet (5 mg total) by mouth daily before breakfast. E11.65    Dispense:  30 tablet    Refill:  3    PA approved from 07/17/20 to 07/16/2021    Return in about 1 month (around 03/18/2021) for F/U, Weight loss, Kristen Sparks PCP.   Total time spent:30 Minutes Time spent includes review of chart, medications, test results, and follow up plan with the patient.   [place] Controlled Substance Database was reviewed by me.  This patient was seen by [name], FNP-C in collaboration with Dr. Clayborn Bigness as a part of collaborative care agreement.   Mazzie Brodrick R. Valetta Fuller, MSN, FNP-C Internal medicine

## 2021-02-16 ENCOUNTER — Ambulatory Visit: Payer: BC Managed Care – PPO | Admitting: Physician Assistant

## 2021-02-28 MED ORDER — SERTRALINE HCL 100 MG PO TABS: 100.0000 mg | ORAL_TABLET | Freq: Every day | ORAL | 1 refills | Status: DC

## 2021-02-28 NOTE — Addendum Note (Signed)
Addended by: Lavera Guise on: 02/28/2021 09:16 AM   Modules accepted: Orders

## 2021-03-20 ENCOUNTER — Encounter: Payer: BC Managed Care – PPO | Admitting: Hospice and Palliative Medicine

## 2021-05-01 NOTE — Progress Notes (Signed)
Kristen Osgood, NP   Chief Complaint  Patient presents with   Vaginal Itching    Irritation, no discharge or odor. Pain during intercourse    HPI:      Ms. Kristen Sparks is a 60 y.o. G2P1011 whose LMP was No LMP recorded. Patient is postmenopausal., presents today for vaginal itching/irritation, no d/c, odor, burning, for past wk. Itching is ext and severe, can't help but scratch. Also has dyspareunia with itching; no dyspareunia sx otherwise. Sx improve with OTC hydrocortisone crm. Has a hx of recurrent yeast vag sx with itching, improved with wkly diflucan. Sx recur once she stops it. Wears pads daily for urin leakage, hx of uncontrolled type 2 DM. Using dryer sheets in clothes, unscented soap, cotton underwear. No urin sx.   Saw Dr. Georgianne Sparks 7/22 who resumed diflucan wkly for 4 wks, hylafem with sx change, vag ERT for a few days; neg BV and yeast on nuswab 7/22 NOTE with Dr. Reyne Sparks candida who presents with recurrence of symptoms of vaginal itching and swelling.  Has previously completed a weekly diflucan course for 4 weeks.  She reports no change in medications, soaps, or recent antibiotic exposures.  Most recent evaluation was positive for candida albicans 08/26/2020.  She was also started on vaginal Estrogen to treat vaginal atrophy which may be contributing to her symptoms.  Her past medical history is notable for DM2.   Past Medical History:  Diagnosis Date   Anxiety    Asthma    Depression    Environmental allergies    Hyperlipidemia    Hypertension    Rheumatoid arthritis ([place])    Sleep apnea 2019   Borderline   Type 2 diabetes mellitus ([place])     Past Surgical History:  Procedure Laterality Date   CHOLECYSTECTOMY  2000   colon polyectomy     COLONOSCOPY WITH PROPOFOL N/A 03/31/2018   Procedure: COLONOSCOPY WITH PROPOFOL;  Surgeon: Kristen Bellows, MD;  Location: [facility] ENDOSCOPY;  Service: Gastroenterology;  Laterality: N/A;   DENTAL SURGERY     FOOT SURGERY       Family History  Problem Relation Age of Onset   Diabetes Mother    COPD Mother    Uterine cancer Mother 61   Cancer Sister 26       Bile Duct   Stroke Sister    Breast cancer Neg Hx     Social History   Socioeconomic History   Marital status: Married    Spouse name: Not on file   Number of children: Not on file   Years of education: Not on file   Highest education level: Not on file  Occupational History   Not on file  Tobacco Use   Smoking status: Never   Smokeless tobacco: Never  Vaping Use   Vaping Use: Never used  Substance and Sexual Activity   Alcohol use: Yes    Comment: occ.   Drug use: No   Sexual activity: Yes    Birth control/protection: Post-menopausal  Other Topics Concern   Not on file  Social History Narrative   Not on file   Social Determinants of Health   Financial Resource Strain: Not on file  Food Insecurity: Not on file  Transportation Needs: Not on file  Physical Activity: Not on file  Stress: Not on file  Social Connections: Not on file  Intimate Partner Violence: Not on file    Outpatient Medications Prior to Visit  Medication Sig Dispense Refill  ALPRAZolam (XANAX) 0.5 MG tablet Take half to one tab as needed a day ( 3 months supply 45 tablet 0   benazepril (LOTENSIN) 20 MG tablet Take 1 tablet (20 mg total) by mouth daily. 90 tablet 3   ertugliflozin L-PyroglutamicAc (STEGLATRO) 5 MG TABS tablet Take 1 tablet (5 mg total) by mouth daily before breakfast. E11.65 30 tablet 3   nystatin cream (MYCOSTATIN) Apply 1 application topically 2 (two) times daily. 30 g 1   omeprazole (PRILOSEC) 40 MG capsule Take 1 capsule (40 mg total) by mouth 2 (two) times daily. 60 capsule 3   sertraline (ZOLOFT) 100 MG tablet Take 1 tablet (100 mg total) by mouth daily. 90 tablet 1   traMADol (ULTRAM) 50 MG tablet Take one tab po bid for pain prn only 60 tablet 1   estradiol (ESTRACE VAGINAL) 0.1 MG/GM vaginal cream Place 1 g vaginally 3 (three)  times a week. 42.5 g 3   fluconazole (DIFLUCAN) 150 MG tablet TAKE 1 TABLET BY MOUTH ONCE A WEEK FOR 4 DOSES 4 tablet 0   No facility-administered medications prior to visit.      ROS:  Review of Systems  Constitutional:  Negative for fever.  Gastrointestinal:  Negative for blood in stool, constipation, diarrhea, nausea and vomiting.  Genitourinary:  Positive for dyspareunia. Negative for dysuria, flank pain, frequency, hematuria, urgency, vaginal bleeding, vaginal discharge and vaginal pain.  Musculoskeletal:  Negative for back pain.  Skin:  Negative for rash.  BREAST: No symptoms   OBJECTIVE:   Vitals:  BP (!) 150/90   Ht 5\' 3"  (1.6 m)   Wt 217 lb (98.4 kg)   BMI 38.44 kg/m   Physical Exam Vitals reviewed.  Constitutional:      Appearance: She is well-developed.  Pulmonary:     Effort: Pulmonary effort is normal.  Genitourinary:    Pubic Area: No rash.      Labia:        Right: No rash, tenderness or lesion.        Left: No rash, tenderness or lesion.      Vagina: Normal. No vaginal discharge, erythema or tenderness.     Cervix: Normal.     Uterus: Normal. Not enlarged and not tender.      Adnexa: Right adnexa normal and left adnexa normal.       Right: No mass or tenderness.         Left: No mass or tenderness.       Comments: BILAT LABIA MAJORA AND MINORA WITH MILD IRRITATION; MILD SWELLING; NO LESIONS/FISSURES; NO EVID OF LS Musculoskeletal:        General: Normal range of motion.     Cervical back: Normal range of motion.  Skin:    General: Skin is warm and dry.  Neurological:     General: No focal deficit present.     Mental Status: She is alert and oriented to person, place, and time.  Psychiatric:        Mood and Affect: Mood normal.        Behavior: Behavior normal.        Thought Content: Thought content normal.        Judgment: Judgment normal.    Results: Results for orders placed or performed in visit on 05/02/21 (from the past 24 hour(s))   POCT Wet Prep with KOH     Status: Normal   Collection Time: 05/02/21 10:18 AM  Result Value Ref Range   Trichomonas,  UA Negative    Clue Cells Wet Prep HPF POC neg    Epithelial Wet Prep HPF POC     Yeast Wet Prep HPF POC neg    Bacteria Wet Prep HPF POC     RBC Wet Prep HPF POC     WBC Wet Prep HPF POC     KOH Prep POC Negative Negative     Assessment/Plan: Vaginal itching - Plan: fluconazole (DIFLUCAN) 150 MG tablet, POCT Wet Prep with KOH; neg wet prep (neg nuswab 7/22). Sx mostly ext anyway and prob fungal. Rx diflucan episodically prn. Discussed prevention with eliminating pad use, dove sens skin soap, line dry underwear, keep dry, improve DM control, add probiotics. Cool compresses/hydrocortisone crm for itch. Discussed itch-scratch rxn. F/u prn.    Meds ordered this encounter  Medications   fluconazole (DIFLUCAN) 150 MG tablet    Sig: Take 1 tablet (150 mg total) by mouth once for 1 dose. May repeat in 3 days if still having symptoms    Dispense:  2 tablet    Refill:  2    Order Specific Question:   Supervising Provider    Answer:   Gae Dry [[id number]]      Return if symptoms worsen or fail to improve.  Quintasia Theroux B. Lawernce Earll, PA-C 05/02/2021 10:20 AM

## 2021-05-02 ENCOUNTER — Encounter: Payer: Self-pay | Admitting: Obstetrics and Gynecology

## 2021-05-02 ENCOUNTER — Other Ambulatory Visit: Payer: Self-pay

## 2021-05-02 ENCOUNTER — Ambulatory Visit: Payer: BC Managed Care – PPO | Admitting: Obstetrics and Gynecology

## 2021-05-02 VITALS — BP 150/90 | Ht 63.0 in | Wt 217.0 lb

## 2021-05-02 DIAGNOSIS — N898 Other specified noninflammatory disorders of vagina: Secondary | ICD-10-CM | POA: Diagnosis not present

## 2021-05-02 LAB — POCT WET PREP WITH KOH
Clue Cells Wet Prep HPF POC: NEGATIVE
KOH Prep POC: NEGATIVE
Trichomonas, UA: NEGATIVE
Yeast Wet Prep HPF POC: NEGATIVE

## 2021-05-02 MED ORDER — FLUCONAZOLE 150 MG PO TABS: 150.0000 mg | ORAL_TABLET | Freq: Once | ORAL | 2 refills | Status: AC

## 2021-05-02 NOTE — Patient Instructions (Addendum)
I value your feedback and you entrusting [place] with your care. If you get a [place] patient survey, I would appreciate you taking the time to let us know about your experience today. Thank you!  HEALTHY VAGINAL HYGIENE  AVOID   Panytyhose Synthetic underwear (wear COTTON underwear)  Tight pants/jeans Thongs Pantyliners Scented soaps/shower gels (use Dove Sensitive Skin soap or water to clean) Bubble bath/bath bombs Scented detergents  ALL dryer sheets (line dry underwear if using them on your other clothing) Feminine sprays/douches  ADD probiotics daily

## 2021-05-09 ENCOUNTER — Ambulatory Visit (INDEPENDENT_AMBULATORY_CARE_PROVIDER_SITE_OTHER): Payer: BC Managed Care – PPO | Admitting: Nurse Practitioner

## 2021-05-09 ENCOUNTER — Encounter: Payer: Self-pay | Admitting: Nurse Practitioner

## 2021-05-09 ENCOUNTER — Other Ambulatory Visit: Payer: Self-pay

## 2021-05-09 VITALS — BP 133/87 | HR 71 | Temp 98.3°F | Resp 16 | Ht 63.0 in | Wt 216.8 lb

## 2021-05-09 DIAGNOSIS — Z1211 Encounter for screening for malignant neoplasm of colon: Secondary | ICD-10-CM | POA: Diagnosis not present

## 2021-05-09 DIAGNOSIS — I10 Essential (primary) hypertension: Secondary | ICD-10-CM | POA: Diagnosis not present

## 2021-05-09 DIAGNOSIS — E1165 Type 2 diabetes mellitus with hyperglycemia: Secondary | ICD-10-CM | POA: Diagnosis not present

## 2021-05-09 DIAGNOSIS — Z1212 Encounter for screening for malignant neoplasm of rectum: Secondary | ICD-10-CM

## 2021-05-09 DIAGNOSIS — M1991 Primary osteoarthritis, unspecified site: Secondary | ICD-10-CM

## 2021-05-09 DIAGNOSIS — F411 Generalized anxiety disorder: Secondary | ICD-10-CM

## 2021-05-09 MED ORDER — TRAMADOL HCL 50 MG PO TABS: ORAL_TABLET | ORAL | 2 refills | Status: DC

## 2021-05-09 MED ORDER — ALPRAZOLAM 0.5 MG PO TABS: ORAL_TABLET | ORAL | 2 refills | Status: DC

## 2021-05-09 NOTE — Progress Notes (Signed)
[facility] [place], [place] [address]  Internal MEDICINE  Office Visit Note  Patient Name: Kristen Sparks  [id number]  [id number]  Date of Service: 05/09/2021  Chief Complaint  Patient presents with   Follow-up   Depression   Diabetes   Hyperlipidemia   Hypertension   Anxiety   Asthma    HPI Kjersten presents for a follow up visit for medication refills. At her previous office visit, weight loss was also discussed and she was interested in getting help with weight loss. Today, she states that she is working on diet modifications and losing weight on her own. She does not want help with weight loss at this time. Her blood pressure is adequately controlled at this time. Her last A1C was 6.8. she is currently taking steglatro. She is not due to have her A1C checked again for a few months. Since her A1C has remained under 7.0, she gets her A1C checked every 6 months. She is also due for a routine screening colonoscopy and is requesting a referral.    Current Medication: Outpatient Encounter Medications as of 05/09/2021  Medication Sig   benazepril (LOTENSIN) 20 MG tablet Take 1 tablet (20 mg total) by mouth daily.   ertugliflozin L-PyroglutamicAc (STEGLATRO) 5 MG TABS tablet Take 1 tablet (5 mg total) by mouth daily before breakfast. E11.65   nystatin cream (MYCOSTATIN) Apply 1 application topically 2 (two) times daily.   omeprazole (PRILOSEC) 40 MG capsule Take 1 capsule (40 mg total) by mouth 2 (two) times daily.   sertraline (ZOLOFT) 100 MG tablet Take 1 tablet (100 mg total) by mouth daily.   [DISCONTINUED] ALPRAZolam (XANAX) 0.5 MG tablet Take half to one tab as needed a day ( 3 months supply   [DISCONTINUED] traMADol (ULTRAM) 50 MG tablet Take one tab po bid for pain prn only   ALPRAZolam (XANAX) 0.5 MG tablet Take half to one tab as needed a day ( 3 months supply   traMADol (ULTRAM) 50 MG tablet Take one tab po bid for pain prn only   No  facility-administered encounter medications on file as of 05/09/2021.    Surgical History: Past Surgical History:  Procedure Laterality Date   CHOLECYSTECTOMY  2000   colon polyectomy     COLONOSCOPY WITH PROPOFOL N/A 03/31/2018   Procedure: COLONOSCOPY WITH PROPOFOL;  Surgeon: Jonathon Bellows, MD;  Location: [facility] ENDOSCOPY;  Service: Gastroenterology;  Laterality: N/A;   DENTAL SURGERY     FOOT SURGERY      Medical History: Past Medical History:  Diagnosis Date   Anxiety    Asthma    Depression    Environmental allergies    Hyperlipidemia    Hypertension    Rheumatoid arthritis ([place])    Sleep apnea 2019   Borderline   Type 2 diabetes mellitus ([place])     Family History: Family History  Problem Relation Age of Onset   Diabetes Mother    COPD Mother    Uterine cancer Mother 27   Cancer Sister 70       Bile Duct   Stroke Sister    Breast cancer Neg Hx     Social History   Socioeconomic History   Marital status: Married    Spouse name: Not on file   Number of children: Not on file   Years of education: Not on file   Highest education level: Not on file  Occupational History   Not on file  Tobacco Use  Smoking status: Never   Smokeless tobacco: Never  Vaping Use   Vaping Use: Never used  Substance and Sexual Activity   Alcohol use: Yes    Comment: occ.   Drug use: No   Sexual activity: Yes    Birth control/protection: Post-menopausal  Other Topics Concern   Not on file  Social History Narrative   Not on file   Social Determinants of Health   Financial Resource Strain: Not on file  Food Insecurity: Not on file  Transportation Needs: Not on file  Physical Activity: Not on file  Stress: Not on file  Social Connections: Not on file  Intimate Partner Violence: Not on file      Review of Systems  Constitutional:  Negative for chills, fatigue and unexpected weight change.  HENT:  Negative for congestion, rhinorrhea, sneezing and sore throat.   Eyes:   Negative for redness.  Respiratory:  Negative for cough, chest tightness and shortness of breath.   Cardiovascular:  Negative for chest pain and palpitations.  Gastrointestinal:  Negative for abdominal pain, constipation, diarrhea, nausea and vomiting.  Genitourinary:  Negative for dysuria and frequency.  Musculoskeletal:  Negative for arthralgias, back pain, joint swelling and neck pain.  Skin:  Negative for rash.  Neurological: Negative.  Negative for tremors and numbness.  Hematological:  Negative for adenopathy. Does not bruise/bleed easily.  Psychiatric/Behavioral:  Negative for behavioral problems (Depression), sleep disturbance and suicidal ideas. The patient is not nervous/anxious.    Vital Signs: BP 133/87   Pulse 71   Temp 98.3 F (36.8 C)   Resp 16   Ht 5\' 3"  (1.6 m)   Wt 216 lb 12.8 oz (98.3 kg)   SpO2 97%   BMI 38.40 kg/m    Physical Exam Vitals reviewed.  Constitutional:      General: She is not in acute distress.    Appearance: Normal appearance. She is obese. She is not ill-appearing.  HENT:     Head: Normocephalic and atraumatic.  Eyes:     Pupils: Pupils are equal, round, and reactive to light.  Cardiovascular:     Rate and Rhythm: Normal rate and regular rhythm.  Pulmonary:     Effort: Pulmonary effort is normal. No respiratory distress.  Neurological:     Mental Status: She is alert and oriented to person, place, and time.     Cranial Nerves: No cranial nerve deficit.     Coordination: Coordination normal.     Gait: Gait normal.  Psychiatric:        Mood and Affect: Mood normal.        Behavior: Behavior normal.       Assessment/Plan: 1. Uncontrolled type 2 diabetes mellitus with hyperglycemia (HCC) Stable, continue current medication, repeat A1C in 3 months.   2. Essential (primary) hypertension Stable with current medication, continue as prescribed.   3. Primary localized osteoarthrosis Manageable with tramadol, refill ordered.  -  traMADol (ULTRAM) 50 MG tablet; Take one tab po bid for pain prn only  Dispense: 60 tablet; Refill: 2  4. Screening for colorectal cancer Referred to GI - Ambulatory referral to Gastroenterology  5. Generalized anxiety disorder Stable, refill ordered. UDS at next office visit.  - ALPRAZolam (XANAX) 0.5 MG tablet; Take half to one tab as needed a day ( 3 months supply  Dispense: 30 tablet; Refill: 2   General Counseling: Takera verbalizes understanding of the findings of todays visit and agrees with plan of treatment. I have discussed any  further diagnostic evaluation that may be needed or ordered today. We also reviewed her medications today. she has been encouraged to call the office with any questions or concerns that should arise related to todays visit.    Orders Placed This Encounter  Procedures   Ambulatory referral to Gastroenterology    Meds ordered this encounter  Medications   traMADol (ULTRAM) 50 MG tablet    Sig: Take one tab po bid for pain prn only    Dispense:  60 tablet    Refill:  2   ALPRAZolam (XANAX) 0.5 MG tablet    Sig: Take half to one tab as needed a day ( 3 months supply    Dispense:  30 tablet    Refill:  2    3 months supply    Return in 3 months (on 08/09/2021) for F/U, anxiety med refill, Recheck A1C, Romeka Scifres PCP.   Total time spent:30 Minutes Time spent includes review of chart, medications, test results, and follow up plan with the patient.   [place] Controlled Substance Database was reviewed by me.  This patient was seen by [name], FNP-C in collaboration with Dr. Clayborn Bigness as a part of collaborative care agreement.   Gidget Quizhpi R. Valetta Fuller, MSN, FNP-C Internal medicine

## 2021-05-10 ENCOUNTER — Encounter: Payer: Self-pay | Admitting: Nurse Practitioner

## 2021-06-06 DIAGNOSIS — E119 Type 2 diabetes mellitus without complications: Secondary | ICD-10-CM | POA: Diagnosis not present

## 2021-06-06 DIAGNOSIS — H5213 Myopia, bilateral: Secondary | ICD-10-CM | POA: Diagnosis not present

## 2021-06-22 ENCOUNTER — Ambulatory Visit: Payer: BC Managed Care – PPO | Admitting: Obstetrics and Gynecology

## 2021-06-27 ENCOUNTER — Other Ambulatory Visit (HOSPITAL_COMMUNITY)
Admission: RE | Admit: 2021-06-27 | Discharge: 2021-06-27 | Disposition: A | Discharge status: Home / Self Care | Payer: BC Managed Care – PPO | Source: Ambulatory Visit | Attending: Obstetrics and Gynecology | Admitting: Obstetrics and Gynecology

## 2021-06-27 ENCOUNTER — Ambulatory Visit (INDEPENDENT_AMBULATORY_CARE_PROVIDER_SITE_OTHER): Payer: BC Managed Care – PPO | Admitting: Obstetrics and Gynecology

## 2021-06-27 ENCOUNTER — Encounter: Payer: Self-pay | Admitting: Obstetrics and Gynecology

## 2021-06-27 ENCOUNTER — Other Ambulatory Visit: Payer: Self-pay

## 2021-06-27 VITALS — BP 138/86 | HR 83 | Ht 63.0 in | Wt 216.0 lb

## 2021-06-27 DIAGNOSIS — Z124 Encounter for screening for malignant neoplasm of cervix: Secondary | ICD-10-CM | POA: Diagnosis not present

## 2021-06-27 DIAGNOSIS — Z1239 Encounter for other screening for malignant neoplasm of breast: Secondary | ICD-10-CM

## 2021-06-27 DIAGNOSIS — N952 Postmenopausal atrophic vaginitis: Secondary | ICD-10-CM

## 2021-06-27 DIAGNOSIS — Z01419 Encounter for gynecological examination (general) (routine) without abnormal findings: Secondary | ICD-10-CM | POA: Diagnosis not present

## 2021-06-27 DIAGNOSIS — Z1211 Encounter for screening for malignant neoplasm of colon: Secondary | ICD-10-CM | POA: Diagnosis not present

## 2021-06-27 DIAGNOSIS — B3731 Acute candidiasis of vulva and vagina: Secondary | ICD-10-CM

## 2021-06-27 MED ORDER — FLUCONAZOLE 150 MG PO TABS: 150.0000 mg | ORAL_TABLET | Freq: Once | ORAL | 0 refills | Status: AC

## 2021-06-27 MED ORDER — ESTRADIOL 0.1 MG/GM VA CREA: 1.0000 | TOPICAL_CREAM | VAGINAL | 3 refills | Status: DC

## 2021-06-27 NOTE — Patient Instructions (Signed)
[facility] [address] [place] [address]  [facility] [place]  [address] [place] [address]  Phone: [phone]  

## 2021-06-27 NOTE — Progress Notes (Signed)
Gynecology Annual Exam  PCP: [name], NP  Chief Complaint:  Chief Complaint  Patient presents with   Gynecologic Exam    Annual - vaginal itching/rash. RM 5    History of Present Illness:Patient is a 61 y.o. G2P1011 presents for annual exam. The patient has no complaints today.   LMP: No LMP recorded. Patient is postmenopausal. No PMB  The patient is sexually active. She admits to dyspareunia.  The patient does perform self breast exams.  There is no notable family history of breast or ovarian cancer in her family.  The patient wears seatbelts: yes.   The patient has regular exercise: not asked.    The patient denies current symptoms of depression.     Review of Systems: Review of Systems  Constitutional:  Negative for chills and fever.  HENT:  Negative for congestion.   Respiratory:  Negative for cough and shortness of breath.   Cardiovascular:  Negative for chest pain and palpitations.  Gastrointestinal:  Negative for abdominal pain, constipation, diarrhea, heartburn, nausea and vomiting.  Genitourinary:  Negative for dysuria, frequency and urgency.  Skin:  Negative for itching and rash.  Neurological:  Negative for dizziness and headaches.  Endo/Heme/Allergies:  Negative for polydipsia.  Psychiatric/Behavioral:  Negative for depression.    Past Medical History:  Patient Active Problem List   Diagnosis Date Noted   Pneumonia due to COVID-19 virus 04/18/2020   Asthma    Hypokalemia    Acute respiratory failure with hypoxia (HCC)    Constipation 11/24/2019   Gastroesophageal reflux disease with esophagitis without hemorrhage 11/24/2019   Acute recurrent frontal sinusitis 10/07/2019   Diabetes mellitus without complication ([place]) 76/28/3151   Gastroenteritis 02/08/2019   Nausea 02/08/2019   Depression with anxiety 11/07/2018   Chest pain 09/26/2018   Primary localized osteoarthrosis 09/26/2018   Muscle pain 09/26/2018   Flu-like symptoms 07/09/2018    Acute upper respiratory infection 07/09/2018   Cough 07/09/2018   Wheezing 07/09/2018   Type 2 diabetes mellitus with hyperglycemia ([place]) 08/02/2017   Essential (primary) hypertension 08/02/2017   Other obesity due to excess calories 08/02/2017   Generalized anxiety disorder 08/02/2017    Past Surgical History:  Past Surgical History:  Procedure Laterality Date   CHOLECYSTECTOMY  2000   colon polyectomy     COLONOSCOPY WITH PROPOFOL N/A 03/31/2018   Procedure: COLONOSCOPY WITH PROPOFOL;  Surgeon: Jonathon Bellows, MD;  Location: [facility] ENDOSCOPY;  Service: Gastroenterology;  Laterality: N/A;   DENTAL SURGERY     FOOT SURGERY      Gynecologic History:  No LMP recorded. Patient is postmenopausal. Last Pap: Results were: 03/20/2018 NIL and HR HPV negative  Last mammogram: 08/15/20 Results were: Gillian Shields I  Obstetric History: G2P1011  Family History:  Family History  Problem Relation Age of Onset   Diabetes Mother    COPD Mother    Uterine cancer Mother 74   Cancer Sister 87       Bile Duct   Stroke Sister    Breast cancer Neg Hx     Social History:  Social History   Socioeconomic History   Marital status: Married    Spouse name: Not on file   Number of children: Not on file   Years of education: Not on file   Highest education level: Not on file  Occupational History   Not on file  Tobacco Use   Smoking status: Never   Smokeless tobacco: Never  Vaping Use   Vaping  Use: Never used  Substance and Sexual Activity   Alcohol use: Yes    Comment: occ.   Drug use: No   Sexual activity: Yes    Birth control/protection: Post-menopausal  Other Topics Concern   Not on file  Social History Narrative   Not on file   Social Determinants of Health   Financial Resource Strain: Not on file  Food Insecurity: Not on file  Transportation Needs: Not on file  Physical Activity: Not on file  Stress: Not on file  Social Connections: Not on file  Intimate Partner Violence: Not on  file    Allergies:  Allergies  Allergen Reactions   Azithromycin Hives   Contrast Media [Iodinated Contrast Media]     Medications: Prior to Admission medications   Medication Sig Start Date End Date Taking? Authorizing Provider  ALPRAZolam Duanne Moron) 0.5 MG tablet Take half to one tab as needed a day ( 3 months supply 05/09/21   [name], NP  benazepril (LOTENSIN) 20 MG tablet Take 1 tablet (20 mg total) by mouth daily. 11/14/20   McDonough, Si Gaul, PA-C  ertugliflozin L-PyroglutamicAc (STEGLATRO) 5 MG TABS tablet Take 1 tablet (5 mg total) by mouth daily before breakfast. E11.65 02/15/21   [name], NP  fluconazole (DIFLUCAN) 150 MG tablet Take 150 mg by mouth every 3 (three) days. 05/02/21   [provider]  nystatin cream (MYCOSTATIN) Apply 1 application topically 2 (two) times daily. 10/19/20   Malachy Mood, MD  omeprazole (PRILOSEC) 40 MG capsule Take 1 capsule (40 mg total) by mouth 2 (two) times daily. 03/18/20   Kendell Bane, NP  sertraline (ZOLOFT) 100 MG tablet Take 1 tablet (100 mg total) by mouth daily. 02/28/21   Lavera Guise, MD  traMADol Veatrice Bourbon) 50 MG tablet Take one tab po bid for pain prn only 05/09/21   [name], NP    Physical Exam Vitals: Blood pressure 138/86, pulse 83, height 5\' 3"  (1.6 m), weight 216 lb (98 kg).  General: NAD HEENT: normocephalic, anicteric Thyroid: no enlargement, no palpable nodules Pulmonary: No increased work of breathing, CTAB Cardiovascular: RRR, distal pulses 2+ Breast: Breast symmetrical, no tenderness, no palpable nodules or masses, no skin or nipple retraction present, no nipple discharge.  No axillary or supraclavicular lymphadenopathy. Abdomen: NABS, soft, non-tender, non-distended.  Umbilicus without lesions.  No hepatomegaly, splenomegaly or masses palpable. No evidence of hernia  Genitourinary:  External: Normal external female genitalia.  Normal urethral meatus, normal Bartholin's and  Skene's glands.    Vagina: Normal vaginal mucosa, no evidence of prolapse.    Cervix: Grossly normal in appearance, no bleeding  Uterus: Non-enlarged, mobile, normal contour.  No CMT  Adnexa: ovaries non-enlarged, no adnexal masses  Rectal: deferred  Lymphatic: no evidence of inguinal lymphadenopathy Extremities: no edema, erythema, or tenderness Neurologic: Grossly intact Psychiatric: mood appropriate, affect full  Female chaperone present for pelvic and breast  portions of the physical exam     Assessment: 61 y.o. G2P1011 routine annual exam  Plan: Problem List Items Addressed This Visit   None Visit Diagnoses     Encounter for gynecological examination without abnormal finding    -  Primary   Screening for malignant neoplasm of cervix       Relevant Orders   Cytology - PAP   Breast screening       Relevant Orders   MM 3D SCREEN BREAST BILATERAL   Colon cancer screening       Relevant Orders  Ambulatory referral to Gastroenterology   Vaginal candida       Relevant Medications   fluconazole (DIFLUCAN) 150 MG tablet   Vaginal atrophy           1) Mammogram - recommend yearly screening mammogram.  Mammogram Was ordered today  2) STI screening  was notoffered and therefore not obtained  3) ASCCP guidelines and rational discussed.  Patient opts for every 3 years screening interval  4) Osteoporosis  - per USPTF routine screening DEXA at age 39  5) Routine healthcare maintenance including cholesterol, diabetes screening discussed managed by PCP  6) Colonoscopy ordered  7) Vulvovaginal atrophy recurrent candida - Rx diflucan - Rx estrace cream  8) Return in about 1 year (around 06/27/2022) for annual.    Malachy Mood, MD Mosetta Pigeon, [facility] 06/27/2021, 8:48 AM

## 2021-06-28 ENCOUNTER — Telehealth: Payer: Self-pay

## 2021-06-28 NOTE — Telephone Encounter (Signed)
CALLED PATIENT NO ANSWER LEFT VOICEMAIL FOR A CALL BACK ? ?

## 2021-06-29 ENCOUNTER — Telehealth: Payer: Self-pay

## 2021-06-29 ENCOUNTER — Encounter: Payer: Self-pay | Admitting: Obstetrics and Gynecology

## 2021-06-29 LAB — CYTOLOGY - PAP
Comment: NEGATIVE
Diagnosis: NEGATIVE
High risk HPV: NEGATIVE

## 2021-06-29 NOTE — Telephone Encounter (Signed)
CALLED PATIENT NO ANSWER LEFT VOICEMAIL FOR A CALL BACK °Letter sent °

## 2021-06-30 ENCOUNTER — Encounter: Payer: Self-pay | Admitting: Obstetrics and Gynecology

## 2021-07-06 ENCOUNTER — Telehealth: Payer: Self-pay

## 2021-07-06 NOTE — Telephone Encounter (Signed)
Called right back just in case it takes her a minute to get to the phone still no answer left voicemail for call back

## 2021-07-06 NOTE — Telephone Encounter (Signed)
Called patient right back after she called and left voicemail no answer left voicemail for her

## 2021-07-25 ENCOUNTER — Other Ambulatory Visit: Payer: Self-pay | Admitting: Internal Medicine

## 2021-07-25 DIAGNOSIS — Z1231 Encounter for screening mammogram for malignant neoplasm of breast: Secondary | ICD-10-CM

## 2021-08-03 ENCOUNTER — Telehealth: Payer: Self-pay

## 2021-08-03 ENCOUNTER — Other Ambulatory Visit: Payer: Self-pay

## 2021-08-03 DIAGNOSIS — Z86010 Personal history of colonic polyps: Secondary | ICD-10-CM

## 2021-08-03 MED ORDER — PEG 3350-KCL-NA BICARB-NACL 420 G PO SOLR: 4000.0000 mL | Freq: Once | ORAL | 0 refills | Status: AC

## 2021-08-03 NOTE — Telephone Encounter (Signed)
Pt returning your call to schedule her screening colonoscopy. Please call patient at work. [phone]. She will be there until 5:00pm today. Thank you!

## 2021-08-03 NOTE — Progress Notes (Signed)
Gastroenterology Pre-Procedure Review  Request Date: 08/08/2021 Requesting Physician: Dr. Vicente Males   PATIENT REVIEW QUESTIONS: The patient responded to the following health history questions as indicated:    1. Are you having any GI issues? no 2. Do you have a personal history of Polyps? yes (last colonoscopy) 3. Do you have a family history of Colon Cancer or Polyps? yes (polyps) 4. Diabetes Mellitus? yes (type 1) 5. Joint replacements in the past 12 months?no 6. Major health problems in the past 3 months?no 7. Any artificial heart valves, MVP, or defibrillator?no    MEDICATIONS & ALLERGIES:    Patient reports the following regarding taking any anticoagulation/antiplatelet therapy:   Plavix, Coumadin, Eliquis, Xarelto, Lovenox, Pradaxa, Brilinta, or Effient? no Aspirin? no  Patient confirms/reports the following medications:  Current Outpatient Medications  Medication Sig Dispense Refill   ALPRAZolam (XANAX) 0.5 MG tablet Take half to one tab as needed a day ( 3 months supply 30 tablet 2   benazepril (LOTENSIN) 20 MG tablet Take 1 tablet (20 mg total) by mouth daily. 90 tablet 3   ertugliflozin L-PyroglutamicAc (STEGLATRO) 5 MG TABS tablet Take 1 tablet (5 mg total) by mouth daily before breakfast. E11.65 30 tablet 3   estradiol (ESTRACE VAGINAL) 0.1 MG/GM vaginal cream Place 1 Applicatorful vaginally 3 (three) times a week. 42.5 g 3   nystatin cream (MYCOSTATIN) Apply 1 application topically 2 (two) times daily. 30 g 1   omeprazole (PRILOSEC) 40 MG capsule Take 1 capsule (40 mg total) by mouth 2 (two) times daily. 60 capsule 3   sertraline (ZOLOFT) 100 MG tablet Take 1 tablet (100 mg total) by mouth daily. 90 tablet 1   traMADol (ULTRAM) 50 MG tablet Take one tab po bid for pain prn only 60 tablet 2   No current facility-administered medications for this visit.    Patient confirms/reports the following allergies:  Allergies  Allergen Reactions   Azithromycin Hives   Contrast  Media [Iodinated Contrast Media]     No orders of the defined types were placed in this encounter.   AUTHORIZATION INFORMATION Primary Insurance: 1D#: Group #:  Secondary Insurance: 1D#: Group #:  SCHEDULE INFORMATION: Date: 08/08/2021 Time: Location:[facility]

## 2021-08-07 ENCOUNTER — Encounter: Payer: Self-pay | Admitting: Gastroenterology

## 2021-08-07 ENCOUNTER — Other Ambulatory Visit: Payer: Self-pay

## 2021-08-07 ENCOUNTER — Encounter: Payer: Self-pay | Admitting: Nurse Practitioner

## 2021-08-07 ENCOUNTER — Ambulatory Visit: Payer: BC Managed Care – PPO | Admitting: Nurse Practitioner

## 2021-08-07 ENCOUNTER — Telehealth: Payer: Self-pay

## 2021-08-07 DIAGNOSIS — F411 Generalized anxiety disorder: Secondary | ICD-10-CM | POA: Diagnosis not present

## 2021-08-07 DIAGNOSIS — E1165 Type 2 diabetes mellitus with hyperglycemia: Secondary | ICD-10-CM | POA: Diagnosis not present

## 2021-08-07 DIAGNOSIS — M1991 Primary osteoarthritis, unspecified site: Secondary | ICD-10-CM

## 2021-08-07 LAB — POCT GLYCOSYLATED HEMOGLOBIN (HGB A1C): Hemoglobin A1C: 6.6 % — AB (ref 4.0–5.6)

## 2021-08-07 MED ORDER — PEG 3350-KCL-NA BICARB-NACL 420 G PO SOLR: 4000.0000 mL | Freq: Once | ORAL | 0 refills | Status: AC

## 2021-08-07 MED ORDER — ALPRAZOLAM 0.5 MG PO TABS: ORAL_TABLET | ORAL | 2 refills | Status: DC

## 2021-08-07 MED ORDER — STEGLATRO 5 MG PO TABS: 5.0000 mg | ORAL_TABLET | Freq: Every day | ORAL | 3 refills | Status: DC

## 2021-08-07 MED ORDER — SERTRALINE HCL 100 MG PO TABS: 100.0000 mg | ORAL_TABLET | Freq: Every day | ORAL | 1 refills | Status: DC

## 2021-08-07 MED ORDER — TRAMADOL HCL 50 MG PO TABS: ORAL_TABLET | ORAL | 2 refills | Status: DC

## 2021-08-07 NOTE — Telephone Encounter (Signed)
Patient called she ate red jello for lunch so we rescheduled patient till 09/11/2021

## 2021-08-07 NOTE — Progress Notes (Signed)
[facility] [place], [place] [address]  Internal MEDICINE  Office Visit Note  Patient Name: Kristen Sparks  [id number]  [id number]  Date of Service: 08/07/2021  Chief Complaint  Patient presents with   Follow-up   Depression   Diabetes   Hyperlipidemia   Hypertension   Asthma   Anxiety   Medication Refill    HPI Xin presents for a follow up visit for diabetes, medication refills and A1C. Her A1C was 6.6 today which is improved from 6.8 in august last year. She is currently taking steglatro for diabetes. She is scheduled for routine colonoscopy tomorrow. She is also having her mammogram done in march.  She takes alprazolam as needed for anxiety and does not take it every day per patient report. She is due for refill of tramadol as well. Blood pressure was initially elevated but improved when rechecked.    Current Medication: Outpatient Encounter Medications as of 08/07/2021  Medication Sig   benazepril (LOTENSIN) 20 MG tablet Take 1 tablet (20 mg total) by mouth daily.   estradiol (ESTRACE VAGINAL) 0.1 MG/GM vaginal cream Place 1 Applicatorful vaginally 3 (three) times a week.   nystatin cream (MYCOSTATIN) Apply 1 application topically 2 (two) times daily.   omeprazole (PRILOSEC) 40 MG capsule Take 1 capsule (40 mg total) by mouth 2 (two) times daily.   [DISCONTINUED] ALPRAZolam (XANAX) 0.5 MG tablet Take half to one tab as needed a day ( 3 months supply   [DISCONTINUED] ertugliflozin L-PyroglutamicAc (STEGLATRO) 5 MG TABS tablet Take 1 tablet (5 mg total) by mouth daily before breakfast. E11.65   [DISCONTINUED] sertraline (ZOLOFT) 100 MG tablet Take 1 tablet (100 mg total) by mouth daily.   [DISCONTINUED] traMADol (ULTRAM) 50 MG tablet Take one tab po bid for pain prn only   ALPRAZolam (XANAX) 0.5 MG tablet Take half to one tab as needed a day ( 3 months supply   ertugliflozin L-PyroglutamicAc (STEGLATRO) 5 MG TABS tablet Take 1 tablet (5 mg total) by  mouth daily before breakfast. E11.65   sertraline (ZOLOFT) 100 MG tablet Take 1 tablet (100 mg total) by mouth daily.   traMADol (ULTRAM) 50 MG tablet Take one tab po bid for pain prn only   No facility-administered encounter medications on file as of 08/07/2021.    Surgical History: Past Surgical History:  Procedure Laterality Date   CHOLECYSTECTOMY  2000   colon polyectomy     COLONOSCOPY WITH PROPOFOL N/A 03/31/2018   Procedure: COLONOSCOPY WITH PROPOFOL;  Surgeon: Jonathon Bellows, MD;  Location: [facility] ENDOSCOPY;  Service: Gastroenterology;  Laterality: N/A;   DENTAL SURGERY     FOOT SURGERY      Medical History: Past Medical History:  Diagnosis Date   Anxiety    Asthma    Depression    Environmental allergies    Hyperlipidemia    Hypertension    Rheumatoid arthritis ([place])    Sleep apnea 2019   Borderline   Type 2 diabetes mellitus ([place])     Family History: Family History  Problem Relation Age of Onset   Diabetes Mother    COPD Mother    Uterine cancer Mother 48   Cancer Sister 49       Bile Duct   Stroke Sister    Breast cancer Neg Hx     Social History   Socioeconomic History   Marital status: Married    Spouse name: Not on file   Number of children: Not on  file   Years of education: Not on file   Highest education level: Not on file  Occupational History   Not on file  Tobacco Use   Smoking status: Never   Smokeless tobacco: Never  Vaping Use   Vaping Use: Never used  Substance and Sexual Activity   Alcohol use: Yes    Comment: occ.   Drug use: No   Sexual activity: Yes    Birth control/protection: Post-menopausal  Other Topics Concern   Not on file  Social History Narrative   Not on file   Social Determinants of Health   Financial Resource Strain: Not on file  Food Insecurity: Not on file  Transportation Needs: Not on file  Physical Activity: Not on file  Stress: Not on file  Social Connections: Not on file  Intimate Partner Violence: Not  on file      Review of Systems  Constitutional:  Negative for chills, fatigue and unexpected weight change.  HENT:  Negative for congestion, rhinorrhea, sneezing and sore throat.   Eyes:  Negative for redness.  Respiratory:  Negative for cough, chest tightness and shortness of breath.   Cardiovascular: Negative.  Negative for chest pain and palpitations.  Gastrointestinal: Negative.  Negative for abdominal pain, constipation, diarrhea, nausea and vomiting.  Genitourinary:  Negative for dysuria and frequency.  Musculoskeletal: Negative.  Negative for arthralgias, back pain, joint swelling and neck pain.  Skin: Negative.  Negative for rash.  Neurological: Negative.  Negative for tremors and numbness.  Hematological:  Negative for adenopathy. Does not bruise/bleed easily.  Psychiatric/Behavioral:  Negative for behavioral problems (Depression), self-injury, sleep disturbance and suicidal ideas. The patient is nervous/anxious.    Vital Signs: BP 140/82 Comment: 162/90   Pulse 70    Temp 98.4 F (36.9 C)    Resp 16    Ht 5\' 3"  (1.6 m)    Wt 217 lb 9.6 oz (98.7 kg)    SpO2 97%    BMI 38.55 kg/m    Physical Exam Vitals reviewed.  Constitutional:      General: She is not in acute distress.    Appearance: Normal appearance. She is obese. She is not ill-appearing.  HENT:     Head: Normocephalic and atraumatic.  Eyes:     Pupils: Pupils are equal, round, and reactive to light.  Cardiovascular:     Rate and Rhythm: Normal rate and regular rhythm.  Pulmonary:     Effort: Pulmonary effort is normal. No respiratory distress.  Neurological:     Mental Status: She is alert and oriented to person, place, and time.     Cranial Nerves: No cranial nerve deficit.     Coordination: Coordination normal.     Gait: Gait normal.  Psychiatric:        Mood and Affect: Mood normal.        Behavior: Behavior normal.       Assessment/Plan: 1. Type 2 diabetes mellitus with hyperglycemia, without  long-term current use of insulin (HCC) A1C is improving, continue steglatro as prescribed, refills sent. Repeat A1C in 3 months.  - ertugliflozin L-PyroglutamicAc (STEGLATRO) 5 MG TABS tablet; Take 1 tablet (5 mg total) by mouth daily before breakfast. E11.65  Dispense: 90 tablet; Refill: 3 - POCT HgB A1C  2. Primary localized osteoarthrosis Stable, continue tramadol as prescribed.  - traMADol (ULTRAM) 50 MG tablet; Take one tab po bid for pain prn only  Dispense: 60 tablet; Refill: 2  3. Generalized anxiety disorder Stable, medications  refilled.  - ALPRAZolam (XANAX) 0.5 MG tablet; Take half to one tab as needed a day ( 3 months supply  Dispense: 30 tablet; Refill: 2 - sertraline (ZOLOFT) 100 MG tablet; Take 1 tablet (100 mg total) by mouth daily.  Dispense: 90 tablet; Refill: 1     General Counseling: Mayreli verbalizes understanding of the findings of todays visit and agrees with plan of treatment. I have discussed any further diagnostic evaluation that may be needed or ordered today. We also reviewed her medications today. she has been encouraged to call the office with any questions or concerns that should arise related to todays visit.    Orders Placed This Encounter  Procedures   POCT HgB A1C    Meds ordered this encounter  Medications   ALPRAZolam (XANAX) 0.5 MG tablet    Sig: Take half to one tab as needed a day ( 3 months supply    Dispense:  30 tablet    Refill:  2    3 months supply   traMADol (ULTRAM) 50 MG tablet    Sig: Take one tab po bid for pain prn only    Dispense:  60 tablet    Refill:  2   sertraline (ZOLOFT) 100 MG tablet    Sig: Take 1 tablet (100 mg total) by mouth daily.    Dispense:  90 tablet    Refill:  1   ertugliflozin L-PyroglutamicAc (STEGLATRO) 5 MG TABS tablet    Sig: Take 1 tablet (5 mg total) by mouth daily before breakfast. E11.65    Dispense:  90 tablet    Refill:  3    PA approved from 07/17/20 to 07/16/2021    Return in about 3  months (around 11/04/2021) for previously scheduled, CPE, please change provider to me.   Total time spent:30 Minutes Time spent includes review of chart, medications, test results, and follow up plan with the patient.   [place] Controlled Substance Database was reviewed by me.  This patient was seen by [name], FNP-C in collaboration with Dr. Clayborn Bigness as a part of collaborative care agreement.   Elese Rane R. Valetta Fuller, MSN, FNP-C Internal medicine

## 2021-08-10 ENCOUNTER — Encounter: Payer: Self-pay | Admitting: Nurse Practitioner

## 2021-08-13 ENCOUNTER — Telehealth: Payer: Self-pay

## 2021-08-13 ENCOUNTER — Other Ambulatory Visit: Payer: Self-pay

## 2021-08-13 DIAGNOSIS — E1165 Type 2 diabetes mellitus with hyperglycemia: Secondary | ICD-10-CM

## 2021-08-13 MED ORDER — STEGLATRO 5 MG PO TABS: 5.0000 mg | ORAL_TABLET | Freq: Every day | ORAL | 3 refills | Status: DC

## 2021-08-13 NOTE — Telephone Encounter (Signed)
PA for STEGLATRO 5 mg sent 08/13/21 at 515pm.  PA came back approved 08/13/21 to 08/12/22.  Sent new rx to pharmacy with approval

## 2021-08-15 ENCOUNTER — Telehealth: Payer: Self-pay

## 2021-08-15 NOTE — Telephone Encounter (Signed)
CALLED PATIENT NO ANSWER LEFT VOICEMAIL FOR A CALL BACK ? ?

## 2021-08-15 NOTE — Telephone Encounter (Signed)
Patient called to reschedule till 10/02/2021 Called endo sent new refferral and new communications

## 2021-08-15 NOTE — Telephone Encounter (Signed)
Patient left a voicemail and has some questions about schedule colonoscopy

## 2021-08-23 ENCOUNTER — Ambulatory Visit: Payer: BC Managed Care – PPO

## 2021-09-27 ENCOUNTER — Ambulatory Visit
Admission: RE | Admit: 2021-09-27 | Discharge: 2021-09-27 | Disposition: A | Discharge status: Home / Self Care | Payer: BC Managed Care – PPO | Source: Ambulatory Visit | Attending: Internal Medicine | Admitting: Internal Medicine

## 2021-09-27 DIAGNOSIS — Z1231 Encounter for screening mammogram for malignant neoplasm of breast: Secondary | ICD-10-CM | POA: Insufficient documentation

## 2021-09-29 ENCOUNTER — Encounter: Payer: Self-pay | Admitting: Gastroenterology

## 2021-10-02 ENCOUNTER — Ambulatory Visit: Payer: BC Managed Care – PPO | Admitting: Registered Nurse

## 2021-10-02 ENCOUNTER — Encounter
Admission: RE | Disposition: A | Discharge status: Home / Self Care | Payer: Self-pay | Source: Home / Self Care | Attending: Gastroenterology

## 2021-10-02 ENCOUNTER — Ambulatory Visit
Admission: RE | Admit: 2021-10-02 | Discharge: 2021-10-02 | Disposition: A | Discharge status: Home / Self Care | Payer: BC Managed Care – PPO | Attending: Gastroenterology | Admitting: Gastroenterology

## 2021-10-02 ENCOUNTER — Encounter: Payer: Self-pay | Admitting: Gastroenterology

## 2021-10-02 DIAGNOSIS — E119 Type 2 diabetes mellitus without complications: Secondary | ICD-10-CM | POA: Diagnosis not present

## 2021-10-02 DIAGNOSIS — F418 Other specified anxiety disorders: Secondary | ICD-10-CM | POA: Insufficient documentation

## 2021-10-02 DIAGNOSIS — Z1211 Encounter for screening for malignant neoplasm of colon: Secondary | ICD-10-CM | POA: Insufficient documentation

## 2021-10-02 DIAGNOSIS — Z6837 Body mass index (BMI) 37.0-37.9, adult: Secondary | ICD-10-CM | POA: Insufficient documentation

## 2021-10-02 DIAGNOSIS — K219 Gastro-esophageal reflux disease without esophagitis: Secondary | ICD-10-CM | POA: Diagnosis not present

## 2021-10-02 DIAGNOSIS — Z86010 Personal history of colonic polyps: Secondary | ICD-10-CM | POA: Diagnosis not present

## 2021-10-02 HISTORY — PX: COLONOSCOPY WITH PROPOFOL: SHX5780

## 2021-10-02 LAB — GLUCOSE, CAPILLARY: Glucose-Capillary: 125 mg/dL — ABNORMAL HIGH (ref 70–99)

## 2021-10-02 SURGERY — COLONOSCOPY WITH PROPOFOL
Anesthesia: General

## 2021-10-02 MED ORDER — SIMETHICONE 40 MG/0.6ML PO SUSP
ORAL | Status: DC | PRN
  Administered 2021-10-02: 60 mL

## 2021-10-02 MED ORDER — PROPOFOL 500 MG/50ML IV EMUL
INTRAVENOUS | Status: AC
  Filled 2021-10-02: qty 250

## 2021-10-02 MED ORDER — SODIUM CHLORIDE 0.9 % IV SOLN: INTRAVENOUS | Status: DC

## 2021-10-02 MED ORDER — LIDOCAINE HCL (CARDIAC) PF 100 MG/5ML IV SOSY
PREFILLED_SYRINGE | INTRAVENOUS | Status: DC | PRN
  Administered 2021-10-02: 100 mg via INTRAVENOUS

## 2021-10-02 MED ORDER — PROPOFOL 10 MG/ML IV BOLUS
INTRAVENOUS | Status: DC | PRN
  Administered 2021-10-02: 10 mg via INTRAVENOUS
  Administered 2021-10-02: 90 mg via INTRAVENOUS

## 2021-10-02 MED ORDER — PROPOFOL 500 MG/50ML IV EMUL
INTRAVENOUS | Status: DC | PRN
  Administered 2021-10-02: 150 ug/kg/min via INTRAVENOUS

## 2021-10-02 MED ORDER — DEXMEDETOMIDINE HCL IN NACL 80 MCG/20ML IV SOLN
INTRAVENOUS | Status: AC
  Filled 2021-10-02: qty 20

## 2021-10-02 MED ORDER — LIDOCAINE HCL (PF) 2 % IJ SOLN
INTRAMUSCULAR | Status: AC
  Filled 2021-10-02: qty 30

## 2021-10-02 MED ORDER — LIDOCAINE HCL (PF) 2 % IJ SOLN
INTRAMUSCULAR | Status: AC
  Filled 2021-10-02: qty 10

## 2021-10-02 NOTE — Op Note (Signed)
[facility] ?Gastroenterology ?Patient Name: Kristen Sparks ?Procedure Date: 10/02/2021 7:29 AM ?MRN: [id number] ?Account #: [id number] ?Date of Birth: 1960-12-13 ?Admit Type: Outpatient ?Age: 61 ?Room: [facility] ENDO ROOM 4 ?Gender: Female ?Note Status: Finalized ?Instrument Name: Colonoscope [id number] ?Procedure:             Colonoscopy ?Indications:           Surveillance: Personal history of adenomatous polyps  ?                       on last colonoscopy > 3 years ago ?Providers:             Jonathon Bellows MD, MD ?Medicines:             Monitored Anesthesia Care ?Complications:         No immediate complications. ?Procedure:             Pre-Anesthesia Assessment: ?                       - Prior to the procedure, a History and Physical was  ?                       performed, and patient medications, allergies and  ?                       sensitivities were reviewed. The patient's tolerance  ?                       of previous anesthesia was reviewed. ?                       - ASA Grade Assessment: II - A patient with mild  ?                       systemic disease. ?                       After obtaining informed consent, the colonoscope was  ?                       passed under direct vision. Throughout the procedure,  ?                       the patient's blood pressure, pulse, and oxygen  ?                       saturations were monitored continuously. The  ?                       Colonoscope was introduced through the anus and  ?                       advanced to the the cecum, identified by the  ?                       appendiceal orifice. The colonoscopy was performed  ?                       with ease. The patient tolerated the procedure well.  ?  The quality of the bowel preparation was good. ?Findings: ?     The perianal and digital rectal examinations were normal. ?     The entire examined colon appeared normal on direct and retroflexion  ?     views. ?Impression:            - The  entire examined colon is normal on direct and  ?                       retroflexion views. ?                       - No specimens collected. ?Recommendation:        - Discharge patient to home (with escort). ?                       - Resume previous diet. ?                       - Continue present medications. ?                       - Repeat colonoscopy in 5 years for surveillance. ?Procedure Code(s):     --- Professional --- ?                       [phone], Colonoscopy, flexible; diagnostic, including  ?                       collection of specimen(s) by brushing or washing, when  ?                       performed (separate procedure) ?Diagnosis Code(s):     --- Professional --- ?                       Z86.010, Personal history of colonic polyps ?CPT copyright 2019 American Medical Association. All rights reserved. ?The codes documented in this report are preliminary and upon coder review may  ?be revised to meet current compliance requirements. ?Jonathon Bellows, MD ?Jonathon Bellows MD, MD ?10/02/2021 8:11:40 AM ?This report has been signed electronically. ?Number of Addenda: 0 ?Note Initiated On: 10/02/2021 7:29 AM ?Scope Withdrawal Time: 0 hours 10 minutes 1 second  ?Total Procedure Duration: 0 hours 14 minutes 36 seconds  ?Estimated Blood Loss:  Estimated blood loss: none. ?     [facility] ?

## 2021-10-02 NOTE — Anesthesia Preprocedure Evaluation (Addendum)
Anesthesia Evaluation  ?Patient identified by MRN, date of birth, ID band ?Patient awake ? ? ? ?Reviewed: ?Allergy & Precautions, NPO status , Patient's Chart, lab work & pertinent test results ? ?History of Anesthesia Complications ?Negative for: history of anesthetic complications ? ?Airway ?Mallampati: I ? ? ?Neck ROM: Full ? ? ? Dental ? ?(+) Partial Lower, Partial Upper ?  ?Pulmonary ?asthma , sleep apnea ,  ?  ?Pulmonary exam normal ?breath sounds clear to auscultation ? ? ? ? ? ? Cardiovascular ?hypertension, Normal cardiovascular exam ?Rhythm:Regular Rate:Normal ? ? ?  ?Neuro/Psych ?PSYCHIATRIC DISORDERS Anxiety Depression   ? GI/Hepatic ?GERD  ,  ?Endo/Other  ?diabetes, Type 2Class 3 obesity ? Renal/GU ?negative Renal ROS  ? ?  ?Musculoskeletal ? ?(+) Arthritis , Osteoarthritis and Rheumatoid disorders,   ? Abdominal ?  ?Peds ? Hematology ?negative hematology ROS ?(+)   ?Anesthesia Other Findings ? ? Reproductive/Obstetrics ? ?  ? ? ? ? ? ? ? ? ? ? ? ? ? ?  ?  ? ? ? ? ? ? ? ?Anesthesia Physical ?Anesthesia Plan ? ?ASA: 3 ? ?Anesthesia Plan: General  ? ?Post-op Pain Management:   ? ?Induction: Intravenous ? ?PONV Risk Score and Plan: 3 and Propofol infusion, TIVA and Treatment may vary due to age or medical condition ? ?Airway Management Planned: Natural Airway ? ?Additional Equipment:  ? ?Intra-op Plan:  ? ?Post-operative Plan:  ? ?Informed Consent: I have reviewed the patients History and Physical, chart, labs and discussed the procedure including the risks, benefits and alternatives for the proposed anesthesia with the patient or authorized representative who has indicated his/her understanding and acceptance.  ? ? ? ? ? ?Plan Discussed with: CRNA ? ?Anesthesia Plan Comments: (LMA/GETA backup discussed.  Patient consented for risks of anesthesia including but not limited to:  ?- adverse reactions to medications ?- damage to eyes, teeth, lips or other oral mucosa ?- nerve  damage due to positioning  ?- sore throat or hoarseness ?- damage to heart, brain, nerves, lungs, other parts of body or loss of life ? ?Informed patient about role of CRNA in peri- and intra-operative care.  Patient voiced understanding.)  ? ? ? ? ? ? ?Anesthesia Quick Evaluation ? ?

## 2021-10-02 NOTE — Anesthesia Postprocedure Evaluation (Signed)
Anesthesia Post Note ? ?Patient: Kristen Sparks ? ?Procedure(s) Performed: COLONOSCOPY WITH PROPOFOL ? ?Patient location during evaluation: PACU ?Anesthesia Type: General ?Level of consciousness: awake and alert, oriented and patient cooperative ?Pain management: pain level controlled ?Vital Signs Assessment: post-procedure vital signs reviewed and stable ?Respiratory status: spontaneous breathing, nonlabored ventilation and respiratory function stable ?Cardiovascular status: blood pressure returned to baseline and stable ?Postop Assessment: adequate PO intake ?Anesthetic complications: no ? ? ?No notable events documented. ? ? ?Last Vitals:  ?Vitals:  ? 10/02/21 0658 10/02/21 0813  ?BP: (!) 154/80 104/71  ?Pulse: 66 66  ?Resp: 18 12  ?Temp: (!) 35.9 ?C (!) 36.2 ?C  ?SpO2: 99% 96%  ?  ?Last Pain:  ?Vitals:  ? 10/02/21 0833  ?TempSrc:   ?PainSc: 0-No pain  ? ? ?  ?  ?  ?  ?  ?  ? ?Darrin Nipper ? ? ? ? ?

## 2021-10-02 NOTE — Transfer of Care (Signed)
Immediate Anesthesia Transfer of Care Note ? ?Patient: Kristen Sparks ? ?Procedure(s) Performed: COLONOSCOPY WITH PROPOFOL ? ?Patient Location: Endoscopy Unit ? ?Anesthesia Type:General ? ?Level of Consciousness: drowsy ? ?Airway & Oxygen Therapy: Patient Spontanous Breathing ? ?Post-op Assessment: Report given to RN and Post -op Vital signs reviewed and stable ? ?Post vital signs: Reviewed and stable ? ?Last Vitals:  ?Vitals Value Taken Time  ?BP    ?Temp    ?Pulse 66 10/02/21 0813  ?Resp 11 10/02/21 0813  ?SpO2 95 % 10/02/21 0813  ?Vitals shown include unvalidated device data. ? ?Last Pain:  ?Vitals:  ? 10/02/21 0658  ?TempSrc: Temporal  ?   ? ?  ? ?Complications: No notable events documented. ?

## 2021-10-02 NOTE — H&P (Signed)
?Jonathon Bellows, MD ?[address] [place], [place], [place], [address] ?[address] [place], [place], [place], [address] ?Phone: [phone]  ?Fax: [phone] ? ?Primary Care Physician:  [name], NP ? ? ?Pre-Procedure History & Physical: ?HPI:  Kristen Sparks is a 61 y.o. female is here for an colonoscopy. ?  ?Past Medical History:  ?Diagnosis Date  ? Anxiety   ? Asthma   ? Depression   ? Environmental allergies   ? Hyperlipidemia   ? Hypertension   ? Rheumatoid arthritis ([place])   ? Sleep apnea 2019  ? Borderline  ? Type 2 diabetes mellitus ([place])   ? ? ?Past Surgical History:  ?Procedure Laterality Date  ? CHOLECYSTECTOMY  2000  ? colon polyectomy    ? COLONOSCOPY WITH PROPOFOL N/A 03/31/2018  ? Procedure: COLONOSCOPY WITH PROPOFOL;  Surgeon: Jonathon Bellows, MD;  Location: [facility] ENDOSCOPY;  Service: Gastroenterology;  Laterality: N/A;  ? DENTAL SURGERY    ? FOOT SURGERY    ? ? ?Prior to Admission medications   ?Medication Sig Start Date End Date Taking? Authorizing Provider  ?ALPRAZolam (XANAX) 0.5 MG tablet Take half to one tab as needed a day ( 3 months supply 08/07/21  Yes Abernathy, Alyssa, NP  ?benazepril (LOTENSIN) 20 MG tablet Take 1 tablet (20 mg total) by mouth daily. 11/14/20  Yes McDonough, Si Gaul, PA-C  ?ertugliflozin L-PyroglutamicAc (STEGLATRO) 5 MG TABS tablet Take 1 tablet (5 mg total) by mouth daily before breakfast. E11.65 08/13/21  Yes Abernathy, Alyssa, NP  ?omeprazole (PRILOSEC) 40 MG capsule Take 1 capsule (40 mg total) by mouth 2 (two) times daily. 03/18/20  Yes Kendell Bane, NP  ?traMADol Veatrice Bourbon) 50 MG tablet Take one tab po bid for pain prn only 08/07/21  Yes Abernathy, Alyssa, NP  ?estradiol (ESTRACE VAGINAL) 0.1 MG/GM vaginal cream Place 1 Applicatorful vaginally 3 (three) times a week. 06/28/21   Malachy Mood, MD  ?nystatin cream (MYCOSTATIN) Apply 1 application topically 2 (two) times daily. 10/19/20   Malachy Mood, MD  ?sertraline (ZOLOFT) 100 MG tablet Take 1 tablet  (100 mg total) by mouth daily. 08/07/21   [name], NP  ? ? ?Allergies as of 08/03/2021 - Review Complete 08/03/2021  ?Allergen Reaction Noted  ? Azithromycin Hives 08/08/2016  ? Contrast media [iodinated contrast media]  03/31/2018  ? ? ?Family History  ?Problem Relation Age of Onset  ? Diabetes Mother   ? COPD Mother   ? Uterine cancer Mother 79  ? Cancer Sister 63  ?     Bile Duct  ? Stroke Sister   ? Breast cancer Neg Hx   ? ? ?Social History  ? ?Socioeconomic History  ? Marital status: Married  ?  Spouse name: Not on file  ? Number of children: Not on file  ? Years of education: Not on file  ? Highest education level: Not on file  ?Occupational History  ? Not on file  ?Tobacco Use  ? Smoking status: Never  ? Smokeless tobacco: Never  ?Vaping Use  ? Vaping Use: Never used  ?Substance and Sexual Activity  ? Alcohol use: Yes  ?  Comment: occ.  ? Drug use: No  ? Sexual activity: Yes  ?  Birth control/protection: Post-menopausal  ?Other Topics Concern  ? Not on file  ?Social History Narrative  ? Not on file  ? ?Social Determinants of Health  ? ?Financial Resource Strain: Not on file  ?Food Insecurity: Not on file  ?Transportation Needs: Not  on file  ?Physical Activity: Not on file  ?Stress: Not on file  ?Social Connections: Not on file  ?Intimate Partner Violence: Not on file  ? ? ?Review of Systems: ?See HPI, otherwise negative ROS ? ?Physical Exam: ?BP (!) 154/80   Pulse 66   Temp (!) 96.6 ?F (35.9 ?C) (Temporal)   Resp 18   Ht '5\' 3"'$  (1.6 m)   Wt 96.6 kg   SpO2 99%   BMI 37.73 kg/m?  ?General:   Alert,  pleasant and cooperative in NAD ?Head:  Normocephalic and atraumatic. ?Neck:  Supple; no masses or thyromegaly. ?Lungs:  Clear throughout to auscultation, normal respiratory effort.    ?Heart:  +S1, +S2, Regular rate and rhythm, No edema. ?Abdomen:  Soft, nontender and nondistended. Normal bowel sounds, without guarding, and without rebound.   ?Neurologic:  Alert and  oriented x4;  grossly normal  neurologically. ? ?Impression/Plan: ?Kristen Sparks is here for an colonoscopy to be performed for surveillance due to prior history of colon polyps  ? ?Risks, benefits, limitations, and alternatives regarding  colonoscopy have been reviewed with the patient.  Questions have been answered.  All parties agreeable. ? ? ?Jonathon Bellows, MD  10/02/2021, 7:48 AM ? ?

## 2021-10-03 ENCOUNTER — Encounter: Payer: Self-pay | Admitting: Gastroenterology

## 2021-11-16 ENCOUNTER — Ambulatory Visit (INDEPENDENT_AMBULATORY_CARE_PROVIDER_SITE_OTHER): Payer: BC Managed Care – PPO | Admitting: Nurse Practitioner

## 2021-11-16 ENCOUNTER — Encounter: Payer: Self-pay | Admitting: Nurse Practitioner

## 2021-11-16 VITALS — BP 130/70 | HR 77 | Temp 98.3°F | Resp 16 | Ht 63.0 in | Wt 225.0 lb

## 2021-11-16 DIAGNOSIS — N951 Menopausal and female climacteric states: Secondary | ICD-10-CM | POA: Diagnosis not present

## 2021-11-16 DIAGNOSIS — K2100 Gastro-esophageal reflux disease with esophagitis, without bleeding: Secondary | ICD-10-CM

## 2021-11-16 DIAGNOSIS — R300 Dysuria: Secondary | ICD-10-CM | POA: Diagnosis not present

## 2021-11-16 DIAGNOSIS — M1991 Primary osteoarthritis, unspecified site: Secondary | ICD-10-CM

## 2021-11-16 DIAGNOSIS — Z0001 Encounter for general adult medical examination with abnormal findings: Secondary | ICD-10-CM | POA: Diagnosis not present

## 2021-11-16 DIAGNOSIS — E538 Deficiency of other specified B group vitamins: Secondary | ICD-10-CM

## 2021-11-16 DIAGNOSIS — E782 Mixed hyperlipidemia: Secondary | ICD-10-CM

## 2021-11-16 DIAGNOSIS — F339 Major depressive disorder, recurrent, unspecified: Secondary | ICD-10-CM

## 2021-11-16 DIAGNOSIS — E1165 Type 2 diabetes mellitus with hyperglycemia: Secondary | ICD-10-CM

## 2021-11-16 DIAGNOSIS — F411 Generalized anxiety disorder: Secondary | ICD-10-CM

## 2021-11-16 DIAGNOSIS — I10 Essential (primary) hypertension: Secondary | ICD-10-CM

## 2021-11-16 DIAGNOSIS — E876 Hypokalemia: Secondary | ICD-10-CM

## 2021-11-16 DIAGNOSIS — E559 Vitamin D deficiency, unspecified: Secondary | ICD-10-CM

## 2021-11-16 MED ORDER — BENAZEPRIL HCL 20 MG PO TABS: 20.0000 mg | ORAL_TABLET | Freq: Every day | ORAL | 3 refills | Status: DC

## 2021-11-16 MED ORDER — OMEPRAZOLE 40 MG PO CPDR: 40.0000 mg | DELAYED_RELEASE_CAPSULE | Freq: Two times a day (BID) | ORAL | 3 refills | Status: DC

## 2021-11-16 MED ORDER — VENLAFAXINE HCL ER 37.5 MG PO CP24: 37.5000 mg | ORAL_CAPSULE | Freq: Every day | ORAL | 1 refills | Status: DC

## 2021-11-16 MED ORDER — TRAMADOL HCL 50 MG PO TABS: ORAL_TABLET | ORAL | 2 refills | Status: DC

## 2021-11-16 NOTE — Progress Notes (Signed)
[facility] [place], [place] [address]  Internal MEDICINE  Office Visit Note  Patient Name: Kristen Sparks  [id number]  [id number]  Date of Service: 11/16/2021  Chief Complaint  Patient presents with   Annual Exam   Depression   Diabetes   Hyperlipidemia   Hypertension   Anxiety   Asthma    HPI Kristen Sparks presents for an annual well visit and physical exam.  She is a well-appearing 61 year old female with hypertension, asthma, GERD, diabetes and generalized anxiety disorder.  She had a routine screening mammogram done in April of this year with the results BI-RADS Category 1 negative.  She had a routine colonoscopy done in April of this year as well that was normal and she is to have a repeat colonoscopy in 5 years per Dr. Jonathon Bellows.  She sees OB/GYN for her Pap smear and it was done in January this year and she is due to repeat in 2026. She has gained 12 pounds since her last office visit and confirms that she has been drinking a lot of sweet tea and eating sweets when she is at work.  She is starting to work on diet modifications that will help her lose weight and trying to do more walking and being physically active as well She is interested in tapering off of the Zoloft and trying a different medication, she is also experiencing vasomotor symptoms of menopause including hot flashes and night sweats and brain fog.  Also patient has been feeling more emotionally labile lately.  She reports feeling tearful and depressed mood, not wanting to go out and do things and easily frustrated or irritated about smaller trivial things. Her blood pressure is stable and controlled with current medications.     Current Medication: Outpatient Encounter Medications as of 11/16/2021  Medication Sig   ALPRAZolam (XANAX) 0.5 MG tablet Take half to one tab as needed a day ( 3 months supply   ertugliflozin L-PyroglutamicAc (STEGLATRO) 5 MG TABS tablet Take 1 tablet (5 mg total) by  mouth daily before breakfast. E11.65   estradiol (ESTRACE VAGINAL) 0.1 MG/GM vaginal cream Place 1 Applicatorful vaginally 3 (three) times a week.   nystatin cream (MYCOSTATIN) Apply 1 application topically 2 (two) times daily.   sertraline (ZOLOFT) 100 MG tablet Take 1 tablet (100 mg total) by mouth daily.   venlafaxine XR (EFFEXOR-XR) 37.5 MG 24 hr capsule Take 1 capsule (37.5 mg total) by mouth daily with breakfast.   [DISCONTINUED] benazepril (LOTENSIN) 20 MG tablet Take 1 tablet (20 mg total) by mouth daily.   [DISCONTINUED] omeprazole (PRILOSEC) 40 MG capsule Take 1 capsule (40 mg total) by mouth 2 (two) times daily.   [DISCONTINUED] traMADol (ULTRAM) 50 MG tablet Take one tab po bid for pain prn only   benazepril (LOTENSIN) 20 MG tablet Take 1 tablet (20 mg total) by mouth daily.   omeprazole (PRILOSEC) 40 MG capsule Take 1 capsule (40 mg total) by mouth 2 (two) times daily.   traMADol (ULTRAM) 50 MG tablet Take one tab po bid for pain prn only   [DISCONTINUED] ertugliflozin L-PyroglutamicAc (STEGLATRO) 5 MG TABS tablet Take 1 tablet (5 mg total) by mouth daily before breakfast. E11.65   No facility-administered encounter medications on file as of 11/16/2021.    Surgical History: Past Surgical History:  Procedure Laterality Date   CHOLECYSTECTOMY  2000   colon polyectomy     COLONOSCOPY WITH PROPOFOL N/A 03/31/2018   Procedure: COLONOSCOPY WITH PROPOFOL;  Surgeon: Jonathon Bellows, MD;  Location: [facility] ENDOSCOPY;  Service: Gastroenterology;  Laterality: N/A;   COLONOSCOPY WITH PROPOFOL N/A 10/02/2021   Procedure: COLONOSCOPY WITH PROPOFOL;  Surgeon: Jonathon Bellows, MD;  Location: [facility] ENDOSCOPY;  Service: Gastroenterology;  Laterality: N/A;   DENTAL SURGERY     FOOT SURGERY      Medical History: Past Medical History:  Diagnosis Date   Anxiety    Asthma    Depression    Environmental allergies    Hyperlipidemia    Hypertension    Rheumatoid arthritis ([place])    Sleep apnea 2019    Borderline   Type 2 diabetes mellitus ([place])     Family History: Family History  Problem Relation Age of Onset   Diabetes Mother    COPD Mother    Uterine cancer Mother 43   Cancer Sister 35       Bile Duct   Stroke Sister    Breast cancer Neg Hx     Social History   Socioeconomic History   Marital status: Married    Spouse name: Not on file   Number of children: Not on file   Years of education: Not on file   Highest education level: Not on file  Occupational History   Not on file  Tobacco Use   Smoking status: Never   Smokeless tobacco: Never  Vaping Use   Vaping Use: Never used  Substance and Sexual Activity   Alcohol use: Yes    Comment: occ.   Drug use: No   Sexual activity: Yes    Birth control/protection: Post-menopausal  Other Topics Concern   Not on file  Social History Narrative   Not on file   Social Determinants of Health   Financial Resource Strain: Not on file  Food Insecurity: Not on file  Transportation Needs: Not on file  Physical Activity: Not on file  Stress: Not on file  Social Connections: Not on file  Intimate Partner Violence: Not on file      Review of Systems  Constitutional:  Negative for activity change, appetite change, chills, fatigue, fever and unexpected weight change.  HENT: Negative.  Negative for congestion, ear pain, rhinorrhea, sore throat and trouble swallowing.   Eyes: Negative.   Respiratory: Negative.  Negative for cough, chest tightness, shortness of breath and wheezing.   Cardiovascular: Negative.  Negative for chest pain.  Gastrointestinal: Negative.  Negative for abdominal pain, blood in stool, constipation, diarrhea, nausea and vomiting.  Endocrine: Negative.   Genitourinary: Negative.  Negative for difficulty urinating, dysuria, frequency, hematuria and urgency.  Musculoskeletal: Negative.  Negative for arthralgias, back pain, joint swelling, myalgias and neck pain.  Skin: Negative.  Negative for rash and  wound.  Allergic/Immunologic: Negative.  Negative for immunocompromised state.  Neurological: Negative.  Negative for dizziness, seizures, numbness and headaches.  Hematological: Negative.   Psychiatric/Behavioral:  Negative for behavioral problems, self-injury and suicidal ideas. The patient is not nervous/anxious.     Vital Signs: BP 130/70   Pulse 77   Temp 98.3 F (36.8 C)   Resp 16   Ht _0  (1.6 m)   Wt 225 lb (102.1 kg)   SpO2 98%   BMI 39.86 kg/m    Physical Exam Vitals reviewed.  Constitutional:      General: She is awake. She is not in acute distress.    Appearance: Normal appearance. She is well-developed. She is obese. She is not ill-appearing or diaphoretic.  HENT:  Head: Normocephalic and atraumatic.     Right Ear: Tympanic membrane, ear canal and external ear normal.     Left Ear: Tympanic membrane, ear canal and external ear normal.     Nose: Nose normal. No congestion or rhinorrhea.     Mouth/Throat:     Lips: Pink.     Mouth: Mucous membranes are moist.     Pharynx: Oropharynx is clear. Uvula midline. No oropharyngeal exudate or posterior oropharyngeal erythema.  Eyes:     General: Lids are normal. Vision grossly intact. Gaze aligned appropriately. No scleral icterus.       Right eye: No discharge.        Left eye: No discharge.     Extraocular Movements: Extraocular movements intact.     Conjunctiva/sclera: Conjunctivae normal.     Pupils: Pupils are equal, round, and reactive to light.     Funduscopic exam:    Right eye: Red reflex present.        Left eye: Red reflex present. Neck:     Thyroid: No thyromegaly.     Vascular: No carotid bruit or JVD.     Trachea: No tracheal deviation.  Cardiovascular:     Rate and Rhythm: Normal rate and regular rhythm.     Pulses: Normal pulses.     Heart sounds: Normal heart sounds, S1 normal and S2 normal. No murmur heard.    No friction rub. No gallop.  Pulmonary:     Effort: Pulmonary effort is  normal. No accessory muscle usage or respiratory distress.     Breath sounds: Normal breath sounds and air entry. No stridor. No wheezing or rales.  Chest:     Chest wall: No tenderness.     Comments: Declined clinical breast exam, patient sees OB/GYN and also had a mammogram recently. Abdominal:     General: Bowel sounds are normal. There is no distension.     Palpations: Abdomen is soft. There is no shifting dullness, fluid wave, mass or pulsatile mass.     Tenderness: There is no abdominal tenderness. There is no guarding or rebound.  Musculoskeletal:        General: No tenderness or deformity. Normal range of motion.     Cervical back: Normal range of motion and neck supple.     Right lower leg: 1+ Pitting Edema present.     Left lower leg: 1+ Pitting Edema present.  Lymphadenopathy:     Cervical: No cervical adenopathy.  Skin:    General: Skin is warm and dry.     Capillary Refill: Capillary refill takes less than 2 seconds.     Coloration: Skin is not pale.     Findings: No erythema or rash.  Neurological:     Mental Status: She is alert and oriented to person, place, and time.     Cranial Nerves: No cranial nerve deficit.     Motor: No abnormal muscle tone.     Coordination: Coordination normal.     Gait: Gait normal.     Deep Tendon Reflexes: Reflexes are normal and symmetric.  Psychiatric:        Mood and Affect: Mood normal.        Behavior: Behavior normal. Behavior is cooperative.        Thought Content: Thought content normal.        Judgment: Judgment normal.        Assessment/Plan: 1. Encounter for general adult medical examination with abnormal findings Age-appropriate preventive screenings and  vaccinations discussed, annual physical exam completed. Routine labs for health maintenance ordered, see below. PHM updated.  2. Essential (primary) hypertension Blood pressure stable and well-controlled with current medications, refills ordered. - CBC with  Differential/Platelet - TSH + free T4 - benazepril (LOTENSIN) 20 MG tablet; Take 1 tablet (20 mg total) by mouth daily.  Dispense: 90 tablet; Refill: 3  3. Type 2 diabetes mellitus with hyperglycemia, without long-term current use of insulin (HCC) A1c is due to be checked next month, routine labs ordered.  A1c is added to labs that she will have drawn at [facility] - CBC with Differential/Platelet - TSH + free T4 - Hgb A1C w/o eAG - CMP14+EGFR  4. Vasomotor symptoms due to menopause Venlafaxine prescribed to help control vasomotor symptoms of menopause. - venlafaxine XR (EFFEXOR-XR) 37.5 MG 24 hr capsule; Take 1 capsule (37.5 mg total) by mouth daily with breakfast.  Dispense: 30 capsule; Refill: 1  5. Primary localized osteoarthrosis Tramadol refill ordered - traMADol (ULTRAM) 50 MG tablet; Take one tab po bid for pain prn only  Dispense: 60 tablet; Refill: 2  6. Gastroesophageal reflux disease with esophagitis without hemorrhage Omeprazole refills ordered, routine labs ordered. - CBC with Differential/Platelet - TSH + free T4 - omeprazole (PRILOSEC) 40 MG capsule; Take 1 capsule (40 mg total) by mouth 2 (two) times daily.  Dispense: 180 capsule; Refill: 3  7. Vitamin D deficiency Routine labs ordered - CBC with Differential/Platelet - Vitamin D (25 hydroxy) - TSH + free T4  8. Mixed hyperlipidemia Routine labs ordered - CBC with Differential/Platelet - TSH + free T4 - Lipid Profile  9. B12 deficiency Routine labs ordered - CBC with Differential/Platelet - TSH + free T4 - Iron, TIBC and Ferritin Panel - B12 and Folate Panel  10. Dysuria Routine urinalysis done - UA/M w/rflx Culture, Routine - Microscopic Examination - Urine Culture, Reflex  11. Generalized anxiety disorder Zoloft discontinued and patient is tapering off.  Patient will start venlafaxine extended release 37.5 mg daily to manage her anxiety, depression and vasomotor symptoms of menopause. - venlafaxine  XR (EFFEXOR-XR) 37.5 MG 24 hr capsule; Take 1 capsule (37.5 mg total) by mouth daily with breakfast.  Dispense: 30 capsule; Refill: 1  12. Depression, recurrent [facility]) Patient will taper off of Zoloft as discussed during her office visit today.  Venlafaxine prescribed to manage her depression, anxiety and vasomotor symptoms of menopause. - venlafaxine XR (EFFEXOR-XR) 37.5 MG 24 hr capsule; Take 1 capsule (37.5 mg total) by mouth daily with breakfast.  Dispense: 30 capsule; Refill: 1    General Counseling: Solange verbalizes understanding of the findings of todays visit and agrees with plan of treatment. I have discussed any further diagnostic evaluation that may be needed or ordered today. We also reviewed her medications today. she has been encouraged to call the office with any questions or concerns that should arise related to todays visit.    Orders Placed This Encounter  Procedures   Microscopic Examination   Urine Culture, Reflex   UA/M w/rflx Culture, Routine   CBC with Differential/Platelet   Vitamin D (25 hydroxy)   TSH + free T4   Lipid Profile   Hgb A1C w/o eAG   CMP14+EGFR   Iron, TIBC and Ferritin Panel   B12 and Folate Panel    Meds ordered this encounter  Medications   venlafaxine XR (EFFEXOR-XR) 37.5 MG 24 hr capsule    Sig: Take 1 capsule (37.5 mg total) by mouth daily with breakfast.  Dispense:  30 capsule    Refill:  1   traMADol (ULTRAM) 50 MG tablet    Sig: Take one tab po bid for pain prn only    Dispense:  60 tablet    Refill:  2   benazepril (LOTENSIN) 20 MG tablet    Sig: Take 1 tablet (20 mg total) by mouth daily.    Dispense:  90 tablet    Refill:  3   omeprazole (PRILOSEC) 40 MG capsule    Sig: Take 1 capsule (40 mg total) by mouth 2 (two) times daily.    Dispense:  180 capsule    Refill:  3    Return in about 1 month (around 12/17/2021) for F/U, eval new med, Ervan Heber PCP, Anxiety/depression.   Total time spent:30 Minutes Time spent includes  review of chart, medications, test results, and follow up plan with the patient.    Controlled Substance Database was reviewed by me.  This patient was seen by [name], FNP-C in collaboration with Dr. Clayborn Bigness as a part of collaborative care agreement.  Jamille Yoshino R. Valetta Fuller, MSN, FNP-C Internal medicine

## 2021-11-19 LAB — UA/M W/RFLX CULTURE, ROUTINE
Bilirubin, UA: NEGATIVE
Ketones, UA: NEGATIVE
Nitrite, UA: NEGATIVE
Protein,UA: NEGATIVE
RBC, UA: NEGATIVE
Specific Gravity, UA: 1.024 (ref 1.005–1.030)
Urobilinogen, Ur: 0.2 mg/dL (ref 0.2–1.0)
pH, UA: 5.5 (ref 5.0–7.5)

## 2021-11-19 LAB — URINE CULTURE, REFLEX

## 2021-11-19 LAB — MICROSCOPIC EXAMINATION
Casts: NONE SEEN /lpf
Epithelial Cells (non renal): >10 /hpf — AB (ref 0–10)
RBC, Urine: NONE SEEN /hpf (ref 0–2)
WBC, UA: >30 /hpf — AB (ref 0–5)

## 2021-12-18 ENCOUNTER — Other Ambulatory Visit
Admission: RE | Admit: 2021-12-18 | Discharge: 2021-12-18 | Disposition: A | Discharge status: Home / Self Care | Payer: BC Managed Care – PPO | Attending: Nurse Practitioner | Admitting: Nurse Practitioner

## 2021-12-18 ENCOUNTER — Encounter: Payer: Self-pay | Admitting: Nurse Practitioner

## 2021-12-18 ENCOUNTER — Ambulatory Visit: Payer: BC Managed Care – PPO | Admitting: Nurse Practitioner

## 2021-12-18 VITALS — BP 131/75 | HR 77 | Temp 98.8°F | Resp 16 | Ht 63.0 in | Wt 222.8 lb

## 2021-12-18 DIAGNOSIS — N941 Unspecified dyspareunia: Secondary | ICD-10-CM | POA: Diagnosis not present

## 2021-12-18 DIAGNOSIS — E1165 Type 2 diabetes mellitus with hyperglycemia: Secondary | ICD-10-CM

## 2021-12-18 DIAGNOSIS — I10 Essential (primary) hypertension: Secondary | ICD-10-CM | POA: Diagnosis not present

## 2021-12-18 DIAGNOSIS — N951 Menopausal and female climacteric states: Secondary | ICD-10-CM

## 2021-12-18 DIAGNOSIS — F411 Generalized anxiety disorder: Secondary | ICD-10-CM | POA: Insufficient documentation

## 2021-12-18 DIAGNOSIS — E876 Hypokalemia: Secondary | ICD-10-CM | POA: Insufficient documentation

## 2021-12-18 DIAGNOSIS — K2100 Gastro-esophageal reflux disease with esophagitis, without bleeding: Secondary | ICD-10-CM | POA: Diagnosis not present

## 2021-12-18 DIAGNOSIS — R300 Dysuria: Secondary | ICD-10-CM | POA: Diagnosis not present

## 2021-12-18 DIAGNOSIS — E559 Vitamin D deficiency, unspecified: Secondary | ICD-10-CM | POA: Insufficient documentation

## 2021-12-18 DIAGNOSIS — E782 Mixed hyperlipidemia: Secondary | ICD-10-CM | POA: Diagnosis not present

## 2021-12-18 DIAGNOSIS — E538 Deficiency of other specified B group vitamins: Secondary | ICD-10-CM | POA: Diagnosis not present

## 2021-12-18 DIAGNOSIS — M1991 Primary osteoarthritis, unspecified site: Secondary | ICD-10-CM | POA: Insufficient documentation

## 2021-12-18 LAB — VITAMIN B12: Vitamin B-12: 204 pg/mL (ref 180–914)

## 2021-12-18 LAB — CBC WITH DIFFERENTIAL/PLATELET
Abs Immature Granulocytes: 0.02 10*3/uL (ref 0.00–0.07)
Basophils Absolute: 0.1 10*3/uL (ref 0.0–0.1)
Basophils Relative: 1 %
Eosinophils Absolute: 0.2 10*3/uL (ref 0.0–0.5)
Eosinophils Relative: 4 %
HCT: 42.9 % (ref 36.0–46.0)
Hemoglobin: 13.9 g/dL (ref 12.0–15.0)
Immature Granulocytes: 0 %
Lymphocytes Relative: 42 %
Lymphs Abs: 2.8 10*3/uL (ref 0.7–4.0)
MCH: 29.9 pg (ref 26.0–34.0)
MCHC: 32.4 g/dL (ref 30.0–36.0)
MCV: 92.3 fL (ref 80.0–100.0)
Monocytes Absolute: 0.4 10*3/uL (ref 0.1–1.0)
Monocytes Relative: 6 %
Neutro Abs: 3.2 10*3/uL (ref 1.7–7.7)
Neutrophils Relative %: 47 %
Platelets: 226 10*3/uL (ref 150–400)
RBC: 4.65 MIL/uL (ref 3.87–5.11)
RDW: 14.2 % (ref 11.5–15.5)
WBC: 6.7 10*3/uL (ref 4.0–10.5)
nRBC: 0 % (ref 0.0–0.2)

## 2021-12-18 LAB — POCT GLYCOSYLATED HEMOGLOBIN (HGB A1C): Hemoglobin A1C: 6.7 % — AB (ref 4.0–5.6)

## 2021-12-18 LAB — COMPREHENSIVE METABOLIC PANEL
ALT: 53 U/L — ABNORMAL HIGH (ref 0–44)
AST: 33 U/L (ref 15–41)
Albumin: 4.4 g/dL (ref 3.5–5.0)
Alkaline Phosphatase: 147 U/L — ABNORMAL HIGH (ref 38–126)
Anion gap: 9 (ref 5–15)
BUN: 18 mg/dL (ref 6–20)
CO2: 25 mmol/L (ref 22–32)
Calcium: 9.4 mg/dL (ref 8.9–10.3)
Chloride: 105 mmol/L (ref 98–111)
Creatinine, Ser: 0.42 mg/dL — ABNORMAL LOW (ref 0.44–1.00)
GFR, Estimated: >60 mL/min (ref >60)
Glucose, Bld: 132 mg/dL — ABNORMAL HIGH (ref 70–99)
Potassium: 4 mmol/L (ref 3.5–5.1)
Sodium: 139 mmol/L (ref 135–145)
Total Bilirubin: 0.5 mg/dL (ref 0.3–1.2)
Total Protein: 7.6 g/dL (ref 6.5–8.1)

## 2021-12-18 LAB — TSH: TSH: 0.942 u[IU]/mL (ref 0.350–4.500)

## 2021-12-18 LAB — LIPID PANEL
Cholesterol: 204 mg/dL — ABNORMAL HIGH (ref 0–200)
HDL: 62 mg/dL (ref >40)
LDL Cholesterol: 114 mg/dL — ABNORMAL HIGH (ref 0–99)
Total CHOL/HDL Ratio: 3.3 RATIO
Triglycerides: 138 mg/dL (ref <150)
VLDL: 28 mg/dL (ref 0–40)

## 2021-12-18 LAB — IRON AND TIBC
Iron: 142 ug/dL (ref 28–170)
Saturation Ratios: 39 % — ABNORMAL HIGH (ref 10.4–31.8)
TIBC: 365 ug/dL (ref 250–450)
UIBC: 223 ug/dL

## 2021-12-18 LAB — FERRITIN: Ferritin: 102 ng/mL (ref 11–307)

## 2021-12-18 LAB — T4, FREE: Free T4: 0.99 ng/dL (ref 0.61–1.12)

## 2021-12-18 LAB — FOLATE: Folate: 9.4 ng/mL (ref >5.9)

## 2021-12-18 LAB — VITAMIN D 25 HYDROXY (VIT D DEFICIENCY, FRACTURES): Vit D, 25-Hydroxy: 36.23 ng/mL (ref 30–100)

## 2021-12-18 MED ORDER — INTRAROSA 6.5 MG VA INST: 6.5000 mg | VAGINAL_INSERT | Freq: Every day | VAGINAL | 12 refills | Status: DC

## 2021-12-20 LAB — HEMOGLOBIN A1C
Hgb A1c MFr Bld: 6.9 % — ABNORMAL HIGH (ref 4.8–5.6)
Mean Plasma Glucose: 151 mg/dL

## 2021-12-22 NOTE — Progress Notes (Signed)
I have reviewed the lab results. There are no critically abnormal values requiring immediate intervention but there are some abnormals that will be discussed at the next office visit on July 25th

## 2021-12-26 ENCOUNTER — Other Ambulatory Visit: Payer: Self-pay | Admitting: Physician Assistant

## 2021-12-26 DIAGNOSIS — I10 Essential (primary) hypertension: Secondary | ICD-10-CM

## 2022-01-04 ENCOUNTER — Encounter: Payer: Self-pay | Admitting: Nurse Practitioner

## 2022-01-16 ENCOUNTER — Ambulatory Visit: Payer: BC Managed Care – PPO | Admitting: Nurse Practitioner

## 2022-01-24 ENCOUNTER — Telehealth: Payer: Self-pay

## 2022-01-24 NOTE — Telephone Encounter (Signed)
Spoke to Kristen Sparks about her STEGLTRO 5 mg was at $0 copay and now it says she has a high copay.  I called pharmacy and spoke to [facility] and he advised that Kristen Sparks may have had a coupon before that was good for a year or her insurance could have changed its coverage.  I spoke back to Kristen Sparks and informed her of information.  I sent Kristen Sparks information on getting another coupon and seeing if it works and if not she will need to call her insurance to see if anything changes.

## 2022-02-01 ENCOUNTER — Telehealth: Payer: Self-pay

## 2022-02-01 ENCOUNTER — Other Ambulatory Visit: Payer: Self-pay

## 2022-02-01 ENCOUNTER — Encounter: Payer: Self-pay | Admitting: Physician Assistant

## 2022-02-01 MED ORDER — FLUCONAZOLE 150 MG PO TABS: 150.0000 mg | ORAL_TABLET | Freq: Once | ORAL | 0 refills | Status: DC

## 2022-02-01 NOTE — Telephone Encounter (Signed)
Pt called she is having yeast infection no other symptoms as per alyssa send diflucan and canceled her appt for tomorrow and advised her if she is not feeling better need appt

## 2022-02-01 NOTE — Telephone Encounter (Signed)
Lmom to call us back 

## 2022-02-02 ENCOUNTER — Ambulatory Visit: Payer: BC Managed Care – PPO | Admitting: Physician Assistant

## 2022-04-06 ENCOUNTER — Other Ambulatory Visit (HOSPITAL_COMMUNITY)
Admission: RE | Admit: 2022-04-06 | Discharge: 2022-04-06 | Disposition: A | Discharge status: Home / Self Care | Payer: BC Managed Care – PPO | Source: Ambulatory Visit | Attending: Certified Nurse Midwife | Admitting: Certified Nurse Midwife

## 2022-04-06 ENCOUNTER — Ambulatory Visit: Payer: BC Managed Care – PPO | Admitting: Certified Nurse Midwife

## 2022-04-06 ENCOUNTER — Encounter: Payer: Self-pay | Admitting: Certified Nurse Midwife

## 2022-04-06 VITALS — BP 133/79 | HR 79 | Wt 220.3 lb

## 2022-04-06 DIAGNOSIS — B3731 Acute candidiasis of vulva and vagina: Secondary | ICD-10-CM | POA: Diagnosis not present

## 2022-04-06 DIAGNOSIS — N898 Other specified noninflammatory disorders of vagina: Secondary | ICD-10-CM | POA: Diagnosis not present

## 2022-04-06 MED ORDER — ESTRADIOL 10 MCG VA TABS: 10.0000 ug | ORAL_TABLET | VAGINAL | 5 refills | Status: DC

## 2022-04-06 MED ORDER — ESTRADIOL 10 MCG VA TABS: 10.0000 ug | ORAL_TABLET | Freq: Every day | VAGINAL | 0 refills | Status: AC

## 2022-04-06 NOTE — Progress Notes (Signed)
GYN ENCOUNTER NOTE  Subjective:       Kristen Sparks is a 61 y.o. G27P1011 female is here for gynecologic evaluation of the following issues:  1. Vaginal itching and pain for several months.  Has been seen in the past by Dr. Star Age for this same reason. Was given vaginal estrogen but was not able to fill due to cost. Has treated with diflucan , flagyl , and monistat with minimal relief.    Gynecologic History No LMP recorded. Patient is postmenopausal. Contraception: post menopausal status Last Pap: 06/27/2021. Results were: normal Last mammogram: 09/27/2021. Results were: normal  Obstetric History OB History  Gravida Para Term Preterm AB Living  '2 1 1   1 1  '$ SAB IAB Ectopic Multiple Live Births  1       1    # Outcome Date GA Lbr Len/2nd Weight Sex Delivery Anes PTL Lv  2 Term 10/28/84   3.657 kg M Vag-Spont  N LIV  1 SAB             Past Medical History:  Diagnosis Date   Anxiety    Asthma    Depression    Environmental allergies    Hyperlipidemia    Hypertension    Rheumatoid arthritis ([place])    Sleep apnea 2019   Borderline   Type 2 diabetes mellitus ([place])     Past Surgical History:  Procedure Laterality Date   CHOLECYSTECTOMY  2000   colon polyectomy     COLONOSCOPY WITH PROPOFOL N/A 03/31/2018   Procedure: COLONOSCOPY WITH PROPOFOL;  Surgeon: Jonathon Bellows, MD;  Location: [facility] ENDOSCOPY;  Service: Gastroenterology;  Laterality: N/A;   COLONOSCOPY WITH PROPOFOL N/A 10/02/2021   Procedure: COLONOSCOPY WITH PROPOFOL;  Surgeon: Jonathon Bellows, MD;  Location: [facility] ENDOSCOPY;  Service: Gastroenterology;  Laterality: N/A;   DENTAL SURGERY     FOOT SURGERY      Current Outpatient Medications on File Prior to Visit  Medication Sig Dispense Refill   ALPRAZolam (XANAX) 0.5 MG tablet Take half to one tab as needed a day ( 3 months supply 30 tablet 2   benazepril (LOTENSIN) 20 MG tablet Take 1 tablet (20 mg total) by mouth daily. 90 tablet 3   ertugliflozin L-PyroglutamicAc  (STEGLATRO) 5 MG TABS tablet Take 1 tablet (5 mg total) by mouth daily before breakfast. E11.65 90 tablet 3   estradiol (ESTRACE VAGINAL) 0.1 MG/GM vaginal cream Place 1 Applicatorful vaginally 3 (three) times a week. 42.5 g 3   INTRAROSA 6.5 MG INST Place 6.5 mg vaginally at bedtime. 28 each 12   nystatin cream (MYCOSTATIN) Apply 1 application topically 2 (two) times daily. 30 g 1   omeprazole (PRILOSEC) 40 MG capsule Take 1 capsule (40 mg total) by mouth 2 (two) times daily. 180 capsule 3   sertraline (ZOLOFT) 100 MG tablet Take 1 tablet (100 mg total) by mouth daily. 90 tablet 1   traMADol (ULTRAM) 50 MG tablet Take one tab po bid for pain prn only 60 tablet 2   venlafaxine XR (EFFEXOR-XR) 37.5 MG 24 hr capsule Take 1 capsule (37.5 mg total) by mouth daily with breakfast. 30 capsule 1   No current facility-administered medications on file prior to visit.    Allergies  Allergen Reactions   Azithromycin Hives   Contrast Media [Iodinated Contrast Media]     Social History   Socioeconomic History   Marital status: Married    Spouse name: Not on file   Number of  children: Not on file   Years of education: Not on file   Highest education level: Not on file  Occupational History   Not on file  Tobacco Use   Smoking status: Never   Smokeless tobacco: Never  Vaping Use   Vaping Use: Never used  Substance and Sexual Activity   Alcohol use: Yes    Comment: occ.   Drug use: No   Sexual activity: Yes    Birth control/protection: Post-menopausal  Other Topics Concern   Not on file  Social History Narrative   Not on file   Social Determinants of Health   Financial Resource Strain: Not on file  Food Insecurity: Not on file  Transportation Needs: Not on file  Physical Activity: Not on file  Stress: Not on file  Social Connections: Not on file  Intimate Partner Violence: Not on file    Family History  Problem Relation Age of Onset   Diabetes Mother    COPD Mother     Uterine cancer Mother 4   Cancer Sister 95       Bile Duct   Stroke Sister    Breast cancer Neg Hx     The following portions of the patient's history were reviewed and updated as appropriate: allergies, current medications, past family history, past medical history, past social history, past surgical history and problem list.  Review of Systems Review of Systems - Negative except as mentioned in HPI Review of Systems - General ROS: negative for - chills, fatigue, fever, hot flashes, malaise or night sweats Hematological and Lymphatic ROS: negative for - bleeding problems or swollen lymph nodes Gastrointestinal ROS: negative for - abdominal pain, blood in stools, change in bowel habits and nausea/vomiting Musculoskeletal ROS: negative for - joint pain, muscle pain or muscular weakness Genito-Urinary ROS: negative for - change in menstrual cycle, dysmenorrhea, , dysuria, genital discharge, genital ulcers, hematuria, incontinence, irregular/heavy menses, nocturia or pelvic pain. Positive for vaginal and labial itching and pain , vaginal dryness and pain with intercourse.   Objective:   BP 133/79   Pulse 79   Wt 99.9 kg   BMI 39.02 kg/m  CONSTITUTIONAL: Well-developed, well-nourished female in no acute distress.  HENT:  Normocephalic, atraumatic.  NECK: Normal range of motion, supple, no masses.  Normal thyroid.  SKIN: Skin is warm and dry. No rash noted. Not diaphoretic. No erythema. No pallor. [place]: Alert and oriented to person, place, and time. PSYCHIATRIC: Normal mood and affect. Normal behavior. Normal judgment and thought content. CARDIOVASCULAR:Not Examined RESPIRATORY: Not Examined BREASTS: Not Examined ABDOMEN: Soft, non distended; Non tender.  No Organomegaly. PELVIC:  External Genitalia: Normal  BUS: Normal, irritation , tenderness at hood of clitoris, pt state lots of external itching. Blanching white color on touch.   Vagina: Normal, small amount of white discharge    Cervix: Normal  MUSCULOSKELETAL: Normal range of motion. No tenderness.  No cyanosis, clubbing, or edema.     Assessment:   Vaginal atrophy  Vaginal itching Labial itching and irritation  dyspareunia   Plan:   Swab collected  . New order placed for alternative vaginal estrogen. Discussed it labs normal use of steroid cream to help itching. Discussed possible [place] sclerosis and need for biopsy to determine for sure. She verbalize understanding. Will follow up with results .   Philip Aspen, CN M

## 2022-04-09 ENCOUNTER — Encounter: Payer: Self-pay | Admitting: Certified Nurse Midwife

## 2022-04-09 ENCOUNTER — Other Ambulatory Visit: Payer: Self-pay | Admitting: Certified Nurse Midwife

## 2022-04-09 LAB — CERVICOVAGINAL ANCILLARY ONLY
Bacterial Vaginitis (gardnerella): NEGATIVE
Candida Glabrata: NEGATIVE
Candida Vaginitis: POSITIVE — AB
Comment: NEGATIVE
Comment: NEGATIVE
Comment: NEGATIVE

## 2022-04-09 MED ORDER — FLUCONAZOLE 150 MG PO TABS: 150.0000 mg | ORAL_TABLET | ORAL | 0 refills | Status: AC

## 2022-04-20 ENCOUNTER — Ambulatory Visit: Payer: BC Managed Care – PPO | Admitting: Obstetrics and Gynecology

## 2022-05-03 ENCOUNTER — Telehealth: Payer: Self-pay | Admitting: Nurse Practitioner

## 2022-05-03 DIAGNOSIS — M1991 Primary osteoarthritis, unspecified site: Secondary | ICD-10-CM

## 2022-05-03 MED ORDER — TRAMADOL HCL 50 MG PO TABS: ORAL_TABLET | ORAL | 0 refills | Status: DC

## 2022-05-03 NOTE — Addendum Note (Signed)
Addended by: [name] on: 05/03/2022 01:13 PM   Modules accepted: Orders

## 2022-05-03 NOTE — Telephone Encounter (Signed)
Spoke  with pt need appt for tramadol refills

## 2022-05-07 ENCOUNTER — Encounter: Payer: Self-pay | Admitting: Nurse Practitioner

## 2022-05-07 ENCOUNTER — Ambulatory Visit: Payer: BC Managed Care – PPO | Admitting: Nurse Practitioner

## 2022-05-07 VITALS — BP 141/65 | HR 79 | Temp 97.5°F | Resp 16 | Ht 63.0 in | Wt 225.2 lb

## 2022-05-07 DIAGNOSIS — I10 Essential (primary) hypertension: Secondary | ICD-10-CM | POA: Diagnosis not present

## 2022-05-07 DIAGNOSIS — F411 Generalized anxiety disorder: Secondary | ICD-10-CM

## 2022-05-07 DIAGNOSIS — E1165 Type 2 diabetes mellitus with hyperglycemia: Secondary | ICD-10-CM | POA: Diagnosis not present

## 2022-05-07 DIAGNOSIS — M1991 Primary osteoarthritis, unspecified site: Secondary | ICD-10-CM

## 2022-05-07 LAB — POCT GLYCOSYLATED HEMOGLOBIN (HGB A1C): Hemoglobin A1C: 7 % — AB (ref 4.0–5.6)

## 2022-05-07 MED ORDER — SERTRALINE HCL 100 MG PO TABS: 100.0000 mg | ORAL_TABLET | Freq: Every day | ORAL | 1 refills | Status: DC

## 2022-05-07 MED ORDER — TRAMADOL HCL 50 MG PO TABS: ORAL_TABLET | ORAL | 0 refills | Status: DC

## 2022-05-07 MED ORDER — ALPRAZOLAM 0.5 MG PO TABS: ORAL_TABLET | ORAL | 0 refills | Status: DC

## 2022-05-07 NOTE — Progress Notes (Signed)
[facility] [place], [place] [address]  Internal MEDICINE  Office Visit Note  Patient Name: Kristen Sparks  [id number]  [id number]  Date of Service: 05/07/2022  Chief Complaint  Patient presents with   Follow-up    Med refills   Depression   Diabetes   Hyperlipidemia   Hypertension    HPI Zahraa presents for a follow up for hypertension, hyperlipidemia, depression, anxiety and osteoarthritis. Hypertension -- stable, takes benazepril  Depression and anxiety takes sertraline daily and alprazolam prn  Osteoarthritis -- takes tramadol BID as needed.  Diabetes -- A1c is 7.0, taking steglatro, working on diet and lifestyle modifications   Current Medication: Outpatient Encounter Medications as of 05/07/2022  Medication Sig   benazepril (LOTENSIN) 20 MG tablet Take 1 tablet (20 mg total) by mouth daily.   ertugliflozin L-PyroglutamicAc (STEGLATRO) 5 MG TABS tablet Take 1 tablet (5 mg total) by mouth daily before breakfast. E11.65   Estradiol 10 MCG TABS vaginal tablet Place 1 tablet (10 mcg total) vaginally 2 (two) times a week. Start after completion of the daily dose x 14 days   nystatin cream (MYCOSTATIN) Apply 1 application topically 2 (two) times daily.   omeprazole (PRILOSEC) 40 MG capsule Take 1 capsule (40 mg total) by mouth 2 (two) times daily.   [DISCONTINUED] ALPRAZolam (XANAX) 0.5 MG tablet Take half to one tab as needed a day ( 3 months supply   [DISCONTINUED] INTRAROSA 6.5 MG INST Place 6.5 mg vaginally at bedtime.   [DISCONTINUED] sertraline (ZOLOFT) 100 MG tablet Take 1 tablet (100 mg total) by mouth daily.   [DISCONTINUED] traMADol (ULTRAM) 50 MG tablet Take one tab po bid for pain prn only   [DISCONTINUED] venlafaxine XR (EFFEXOR-XR) 37.5 MG 24 hr capsule Take 1 capsule (37.5 mg total) by mouth daily with breakfast.   ALPRAZolam (XANAX) 0.5 MG tablet Take half to one tab as needed a day ( 3 months supply   sertraline (ZOLOFT) 100 MG tablet  Take 1 tablet (100 mg total) by mouth daily.   traMADol (ULTRAM) 50 MG tablet Take one tab po bid for pain prn only   No facility-administered encounter medications on file as of 05/07/2022.    Surgical History: Past Surgical History:  Procedure Laterality Date   CHOLECYSTECTOMY  2000   colon polyectomy     COLONOSCOPY WITH PROPOFOL N/A 03/31/2018   Procedure: COLONOSCOPY WITH PROPOFOL;  Surgeon: Jonathon Bellows, MD;  Location: [facility] ENDOSCOPY;  Service: Gastroenterology;  Laterality: N/A;   COLONOSCOPY WITH PROPOFOL N/A 10/02/2021   Procedure: COLONOSCOPY WITH PROPOFOL;  Surgeon: Jonathon Bellows, MD;  Location: [facility] ENDOSCOPY;  Service: Gastroenterology;  Laterality: N/A;   DENTAL SURGERY     FOOT SURGERY      Medical History: Past Medical History:  Diagnosis Date   Anxiety    Asthma    Depression    Environmental allergies    Hyperlipidemia    Hypertension    Rheumatoid arthritis ([place])    Sleep apnea 2019   Borderline   Type 2 diabetes mellitus ([place])     Family History: Family History  Problem Relation Age of Onset   Diabetes Mother    COPD Mother    Uterine cancer Mother 31   Cancer Sister 28       Bile Duct   Stroke Sister    Breast cancer Neg Hx     Social History   Socioeconomic History   Marital status: Married    Spouse  name: Not on file   Number of children: Not on file   Years of education: Not on file   Highest education level: Not on file  Occupational History   Not on file  Tobacco Use   Smoking status: Never   Smokeless tobacco: Never  Vaping Use   Vaping Use: Never used  Substance and Sexual Activity   Alcohol use: Yes    Comment: occ.   Drug use: No   Sexual activity: Yes    Birth control/protection: Post-menopausal  Other Topics Concern   Not on file  Social History Narrative   Not on file   Social Determinants of Health   Financial Resource Strain: Not on file  Food Insecurity: Not on file  Transportation Needs: Not on file  Physical  Activity: Not on file  Stress: Not on file  Social Connections: Not on file  Intimate Partner Violence: Not on file      Review of Systems  Constitutional:  Negative for chills, fatigue and unexpected weight change.  HENT:  Negative for congestion, rhinorrhea, sneezing and sore throat.   Eyes:  Negative for redness.  Respiratory:  Negative for cough, chest tightness and shortness of breath.   Cardiovascular: Negative.  Negative for chest pain and palpitations.  Gastrointestinal: Negative.  Negative for abdominal pain, constipation, diarrhea, nausea and vomiting.  Genitourinary:  Negative for dysuria and frequency.  Musculoskeletal: Negative.  Negative for arthralgias, back pain, joint swelling and neck pain.  Skin: Negative.  Negative for rash.  Neurological: Negative.  Negative for tremors and numbness.  Hematological:  Negative for adenopathy. Does not bruise/bleed easily.  Psychiatric/Behavioral:  Positive for behavioral problems (Depression) and sleep disturbance. Negative for self-injury and suicidal ideas. The patient is nervous/anxious.     Vital Signs: BP (!) 141/65   Pulse 79   Temp (!) 97.5 F (36.4 C)   Resp 16   Ht '5\' 3"'$  (1.6 m)   Wt 225 lb 3.2 oz (102.2 kg)   SpO2 98%   BMI 39.89 kg/m    Physical Exam Vitals reviewed.  Constitutional:      General: She is not in acute distress.    Appearance: Normal appearance. She is obese. She is not ill-appearing.  HENT:     Head: Normocephalic and atraumatic.  Eyes:     Pupils: Pupils are equal, round, and reactive to light.  Cardiovascular:     Rate and Rhythm: Normal rate and regular rhythm.  Pulmonary:     Effort: Pulmonary effort is normal. No respiratory distress.  Neurological:     Mental Status: She is alert and oriented to person, place, and time.     Cranial Nerves: No cranial nerve deficit.     Coordination: Coordination normal.     Gait: Gait normal.  Psychiatric:        Mood and Affect: Mood normal.         Behavior: Behavior normal.        Assessment/Plan: 1. Type 2 diabetes mellitus with hyperglycemia, without long-term current use of insulin (HCC) A1c elevated 7.0 today, no significant change. Continue steglatro as prescribed. Continue working on diet and lifestyle modifications as discussed - POCT glycosylated hemoglobin (Hb A1C)  2. Essential (primary) hypertension Stable, continue benazepril as prescribed.   3. Primary localized osteoarthrosis Continue tramadol prn as prescribed, refills ordered. Follow up in 3 months for additional refill - traMADol (ULTRAM) 50 MG tablet; Take one tab po bid for pain prn only  Dispense:  180 tablet; Refill: 0  4. Generalized anxiety disorder Continue sertraline and prn alprazolam as prescribed. Refills ordered. Follow up in 3 months for additional refill.  - sertraline (ZOLOFT) 100 MG tablet; Take 1 tablet (100 mg total) by mouth daily.  Dispense: 90 tablet; Refill: 1 - ALPRAZolam (XANAX) 0.5 MG tablet; Take half to one tab as needed a day ( 3 months supply  Dispense: 90 tablet; Refill: 0   General Counseling: Elma verbalizes understanding of the findings of todays visit and agrees with plan of treatment. I have discussed any further diagnostic evaluation that may be needed or ordered today. We also reviewed her medications today. she has been encouraged to call the office with any questions or concerns that should arise related to todays visit.    Orders Placed This Encounter  Procedures   POCT glycosylated hemoglobin (Hb A1C)    Meds ordered this encounter  Medications   traMADol (ULTRAM) 50 MG tablet    Sig: Take one tab po bid for pain prn only    Dispense:  180 tablet    Refill:  0    90 day supply please.   sertraline (ZOLOFT) 100 MG tablet    Sig: Take 1 tablet (100 mg total) by mouth daily.    Dispense:  90 tablet    Refill:  1   ALPRAZolam (XANAX) 0.5 MG tablet    Sig: Take half to one tab as needed a day ( 3 months  supply    Dispense:  90 tablet    Refill:  0    90 day supply.    Return in about 3 months (around 08/07/2022) for F/U, med refill, [place] PCP.   Total time spent:30 Minutes Time spent includes review of chart, medications, test results, and follow up plan with the patient.   [place] Controlled Substance Database was reviewed by me.  This patient was seen by [name], FNP-C in collaboration with Dr. Clayborn Bigness as a part of collaborative care agreement.   Slayton Lubitz R. Valetta Fuller, MSN, FNP-C Internal medicine

## 2022-05-08 ENCOUNTER — Other Ambulatory Visit: Payer: Self-pay

## 2022-05-08 DIAGNOSIS — I10 Essential (primary) hypertension: Secondary | ICD-10-CM

## 2022-05-08 MED ORDER — BENAZEPRIL HCL 20 MG PO TABS: 20.0000 mg | ORAL_TABLET | Freq: Every day | ORAL | 3 refills | Status: DC

## 2022-06-19 ENCOUNTER — Other Ambulatory Visit: Payer: Self-pay | Admitting: Nurse Practitioner

## 2022-06-19 ENCOUNTER — Encounter: Payer: Self-pay | Admitting: Nurse Practitioner

## 2022-06-19 ENCOUNTER — Ambulatory Visit: Payer: BC Managed Care – PPO | Admitting: Nurse Practitioner

## 2022-06-19 ENCOUNTER — Other Ambulatory Visit: Payer: Self-pay

## 2022-06-19 ENCOUNTER — Ambulatory Visit
Admission: RE | Admit: 2022-06-19 | Discharge: 2022-06-19 | Disposition: A | Discharge status: Home / Self Care | Payer: BC Managed Care – PPO | Source: Ambulatory Visit | Attending: Nurse Practitioner | Admitting: Nurse Practitioner

## 2022-06-19 VITALS — BP 134/73 | HR 78 | Temp 98.3°F | Resp 16 | Ht 63.0 in | Wt 221.0 lb

## 2022-06-19 DIAGNOSIS — M7031 Other bursitis of elbow, right elbow: Secondary | ICD-10-CM | POA: Diagnosis not present

## 2022-06-19 DIAGNOSIS — M1991 Primary osteoarthritis, unspecified site: Secondary | ICD-10-CM

## 2022-06-19 DIAGNOSIS — M25511 Pain in right shoulder: Secondary | ICD-10-CM | POA: Insufficient documentation

## 2022-06-19 DIAGNOSIS — E1165 Type 2 diabetes mellitus with hyperglycemia: Secondary | ICD-10-CM

## 2022-06-19 DIAGNOSIS — M25521 Pain in right elbow: Secondary | ICD-10-CM | POA: Diagnosis not present

## 2022-06-19 MED ORDER — STEGLATRO 5 MG PO TABS: 5.0000 mg | ORAL_TABLET | Freq: Every day | ORAL | 3 refills | Status: DC

## 2022-06-19 MED ORDER — PREDNISONE 10 MG (21) PO TBPK: ORAL_TABLET | ORAL | 0 refills | Status: DC

## 2022-06-19 NOTE — Progress Notes (Signed)
[facility] [place], [place] [address]  Internal MEDICINE  Office Visit Note  Patient Name: Kristen Sparks  [id number]  [id number]  Date of Service: 06/19/2022  Chief Complaint  Patient presents with   Bursitis    Along with right arm swelling, some knots in arm     HPI Kristen Sparks presents for an acute sick visit for right arm and elbow swelling.  Right elbow is achy but the swollen area is not tender.  Worsening over past 3 days, radiates to up to shoulder and down to thumb.  The pain started in the right shoulder first.       Current Medication:  Outpatient Encounter Medications as of 06/19/2022  Medication Sig   ALPRAZolam (XANAX) 0.5 MG tablet Take half to one tab as needed a day ( 3 months supply   benazepril (LOTENSIN) 20 MG tablet Take 1 tablet (20 mg total) by mouth daily.   Estradiol 10 MCG TABS vaginal tablet Place 1 tablet (10 mcg total) vaginally 2 (two) times a week. Start after completion of the daily dose x 14 days   nystatin cream (MYCOSTATIN) Apply 1 application topically 2 (two) times daily.   omeprazole (PRILOSEC) 40 MG capsule Take 1 capsule (40 mg total) by mouth 2 (two) times daily.   predniSONE (STERAPRED UNI-PAK 21 TAB) 10 MG (21) TBPK tablet Use as directed for 6 days   sertraline (ZOLOFT) 100 MG tablet Take 1 tablet (100 mg total) by mouth daily.   traMADol (ULTRAM) 50 MG tablet Take one tab po bid for pain prn only   [DISCONTINUED] ertugliflozin L-PyroglutamicAc (STEGLATRO) 5 MG TABS tablet Take 1 tablet (5 mg total) by mouth daily before breakfast. E11.65   No facility-administered encounter medications on file as of 06/19/2022.      Medical History: Past Medical History:  Diagnosis Date   Anxiety    Asthma    Depression    Environmental allergies    Hyperlipidemia    Hypertension    Rheumatoid arthritis (HCC)    Sleep apnea 2019   Borderline   Type 2 diabetes mellitus (HCC)      Vital Signs: BP 134/73    Pulse 78   Temp 98.3 F (36.8 C)   Resp 16   Ht '5\' 3"'$  (1.6 m)   Wt 221 lb (100.2 kg)   SpO2 97%   BMI 39.15 kg/m    Review of Systems  Constitutional:  Negative for fatigue.  Respiratory:  Negative for cough, chest tightness, shortness of breath and wheezing.   Cardiovascular: Negative.  Negative for chest pain and palpitations.  Musculoskeletal:  Positive for arthralgias (right shoulder, right elbow), joint swelling (upper arm above right elbow.) and myalgias (right upper arm).    Physical Exam Vitals reviewed.  Constitutional:      General: She is not in acute distress.    Appearance: Normal appearance. She is obese. She is not ill-appearing.  HENT:     Head: Normocephalic and atraumatic.  Eyes:     Pupils: Pupils are equal, round, and reactive to light.  Cardiovascular:     Rate and Rhythm: Normal rate and regular rhythm.  Musculoskeletal:        General: Swelling (right upper arm) present.     Right shoulder: Swelling and tenderness present. Decreased range of motion. Decreased strength.     Right upper arm: Swelling and edema present.     Right elbow: Swelling present. Decreased range of motion.  Left elbow: Normal.  Neurological:     Mental Status: She is alert and oriented to person, place, and time.  Psychiatric:        Mood and Affect: Mood normal.        Behavior: Behavior normal.       Assessment/Plan: 1. Radiohumeral bursitis of right elbow Xrays ordered and prednisone taper - DG Shoulder Right; Future - DG ELBOW COMPLETE RIGHT (3+VIEW); Future - predniSONE (STERAPRED UNI-PAK 21 TAB) 10 MG (21) TBPK tablet; Use as directed for 6 days  Dispense: 21 tablet; Refill: 0  2. Acute pain of right shoulder Xray ordered - DG Shoulder Right; Future - predniSONE (STERAPRED UNI-PAK 21 TAB) 10 MG (21) TBPK tablet; Use as directed for 6 days  Dispense: 21 tablet; Refill: 0   General Counseling: Tahni verbalizes understanding of the findings of todays visit  and agrees with plan of treatment. I have discussed any further diagnostic evaluation that may be needed or ordered today. We also reviewed her medications today. she has been encouraged to call the office with any questions or concerns that should arise related to todays visit.    Counseling:    Orders Placed This Encounter  Procedures   DG Shoulder Right   DG ELBOW COMPLETE RIGHT (3+VIEW)    Meds ordered this encounter  Medications   predniSONE (STERAPRED UNI-PAK 21 TAB) 10 MG (21) TBPK tablet    Sig: Use as directed for 6 days    Dispense:  21 tablet    Refill:  0    Return if symptoms worsen or fail to improve.  [place] Controlled Substance Database was reviewed by me for overdose risk score (ORS)  Time spent:20 Minutes Time spent with patient included reviewing progress notes, labs, imaging studies, and discussing plan for follow up.   This patient was seen by [name], FNP-C in collaboration with Dr. Clayborn Bigness as a part of collaborative care agreement.  Oline Belk R. Valetta Fuller, MSN, FNP-C Internal Medicine

## 2022-06-21 ENCOUNTER — Other Ambulatory Visit: Payer: Self-pay | Admitting: Internal Medicine

## 2022-06-21 DIAGNOSIS — E1165 Type 2 diabetes mellitus with hyperglycemia: Secondary | ICD-10-CM

## 2022-06-28 ENCOUNTER — Telehealth: Payer: Self-pay

## 2022-06-28 NOTE — Telephone Encounter (Signed)
Left message for patient to give clinic a call.

## 2022-06-28 NOTE — Progress Notes (Signed)
Please call patient and let her know that her right shoulder xray showed degenerative arthritis as well as a lucency that could be a minimally displaced fracture but could be nothing. To rule out fracture, I do recommended seeing orthopedic. I can send a referral locally or to a specific specialist if she has a preference.

## 2022-06-28 NOTE — Progress Notes (Signed)
Also right elbow xray was negative for any acute injury or fracture. Her pain in the elbow is most likely originating from the right shoulder

## 2022-07-24 ENCOUNTER — Telehealth (INDEPENDENT_AMBULATORY_CARE_PROVIDER_SITE_OTHER): Payer: BC Managed Care – PPO | Admitting: Nurse Practitioner

## 2022-07-24 ENCOUNTER — Ambulatory Visit (INDEPENDENT_AMBULATORY_CARE_PROVIDER_SITE_OTHER): Payer: BC Managed Care – PPO

## 2022-07-24 ENCOUNTER — Encounter: Payer: Self-pay | Admitting: Nurse Practitioner

## 2022-07-24 VITALS — Resp 16 | Ht 63.0 in | Wt 200.0 lb

## 2022-07-24 DIAGNOSIS — J029 Acute pharyngitis, unspecified: Secondary | ICD-10-CM

## 2022-07-24 LAB — POCT RAPID STREP A (OFFICE): Rapid Strep A Screen: NEGATIVE

## 2022-07-24 MED ORDER — AMOXICILLIN-POT CLAVULANATE 875-125 MG PO TABS: 1.0000 | ORAL_TABLET | Freq: Two times a day (BID) | ORAL | 0 refills | Status: AC

## 2022-07-24 NOTE — Progress Notes (Signed)
[facility] [place], [place] [address]  Internal MEDICINE  Telephone Visit  Patient Name: Kristen Sparks  [id number]  [id number]  Date of Service: 07/24/2022  I connected with the patient at 1000 by telephone and verified the patients identity using two identifiers.   I discussed the limitations, risks, security and privacy concerns of performing an evaluation and management service by telephone and the availability of in person appointments. I also discussed with the patient that there may be a patient responsible charge related to the service.  The patient expressed understanding and agrees to proceed.    Chief Complaint  Patient presents with   Telephone Screen    Left side of throat hurts, jaw and ear pain. Covid test neg.    Telephone Assessment    HPI Kristen Sparks presents for a telehealth virtual visit for left side of throat hurts, jaw and ear pain. Covid test was negative.  --onset of symptoms was last night.  --cough, sinus drainage, painful swallowing, chills.    Current Medication: Outpatient Encounter Medications as of 07/24/2022  Medication Sig   ALPRAZolam (XANAX) 0.5 MG tablet Take half to one tab as needed a day ( 3 months supply   amoxicillin-clavulanate (AUGMENTIN) 875-125 MG tablet Take 1 tablet by mouth 2 (two) times daily for 10 days.   benazepril (LOTENSIN) 20 MG tablet Take 1 tablet (20 mg total) by mouth daily.   ertugliflozin L-PyroglutamicAc (STEGLATRO) 5 MG TABS tablet Take 1 tablet (5 mg total) by mouth daily before breakfast. E11.65   Estradiol 10 MCG TABS vaginal tablet Place 1 tablet (10 mcg total) vaginally 2 (two) times a week. Start after completion of the daily dose x 14 days   nystatin cream (MYCOSTATIN) Apply 1 application topically 2 (two) times daily.   omeprazole (PRILOSEC) 40 MG capsule Take 1 capsule (40 mg total) by mouth 2 (two) times daily.   predniSONE (STERAPRED UNI-PAK 21 TAB) 10 MG (21) TBPK tablet Use as directed  for 6 days   sertraline (ZOLOFT) 100 MG tablet Take 1 tablet (100 mg total) by mouth daily.   traMADol (ULTRAM) 50 MG tablet Take one tab po bid for pain prn only   No facility-administered encounter medications on file as of 07/24/2022.    Surgical History: Past Surgical History:  Procedure Laterality Date   CHOLECYSTECTOMY  2000   colon polyectomy     COLONOSCOPY WITH PROPOFOL N/A 03/31/2018   Procedure: COLONOSCOPY WITH PROPOFOL;  Surgeon: Jonathon Bellows, MD;  Location: [facility] ENDOSCOPY;  Service: Gastroenterology;  Laterality: N/A;   COLONOSCOPY WITH PROPOFOL N/A 10/02/2021   Procedure: COLONOSCOPY WITH PROPOFOL;  Surgeon: Jonathon Bellows, MD;  Location: [facility] ENDOSCOPY;  Service: Gastroenterology;  Laterality: N/A;   DENTAL SURGERY     FOOT SURGERY      Medical History: Past Medical History:  Diagnosis Date   Anxiety    Asthma    Depression    Environmental allergies    Hyperlipidemia    Hypertension    Rheumatoid arthritis ([place])    Sleep apnea 2019   Borderline   Type 2 diabetes mellitus ([place])     Family History: Family History  Problem Relation Age of Onset   Diabetes Mother    COPD Mother    Uterine cancer Mother 84   Cancer Sister 64       Bile Duct   Stroke Sister    Breast cancer Neg Hx     Social History   Socioeconomic  History   Marital status: Married    Spouse name: Not on file   Number of children: Not on file   Years of education: Not on file   Highest education level: Not on file  Occupational History   Not on file  Tobacco Use   Smoking status: Never   Smokeless tobacco: Never  Vaping Use   Vaping Use: Never used  Substance and Sexual Activity   Alcohol use: Yes    Comment: occ.   Drug use: No   Sexual activity: Yes    Birth control/protection: Post-menopausal  Other Topics Concern   Not on file  Social History Narrative   Not on file   Social Determinants of Health   Financial Resource Strain: Not on file  Food Insecurity: Not on file   Transportation Needs: Not on file  Physical Activity: Not on file  Stress: Not on file  Social Connections: Not on file  Intimate Partner Violence: Not on file      Review of Systems  Constitutional:  Positive for chills. Negative for fatigue and fever.  HENT:  Positive for congestion, postnasal drip, sore throat and trouble swallowing. Negative for rhinorrhea, sinus pressure and sinus pain.   Respiratory:  Positive for cough. Negative for chest tightness, shortness of breath and wheezing.   Cardiovascular: Negative.  Negative for chest pain and palpitations.  Gastrointestinal:  Negative for diarrhea, nausea and vomiting.  Neurological:  Negative for headaches.    Vital Signs: Resp 16   Ht '5\' 3"'$  (1.6 m)   Wt 200 lb (90.7 kg)   BMI 35.43 kg/m    Observation/Objective: She is alert and oriented and engages in conversation appropriately. No acute distress noted.     Assessment/Plan: 1. Pharyngitis, unspecified etiology Patient instructed to come to office to be tested for strep throat and then tx will e prescribed.  Strep test was negative, augmentin prescribed.   General Counseling: Kristen Sparks verbalizes understanding of the findings of today's phone visit and agrees with plan of treatment. I have discussed any further diagnostic evaluation that may be needed or ordered today. We also reviewed her medications today. she has been encouraged to call the office with any questions or concerns that should arise related to todays visit.  Return for instructed patient to come to office to be tested for strep.   No orders of the defined types were placed in this encounter.   Meds ordered this encounter  Medications   amoxicillin-clavulanate (AUGMENTIN) 875-125 MG tablet    Sig: Take 1 tablet by mouth 2 (two) times daily for 10 days.    Dispense:  20 tablet    Refill:  0    Time spent:30 Minutes Time spent with patient included reviewing progress notes, labs, imaging studies,  and discussing plan for follow up.  [place] Controlled Substance Database was reviewed by me for overdose risk score (ORS) if appropriate.  This patient was seen by [name], FNP-C in collaboration with Dr. Clayborn Bigness as a part of collaborative care agreement.  Alexandr Yaworski R. Valetta Fuller, MSN, FNP-C Internal medicine

## 2022-08-02 ENCOUNTER — Ambulatory Visit (INDEPENDENT_AMBULATORY_CARE_PROVIDER_SITE_OTHER): Payer: BC Managed Care – PPO | Admitting: Nurse Practitioner

## 2022-08-02 ENCOUNTER — Encounter: Payer: Self-pay | Admitting: Nurse Practitioner

## 2022-08-02 VITALS — BP 135/70 | HR 75 | Temp 97.6°F | Resp 16 | Ht 63.0 in | Wt 222.2 lb

## 2022-08-02 DIAGNOSIS — F411 Generalized anxiety disorder: Secondary | ICD-10-CM

## 2022-08-02 DIAGNOSIS — I10 Essential (primary) hypertension: Secondary | ICD-10-CM | POA: Diagnosis not present

## 2022-08-02 DIAGNOSIS — M1991 Primary osteoarthritis, unspecified site: Secondary | ICD-10-CM

## 2022-08-02 MED ORDER — ALPRAZOLAM 0.5 MG PO TABS: ORAL_TABLET | ORAL | 0 refills | Status: DC

## 2022-08-02 MED ORDER — TRAMADOL HCL 50 MG PO TABS: ORAL_TABLET | ORAL | 0 refills | Status: DC

## 2022-08-02 NOTE — Progress Notes (Signed)
[facility] [name], [place] [address]  Internal MEDICINE  Office Visit Note  Patient Name: Kristen Sparks  [id number]  [id number]  Date of Service: 08/02/2022  Chief Complaint  Patient presents with   Depression   Diabetes   Hypertension   Hyperlipidemia    HPI Kristen Sparks presents for a follow up visit for hypertension, medication refills.  Recent pharyngitis -- was treated with augmentin. Feeling much better. Symptoms have resolved.  Hypertension -- blood pressure improved when rechecked. Takes benazepril.  Due for refills of alprazolam for anxiety.  Due for refills of tramadol for osteoarthritis of multiple joints.     Current Medication: Outpatient Encounter Medications as of 08/02/2022  Medication Sig   [EXPIRED] amoxicillin-clavulanate (AUGMENTIN) 875-125 MG tablet Take 1 tablet by mouth 2 (two) times daily for 10 days.   benazepril (LOTENSIN) 20 MG tablet Take 1 tablet (20 mg total) by mouth daily.   ertugliflozin L-PyroglutamicAc (STEGLATRO) 5 MG TABS tablet Take 1 tablet (5 mg total) by mouth daily before breakfast. E11.65   Estradiol 10 MCG TABS vaginal tablet Place 1 tablet (10 mcg total) vaginally 2 (two) times a week. Start after completion of the daily dose x 14 days   nystatin cream (MYCOSTATIN) Apply 1 application topically 2 (two) times daily.   omeprazole (PRILOSEC) 40 MG capsule Take 1 capsule (40 mg total) by mouth 2 (two) times daily.   predniSONE (STERAPRED UNI-PAK 21 TAB) 10 MG (21) TBPK tablet Use as directed for 6 days   sertraline (ZOLOFT) 100 MG tablet Take 1 tablet (100 mg total) by mouth daily.   [DISCONTINUED] ALPRAZolam (XANAX) 0.5 MG tablet Take half to one tab as needed a day ( 3 months supply   [DISCONTINUED] traMADol (ULTRAM) 50 MG tablet Take one tab po bid for pain prn only   ALPRAZolam (XANAX) 0.5 MG tablet Take half to one tab as needed a day ( 3 months supply   traMADol (ULTRAM) 50 MG tablet Take one tab po bid for pain  prn only   No facility-administered encounter medications on file as of 08/02/2022.    Surgical History: Past Surgical History:  Procedure Laterality Date   CHOLECYSTECTOMY  2000   colon polyectomy     COLONOSCOPY WITH PROPOFOL N/A 03/31/2018   Procedure: COLONOSCOPY WITH PROPOFOL;  Surgeon: Jonathon Bellows, MD;  Location: [facility] ENDOSCOPY;  Service: Gastroenterology;  Laterality: N/A;   COLONOSCOPY WITH PROPOFOL N/A 10/02/2021   Procedure: COLONOSCOPY WITH PROPOFOL;  Surgeon: Jonathon Bellows, MD;  Location: [facility] END[facility]COPY;  Service: Gastroenterology;  Laterality: N/A;   DENTAL SURGERY     FOOT SURGERY      Medical History: Past Medical History:  Diagnosis Date   Anxiety    Asthma    Depression    Environmental allergies    Hyperlipidemia    Hypertension    Rheumatoid arthritis ([place])    Sleep apnea 2019   Borderline   Type 2 diabetes mellitus ([place])     Family History: Family History  Problem Relation Age of Onset   Diabetes Mother    COPD Mother    Uterine cancer Mother 12   Cancer Sister 11       Bile Duct   Stroke Sister    Breast cancer Neg Hx     Social History   Socioeconomic History   Marital status: Married    Spouse name: Not on file   Number of children: Not on file   Years of  education: Not on file   Highest education level: Not on file  Occupational History   Not on file  Tobacco Use   Smoking status: Never   Smokeless tobacco: Never  Vaping Use   Vaping Use: Never used  Substance and Sexual Activity   Alcohol use: Yes    Comment: occ.   Drug use: No   Sexual activity: Yes    Birth control/protection: Post-menopausal  Other Topics Concern   Not on file  Social History Narrative   Not on file   Social Determinants of Health   Financial Resource Strain: Not on file  Food Insecurity: Not on file  Transportation Needs: Not on file  Physical Activity: Not on file  Stress: Not on file  Social Connections: Not on file  Intimate Partner Violence:  Not on file      Review of Systems  Constitutional: Negative.  Negative for chills, fatigue and unexpected weight change.  HENT:  Negative for congestion, rhinorrhea, sneezing and sore throat.   Eyes:  Negative for redness.  Respiratory: Negative.  Negative for cough, chest tightness, shortness of breath and wheezing.   Cardiovascular: Negative.  Negative for chest pain and palpitations.  Gastrointestinal: Negative.  Negative for abdominal pain, constipation, diarrhea, nausea and vomiting.  Genitourinary:  Negative for dysuria and frequency.  Musculoskeletal:  Positive for arthralgias. Negative for back pain, joint swelling and neck pain.  Skin: Negative.  Negative for rash.  Neurological: Negative.  Negative for tremors and numbness.  Hematological:  Negative for adenopathy. Does not bruise/bleed easily.  Psychiatric/Behavioral:  Positive for behavioral problems (Depression) and sleep disturbance. Negative for self-injury and suicidal ideas. The patient is nervous/anxious.     Vital Signs: BP 135/70 Comment: 174/87  Pulse 75   Temp 97.6 F (36.4 C)   Resp 16   Ht 5' 3"$  (1.6 m)   Wt 222 lb 3.2 oz (100.8 kg)   SpO2 95%   BMI 39.36 kg/m    Physical Exam Vitals reviewed.  Constitutional:      General: She is not in acute distress.    Appearance: Normal appearance. She is obese. She is not ill-appearing.  HENT:     Head: Normocephalic and atraumatic.  Eyes:     Pupils: Pupils are equal, round, and reactive to light.  Cardiovascular:     Rate and Rhythm: Normal rate and regular rhythm.  Pulmonary:     Effort: Pulmonary effort is normal. No respiratory distress.  Neurological:     Mental Status: She is alert and oriented to person, place, and time.  Psychiatric:        Mood and Affect: Mood normal.        Behavior: Behavior normal.        Assessment/Plan: 1. Essential (primary) hypertension Stable, continue benazepril as prescribed.   2. Primary localized  osteoarthrosis Stable, continue tramadol as prescribed. Follow up in 3 months for additional refills  - traMADol (ULTRAM) 50 MG tablet; Take one tab po bid for pain prn only  Dispense: 180 tablet; Refill: 0  3. Generalized anxiety disorder Stable, continue alprazolam prn as prescribed. Follow up in 3 months for additional refills.  - ALPRAZolam (XANAX) 0.5 MG tablet; Take half to one tab as needed a day ( 3 months supply  Dispense: 90 tablet; Refill: 0   General Counseling: Trudi verbalizes understanding of the findings of todays visit and agrees with plan of treatment. I have discussed any further diagnostic evaluation that may be needed  or ordered today. We also reviewed her medications today. she has been encouraged to call the office with any questions or concerns that should arise related to todays visit.    No orders of the defined types were placed in this encounter.   Meds ordered this encounter  Medications   traMADol (ULTRAM) 50 MG tablet    Sig: Take one tab po bid for pain prn only    Dispense:  180 tablet    Refill:  0    90 day supply please.   ALPRAZolam (XANAX) 0.5 MG tablet    Sig: Take half to one tab as needed a day ( 3 months supply    Dispense:  90 tablet    Refill:  0    90 day supply.    Return in about 3 months (around 10/24/2022) for F/U, anxiety med refill, pain med refill, Dawnmarie Breon PCP.   Total time spent:30 Minutes Time spent includes review of chart, medications, test results, and follow up plan with the patient.   [place] Controlled Substance Database was reviewed by me.  This patient was seen by [name], FNP-C in collaboration with Dr. Clayborn Bigness as a part of collaborative care agreement.   Adrieana Fennelly R. Valetta Fuller, MSN, FNP-C Internal medicine

## 2022-08-11 ENCOUNTER — Encounter: Payer: Self-pay | Admitting: Nurse Practitioner

## 2022-08-12 ENCOUNTER — Other Ambulatory Visit: Payer: Self-pay | Admitting: Certified Nurse Midwife

## 2022-09-15 ENCOUNTER — Other Ambulatory Visit: Payer: Self-pay | Admitting: Nurse Practitioner

## 2022-09-15 DIAGNOSIS — E1165 Type 2 diabetes mellitus with hyperglycemia: Secondary | ICD-10-CM

## 2022-09-20 ENCOUNTER — Telehealth: Payer: Self-pay

## 2022-09-20 NOTE — Telephone Encounter (Signed)
PA was done for [facility].

## 2022-09-20 NOTE — Telephone Encounter (Signed)
Spoke to patient and let her know the PA was approved for [employer].

## 2022-10-04 ENCOUNTER — Telehealth: Payer: Self-pay

## 2022-10-04 NOTE — Telephone Encounter (Signed)
Completed P.A. for patient's [employer]. 

## 2022-10-23 ENCOUNTER — Other Ambulatory Visit: Payer: Self-pay | Admitting: Nurse Practitioner

## 2022-10-23 DIAGNOSIS — Z1231 Encounter for screening mammogram for malignant neoplasm of breast: Secondary | ICD-10-CM

## 2022-10-24 ENCOUNTER — Ambulatory Visit
Admission: RE | Admit: 2022-10-24 | Discharge: 2022-10-24 | Disposition: A | Discharge status: Home / Self Care | Payer: BC Managed Care – PPO | Source: Ambulatory Visit | Attending: Nurse Practitioner | Admitting: Nurse Practitioner

## 2022-10-24 DIAGNOSIS — Z1231 Encounter for screening mammogram for malignant neoplasm of breast: Secondary | ICD-10-CM | POA: Insufficient documentation

## 2022-10-29 ENCOUNTER — Ambulatory Visit: Payer: BC Managed Care – PPO | Admitting: Nurse Practitioner

## 2022-11-16 ENCOUNTER — Ambulatory Visit: Payer: BC Managed Care – PPO | Admitting: Nurse Practitioner

## 2022-11-22 ENCOUNTER — Ambulatory Visit (INDEPENDENT_AMBULATORY_CARE_PROVIDER_SITE_OTHER): Payer: BC Managed Care – PPO | Admitting: Nurse Practitioner

## 2022-11-22 ENCOUNTER — Other Ambulatory Visit
Admission: RE | Admit: 2022-11-22 | Discharge: 2022-11-22 | Disposition: A | Discharge status: Home / Self Care | Payer: BC Managed Care – PPO | Source: Ambulatory Visit | Attending: Nurse Practitioner | Admitting: Nurse Practitioner

## 2022-11-22 ENCOUNTER — Encounter: Payer: Self-pay | Admitting: Nurse Practitioner

## 2022-11-22 VITALS — BP 138/85 | HR 73 | Temp 98.4°F | Resp 16 | Ht 63.0 in | Wt 220.0 lb

## 2022-11-22 DIAGNOSIS — E538 Deficiency of other specified B group vitamins: Secondary | ICD-10-CM | POA: Insufficient documentation

## 2022-11-22 DIAGNOSIS — Z0001 Encounter for general adult medical examination with abnormal findings: Secondary | ICD-10-CM | POA: Diagnosis not present

## 2022-11-22 DIAGNOSIS — E1165 Type 2 diabetes mellitus with hyperglycemia: Secondary | ICD-10-CM | POA: Diagnosis not present

## 2022-11-22 DIAGNOSIS — E782 Mixed hyperlipidemia: Secondary | ICD-10-CM | POA: Diagnosis not present

## 2022-11-22 DIAGNOSIS — I10 Essential (primary) hypertension: Secondary | ICD-10-CM | POA: Diagnosis not present

## 2022-11-22 DIAGNOSIS — E559 Vitamin D deficiency, unspecified: Secondary | ICD-10-CM

## 2022-11-22 LAB — FOLATE: Folate: 12.4 ng/mL (ref >5.9)

## 2022-11-22 LAB — CBC WITH DIFFERENTIAL/PLATELET
Abs Immature Granulocytes: 0.02 10*3/uL (ref 0.00–0.07)
Basophils Absolute: 0 10*3/uL (ref 0.0–0.1)
Basophils Relative: 1 %
Eosinophils Absolute: 0.2 10*3/uL (ref 0.0–0.5)
Eosinophils Relative: 3 %
HCT: 43.7 % (ref 36.0–46.0)
Hemoglobin: 14.3 g/dL (ref 12.0–15.0)
Immature Granulocytes: 0 %
Lymphocytes Relative: 36 %
Lymphs Abs: 2.5 10*3/uL (ref 0.7–4.0)
MCH: 29.7 pg (ref 26.0–34.0)
MCHC: 32.7 g/dL (ref 30.0–36.0)
MCV: 90.9 fL (ref 80.0–100.0)
Monocytes Absolute: 0.4 10*3/uL (ref 0.1–1.0)
Monocytes Relative: 5 %
Neutro Abs: 3.8 10*3/uL (ref 1.7–7.7)
Neutrophils Relative %: 55 %
Platelets: 227 10*3/uL (ref 150–400)
RBC: 4.81 MIL/uL (ref 3.87–5.11)
RDW: 13.9 % (ref 11.5–15.5)
WBC: 7 10*3/uL (ref 4.0–10.5)
nRBC: 0 % (ref 0.0–0.2)

## 2022-11-22 LAB — COMPREHENSIVE METABOLIC PANEL
ALT: 32 U/L (ref 0–44)
AST: 25 U/L (ref 15–41)
Albumin: 4 g/dL (ref 3.5–5.0)
Alkaline Phosphatase: 142 U/L — ABNORMAL HIGH (ref 38–126)
Anion gap: 9 (ref 5–15)
BUN: 15 mg/dL (ref 8–23)
CO2: 25 mmol/L (ref 22–32)
Calcium: 9.3 mg/dL (ref 8.9–10.3)
Chloride: 104 mmol/L (ref 98–111)
Creatinine, Ser: 0.58 mg/dL (ref 0.44–1.00)
GFR, Estimated: >60 mL/min (ref >60)
Glucose, Bld: 139 mg/dL — ABNORMAL HIGH (ref 70–99)
Potassium: 4.1 mmol/L (ref 3.5–5.1)
Sodium: 138 mmol/L (ref 135–145)
Total Bilirubin: 0.5 mg/dL (ref 0.3–1.2)
Total Protein: 7.3 g/dL (ref 6.5–8.1)

## 2022-11-22 LAB — FERRITIN: Ferritin: 105 ng/mL (ref 11–307)

## 2022-11-22 LAB — IRON AND TIBC
Iron: 77 ug/dL (ref 28–170)
Saturation Ratios: 22 % (ref 10.4–31.8)
TIBC: 353 ug/dL (ref 250–450)
UIBC: 276 ug/dL

## 2022-11-22 LAB — URINALYSIS, ROUTINE W REFLEX MICROSCOPIC
Bilirubin Urine: NEGATIVE
Glucose, UA: 150 mg/dL — AB
Hgb urine dipstick: NEGATIVE
Ketones, ur: NEGATIVE mg/dL
Leukocytes,Ua: NEGATIVE
Nitrite: NEGATIVE
Protein, ur: NEGATIVE mg/dL
Specific Gravity, Urine: 1.015 (ref 1.005–1.030)
pH: 6 (ref 5.0–8.0)

## 2022-11-22 LAB — LIPID PANEL
Cholesterol: 261 mg/dL — ABNORMAL HIGH (ref 0–200)
HDL: 66 mg/dL (ref >40)
LDL Cholesterol: 164 mg/dL — ABNORMAL HIGH (ref 0–99)
Total CHOL/HDL Ratio: 4 RATIO
Triglycerides: 154 mg/dL — ABNORMAL HIGH (ref <150)
VLDL: 31 mg/dL (ref 0–40)

## 2022-11-22 LAB — POCT GLYCOSYLATED HEMOGLOBIN (HGB A1C): Hemoglobin A1C: 7.5 % — AB (ref 4.0–5.6)

## 2022-11-22 LAB — VITAMIN B12: Vitamin B-12: 151 pg/mL — ABNORMAL LOW (ref 180–914)

## 2022-11-22 MED ORDER — ALPRAZOLAM 0.5 MG PO TABS
ORAL_TABLET | ORAL | 0 refills | Status: DC
Start: 2022-11-22 — End: 2023-02-19

## 2022-11-22 MED ORDER — SERTRALINE HCL 100 MG PO TABS: 100.0000 mg | ORAL_TABLET | Freq: Every day | ORAL | 1 refills | Status: DC

## 2022-11-22 MED ORDER — OMEPRAZOLE 40 MG PO CPDR
40.0000 mg | DELAYED_RELEASE_CAPSULE | Freq: Two times a day (BID) | ORAL | 3 refills | Status: DC
Start: 2022-11-22 — End: 2023-08-06

## 2022-11-22 MED ORDER — TRAMADOL HCL 50 MG PO TABS
ORAL_TABLET | ORAL | 0 refills | Status: DC
Start: 2022-11-22 — End: 2023-02-19

## 2022-11-22 NOTE — Progress Notes (Signed)
[facility] [address] [place], [place] [address]  Internal MEDICINE  Office Visit Note  Patient Name: Kristen Sparks  [id number]  [id number]  Date of Service: 11/22/2022  Chief Complaint  Patient presents with   Depression   Diabetes   Hypertension   Hyperlipidemia   Follow-up   Annual Exam    HPI Kristen Sparks presents for an annual well visit and physical exam.  Well-appearing 62 y.o. female with hypertension, asthma, GERD, diabetes, GAD, and depression  Routine CRC screening: done in April 2023 Routine mammogram: done on 10/24/22 Pap smear: done in 2023, due again in 2028 Eye exam: patient will call to schedule foot exam: done today Labs: due for routine labs  New or worsening pain: none Last lipid panel was done in June 2023, her LDL was 114. She is not taking a statin or any other cholesterol lowering medication. Target LDL should be under 100. She has hypertension but no heart disease.    Current Medication: Outpatient Encounter Medications as of 11/22/2022  Medication Sig   benazepril (LOTENSIN) 20 MG tablet Take 1 tablet (20 mg total) by mouth daily.   Estradiol 10 MCG TABS vaginal tablet Place 1 tablet (10 mcg total) vaginally 2 (two) times a week. Start after completion of the daily dose x 14 days   nystatin cream (MYCOSTATIN) Apply 1 application topically 2 (two) times daily.   STEGLATRO 5 MG TABS tablet TAKE 1 TABLET(5 MG) BY MOUTH DAILY BEFORE BREAKFAST   [DISCONTINUED] ALPRAZolam (XANAX) 0.5 MG tablet Take half to one tab as needed a day ( 3 months supply   [DISCONTINUED] omeprazole (PRILOSEC) 40 MG capsule Take 1 capsule (40 mg total) by mouth 2 (two) times daily.   [DISCONTINUED] predniSONE (STERAPRED UNI-PAK 21 TAB) 10 MG (21) TBPK tablet Use as directed for 6 days   [DISCONTINUED] sertraline (ZOLOFT) 100 MG tablet Take 1 tablet (100 mg total) by mouth daily.   [DISCONTINUED] traMADol (ULTRAM) 50 MG tablet Take one tab po bid for pain prn only    ALPRAZolam (XANAX) 0.5 MG tablet Take half to one tab as needed a day ( 3 months supply   omeprazole (PRILOSEC) 40 MG capsule Take 1 capsule (40 mg total) by mouth 2 (two) times daily.   sertraline (ZOLOFT) 100 MG tablet Take 1 tablet (100 mg total) by mouth daily.   traMADol (ULTRAM) 50 MG tablet Take one tab po bid for pain prn only   No facility-administered encounter medications on file as of 11/22/2022.    Surgical History: Past Surgical History:  Procedure Laterality Date   CHOLECYSTECTOMY  2000   colon polyectomy     COLONOSCOPY WITH PROPOFOL N/A 03/31/2018   Procedure: COLONOSCOPY WITH PROPOFOL;  Surgeon: Wyline Mood, MD;  Location: [facility] ENDOSCOPY;  Service: Gastroenterology;  Laterality: N/A;   COLONOSCOPY WITH PROPOFOL N/A 10/02/2021   Procedure: COLONOSCOPY WITH PROPOFOL;  Surgeon: Wyline Mood, MD;  Location: [facility] ENDOSCOPY;  Service: Gastroenterology;  Laterality: N/A;   DENTAL SURGERY     FOOT SURGERY      Medical History: Past Medical History:  Diagnosis Date   Anxiety    Asthma    Depression    Environmental allergies    Hyperlipidemia    Hypertension    Rheumatoid arthritis (HCC)    Sleep apnea 2019   Borderline   Type 2 diabetes mellitus (H[facility])     Family History: Family History  Problem Relation Age of Onset   Diabetes Mother  COPD Mother    Uterine cancer Mother 83   Cancer Sister 20       Bile Duct   Stroke Sister    Breast cancer Neg Hx     Social History   Socioeconomic History   Marital status: Married    Spouse name: Not on file   Number of children: Not on file   Years of education: Not on file   Highest education level: Not on file  Occupational History   Not on file  Tobacco Use   Smoking status: Never   Smokeless tobacco: Never  Vaping Use   Vaping Use: Never used  Substance and Sexual Activity   Alcohol use: Yes    Comment: occ.   Drug use: No   Sexual activity: Yes    Birth control/protection: Post-menopausal  Other  Topics Concern   Not on file  Social History Narrative   Not on file   Social Determinants of Health   Financial Resource Strain: Not on file  Food Insecurity: Not on file  Transportation Needs: Not on file  Physical Activity: Not on file  Stress: Not on file  Social Connections: Not on file  Intimate Partner Violence: Not on file      Review of Systems  Constitutional:  Negative for activity change, appetite change, chills, fatigue, fever and unexpected weight change.  HENT: Negative.  Negative for congestion, ear pain, rhinorrhea, sore throat and trouble swallowing.   Eyes: Negative.   Respiratory: Negative.  Negative for cough, chest tightness, shortness of breath and wheezing.   Cardiovascular: Negative.  Negative for chest pain.  Gastrointestinal: Negative.  Negative for abdominal pain, blood in stool, constipation, diarrhea, nausea and vomiting.  Endocrine: Negative.   Genitourinary: Negative.  Negative for difficulty urinating, dysuria, frequency, hematuria and urgency.  Musculoskeletal: Negative.  Negative for arthralgias, back pain, joint swelling, myalgias and neck pain.  Skin: Negative.  Negative for rash and wound.  Allergic/Immunologic: Negative.  Negative for immunocompromised state.  Neurological: Negative.  Negative for dizziness, seizures, numbness and headaches.  Hematological: Negative.   Psychiatric/Behavioral:  Negative for behavioral problems, self-injury and suicidal ideas. The patient is not nervous/anxious.     Vital Signs: BP 138/85 Comment: 160/94  Pulse 73   Temp 98.4 F (36.9 C)   Resp 16   Ht 5\' 3"  (1.6 m)   Wt 220 lb (99.8 kg)   SpO2 95%   BMI 38.97 kg/m    Physical Exam Vitals reviewed.  Constitutional:      General: She is awake. She is not in acute distress.    Appearance: Normal appearance. She is well-developed. She is obese. She is not ill-appearing or diaphoretic.  HENT:     Head: Normocephalic and atraumatic.     Right Ear:  Tympanic membrane, ear canal and external ear normal.     Left Ear: Tympanic membrane, ear canal and external ear normal.     Nose: Nose normal. No congestion or rhinorrhea.     Mouth/Throat:     Lips: Pink.     Mouth: Mucous membranes are moist.     Pharynx: Oropharynx is clear. Uvula midline. No oropharyngeal exudate or posterior oropharyngeal erythema.  Eyes:     General: Lids are normal. Vision grossly intact. Gaze aligned appropriately. No scleral icterus.       Right eye: No discharge.        Left eye: No discharge.     Extraocular Movements: Extraocular movements intact.  Conjunctiva/sclera: Conjunctivae normal.     Pupils: Pupils are equal, round, and reactive to light.     Funduscopic exam:    Right eye: Red reflex present.        Left eye: Red reflex present. Neck:     Thyroid: No thyromegaly.     Vascular: No carotid bruit or JVD.     Trachea: No tracheal deviation.  Cardiovascular:     Rate and Rhythm: Normal rate and regular rhythm.     Pulses: Normal pulses.     Heart sounds: Normal heart sounds, S1 normal and S2 normal. No murmur heard.    No friction rub. No gallop.  Pulmonary:     Effort: Pulmonary effort is normal. No accessory muscle usage or respiratory distress.     Breath sounds: Normal breath sounds and air entry. No stridor. No wheezing or rales.  Chest:     Chest wall: No tenderness.     Comments: Declined clinical breast exam, patient sees OB/GYN and also had a mammogram recently. Abdominal:     General: Bowel sounds are normal. There is no distension.     Palpations: Abdomen is soft. There is no shifting dullness, fluid wave, mass or pulsatile mass.     Tenderness: There is no abdominal tenderness. There is no guarding or rebound.  Musculoskeletal:        General: No tenderness or deformity. Normal range of motion.     Cervical back: Normal range of motion and neck supple.     Right lower leg: 1+ Pitting Edema present.     Left lower leg: 1+  Pitting Edema present.  Lymphadenopathy:     Cervical: No cervical adenopathy.  Skin:    General: Skin is warm and dry.     Capillary Refill: Capillary refill takes less than 2 seconds.     Coloration: Skin is not pale.     Findings: No erythema or rash.  Neurological:     Mental Status: She is alert and oriented to person, place, and time.     Cranial Nerves: No cranial nerve deficit.     Motor: No abnormal muscle tone.     Coordination: Coordination normal.     Gait: Gait normal.     Deep Tendon Reflexes: Reflexes are normal and symmetric.  Psychiatric:        Mood and Affect: Mood normal.        Behavior: Behavior normal. Behavior is cooperative.        Thought Content: Thought content normal.        Judgment: Judgment normal.        Assessment/Plan: 1. Encounter for general adult medical examination with abnormal findings Age-appropriate preventive screenings and vaccinations discussed, annual physical exam completed. Routine labs for health maintenance ordered, see below. PHM updated. Routine medication refills ordered. Medication list reviewed and updated.  - CBC with Differential/Platelet - CMP14+EGFR - Lipid Profile - B12 and Folate Panel - Vitamin D (25 hydroxy) - Iron, TIBC and Ferritin Panel - Urinalysis, Routine w reflex microscopic - Urine Microalbumin w/creat. ratio - ALPRAZolam (XANAX) 0.5 MG tablet; Take half to one tab as needed a day ( 3 months supply  Dispense: 90 tablet; Refill: 0 - omeprazole (PRILOSEC) 40 MG capsule; Take 1 capsule (40 mg total) by mouth 2 (two) times daily.  Dispense: 180 capsule; Refill: 3 - sertraline (ZOLOFT) 100 MG tablet; Take 1 tablet (100 mg total) by mouth daily.  Dispense: 90 tablet; Refill: 1 -  traMADol (ULTRAM) 50 MG tablet; Take one tab po bid for pain prn only  Dispense: 180 tablet; Refill: 0  2. Type 2 diabetes mellitus with hyperglycemia, without long-term current use of insulin (HCC) A1c elevated at 7.4, routine labs  ordered - POCT glycosylated hemoglobin (Hb A1C) - CBC with Differential/Platelet - CMP14+EGFR - Lipid Profile - B12 and Folate Panel - Vitamin D (25 hydroxy) - Iron, TIBC and Ferritin Panel - Urinalysis, Routine w reflex microscopic - Urine Microalbumin w/creat. ratio  3. Essential (primary) hypertension Continue current medications as prescribed.   4. Mixed hyperlipidemia Repeat labs ordered  Will reevaluate lipid panel, if LDL still elevated, will need to start statin therapy.  - CMP14+EGFR - Lipid Profile  5. B12 deficiency Routine lab ordered  - CBC with Differential/Platelet - B12 and Folate Panel - Iron, TIBC and Ferritin Panel  6. Vitamin D deficiency Routine lab ordered - Vitamin D (25 hydroxy)      General Counseling: Kristen Sparks verbalizes understanding of the findings of todays visit and agrees with plan of treatment. I have discussed any further diagnostic evaluation that may be needed or ordered today. We also reviewed her medications today. she has been encouraged to call the office with any questions or concerns that should arise related to todays visit.    Orders Placed This Encounter  Procedures   CBC with Differential/Platelet   CMP14+EGFR   Lipid Profile   B12 and Folate Panel   Vitamin D (25 hydroxy)   Iron, TIBC and Ferritin Panel   Urinalysis, Routine w reflex microscopic   Urine Microalbumin w/creat. ratio   POCT glycosylated hemoglobin (Hb A1C)    Meds ordered this encounter  Medications   ALPRAZolam (XANAX) 0.5 MG tablet    Sig: Take half to one tab as needed a day ( 3 months supply    Dispense:  90 tablet    Refill:  0    90 day supply.   omeprazole (PRILOSEC) 40 MG capsule    Sig: Take 1 capsule (40 mg total) by mouth 2 (two) times daily.    Dispense:  180 capsule    Refill:  3   sertraline (ZOLOFT) 100 MG tablet    Sig: Take 1 tablet (100 mg total) by mouth daily.    Dispense:  90 tablet    Refill:  1   traMADol (ULTRAM) 50 MG  tablet    Sig: Take one tab po bid for pain prn only    Dispense:  180 tablet    Refill:  0    90 day supply please.    Return in about 3 months (around 02/15/2023) for F/U, pain med refill, anxiety med refill, Xzandria Clevinger PCP.   Total time spent:30 Minutes Time spent includes review of chart, medications, test results, and follow up plan with the patient.   [place] Controlled Substance Database was reviewed by me.  This patient was seen by Sallyanne Kuster, FNP-C in collaboration with Dr. Beverely Risen as a part of collaborative care agreement.  Rashad Auld R. Tedd Sias, MSN, FNP-C Internal medicine

## 2022-11-23 LAB — MICROALBUMIN / CREATININE URINE RATIO
Creatinine, Urine: 69.4 mg/dL
Microalb Creat Ratio: <4 mg/g creat (ref 0–29)
Microalb, Ur: <3 ug/mL — ABNORMAL HIGH

## 2022-11-27 LAB — MISC LABCORP TEST (SEND OUT): Labcorp test code: 81950

## 2022-12-14 NOTE — Progress Notes (Signed)
B12 is very low, need weekly b12 injection for 4 weeks then monthly injections for 5 months then will recheck level, have her schedule nurse visit please.  CMP is grossly normal Cholesterol levels have increased significantly, need to restart cholesterol medication CBC, iron panel, folate

## 2022-12-17 ENCOUNTER — Telehealth: Payer: Self-pay | Admitting: Nurse Practitioner

## 2022-12-17 ENCOUNTER — Telehealth: Payer: Self-pay

## 2022-12-17 NOTE — Telephone Encounter (Signed)
Left message for patient to give office a call back.  

## 2022-12-18 ENCOUNTER — Ambulatory Visit (INDEPENDENT_AMBULATORY_CARE_PROVIDER_SITE_OTHER): Payer: BC Managed Care – PPO

## 2022-12-18 ENCOUNTER — Other Ambulatory Visit: Payer: Self-pay | Admitting: Nurse Practitioner

## 2022-12-18 DIAGNOSIS — E538 Deficiency of other specified B group vitamins: Secondary | ICD-10-CM | POA: Diagnosis not present

## 2022-12-18 MED ORDER — ROSUVASTATIN CALCIUM 5 MG PO TABS: 5.0000 mg | ORAL_TABLET | Freq: Every day | ORAL | 2 refills | Status: DC

## 2022-12-18 MED ORDER — CYANOCOBALAMIN 1000 MCG/ML IJ SOLN
1000.0000 ug | Freq: Once | INTRAMUSCULAR | Status: AC
Start: 2022-12-18 — End: 2022-12-18
  Administered 2022-12-18: 1000 ug via INTRAMUSCULAR

## 2022-12-18 NOTE — Telephone Encounter (Signed)
Spoke with patient, she is aware to do 4 weeks of b12 injection and cholesterol medicine has been send in.

## 2022-12-25 ENCOUNTER — Ambulatory Visit (INDEPENDENT_AMBULATORY_CARE_PROVIDER_SITE_OTHER): Payer: BC Managed Care – PPO

## 2022-12-25 DIAGNOSIS — E538 Deficiency of other specified B group vitamins: Secondary | ICD-10-CM

## 2022-12-25 MED ORDER — CYANOCOBALAMIN 1000 MCG/ML IJ SOLN
1000.0000 ug | Freq: Once | INTRAMUSCULAR | Status: AC
Start: 2022-12-25 — End: 2022-12-25
  Administered 2022-12-25: 1000 ug via INTRAMUSCULAR

## 2023-01-01 ENCOUNTER — Ambulatory Visit (INDEPENDENT_AMBULATORY_CARE_PROVIDER_SITE_OTHER): Payer: BC Managed Care – PPO

## 2023-01-01 DIAGNOSIS — E538 Deficiency of other specified B group vitamins: Secondary | ICD-10-CM

## 2023-01-01 MED ORDER — CYANOCOBALAMIN 1000 MCG/ML IJ SOLN
1000.00 ug | Freq: Once | INTRAMUSCULAR | Status: AC
Start: 2023-01-01 — End: 2023-01-01
  Administered 2023-01-01: 1000 ug via INTRAMUSCULAR

## 2023-01-03 ENCOUNTER — Other Ambulatory Visit: Payer: Self-pay | Admitting: Certified Nurse Midwife

## 2023-01-04 NOTE — Telephone Encounter (Signed)
Would this be an annual appt?

## 2023-01-04 NOTE — Telephone Encounter (Signed)
I contacted the patient via phone. She confirmed date for 7/30 with Pattricia Boss. The patient reports "this prescription isn't working  and she is still suffering". Could you please contact the patient or send something else in?

## 2023-01-08 ENCOUNTER — Ambulatory Visit (INDEPENDENT_AMBULATORY_CARE_PROVIDER_SITE_OTHER): Payer: BC Managed Care – PPO

## 2023-01-08 DIAGNOSIS — E538 Deficiency of other specified B group vitamins: Secondary | ICD-10-CM | POA: Diagnosis not present

## 2023-01-08 MED ORDER — CYANOCOBALAMIN 1000 MCG/ML IJ SOLN
1000.0000 ug | Freq: Once | INTRAMUSCULAR | Status: AC
Start: 2023-01-08 — End: 2023-01-08
  Administered 2023-01-08: 1000 ug via INTRAMUSCULAR

## 2023-01-09 NOTE — Telephone Encounter (Signed)
Pt is seeing AT on 01/22/23

## 2023-01-22 ENCOUNTER — Ambulatory Visit: Payer: BC Managed Care – PPO | Admitting: Certified Nurse Midwife

## 2023-02-17 IMAGING — MG MM DIGITAL SCREENING BILAT W/ TOMO AND CAD
8 series · 8 of 24 positions shown · non-contrast
Comparison: Previous exam(s).

CLINICAL DATA: Screening.

EXAM:
DIGITAL SCREENING BILATERAL MAMMOGRAM WITH TOMOSYNTHESIS AND CAD
TECHNIQUE: Bilateral screening digital craniocaudal and mediolateral oblique
mammograms were obtained. Bilateral screening digital breast
tomosynthesis was performed. The images were evaluated with
computer-aided detection.

[R CC synth-2D]
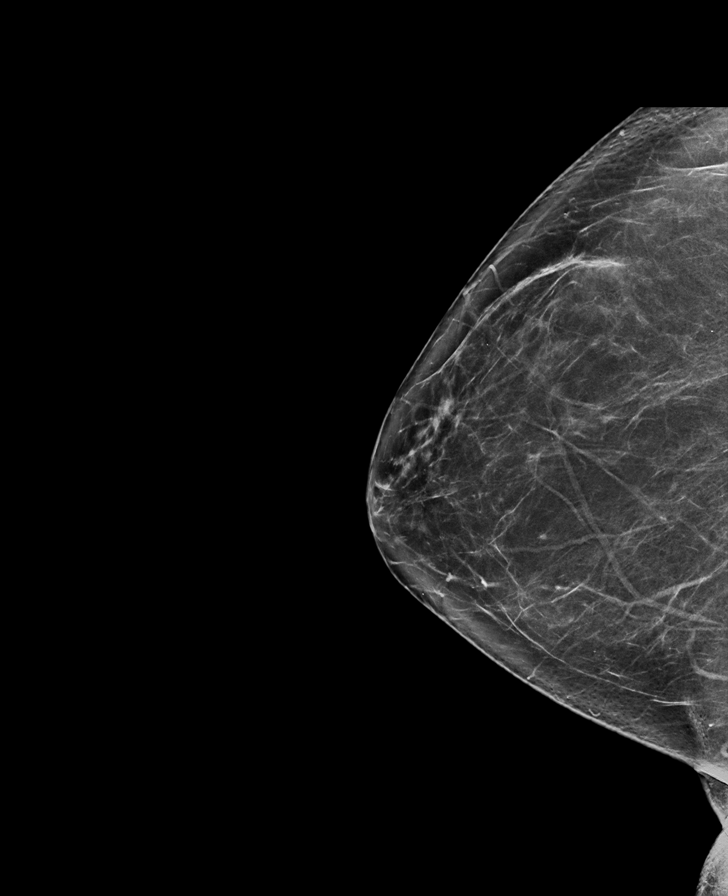

[L CC synth-2D]
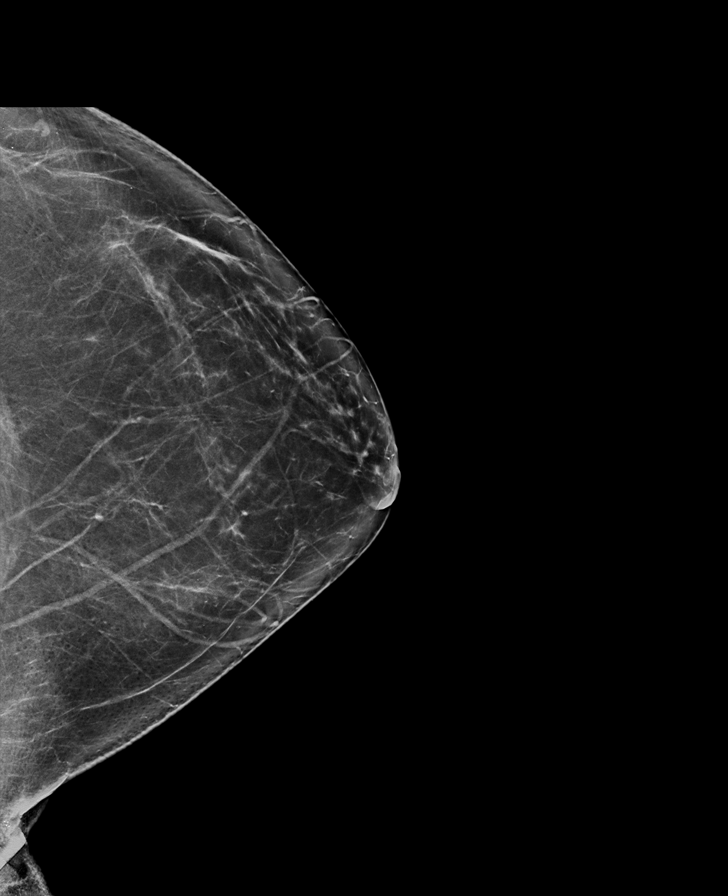

[R MLO synth-2D]
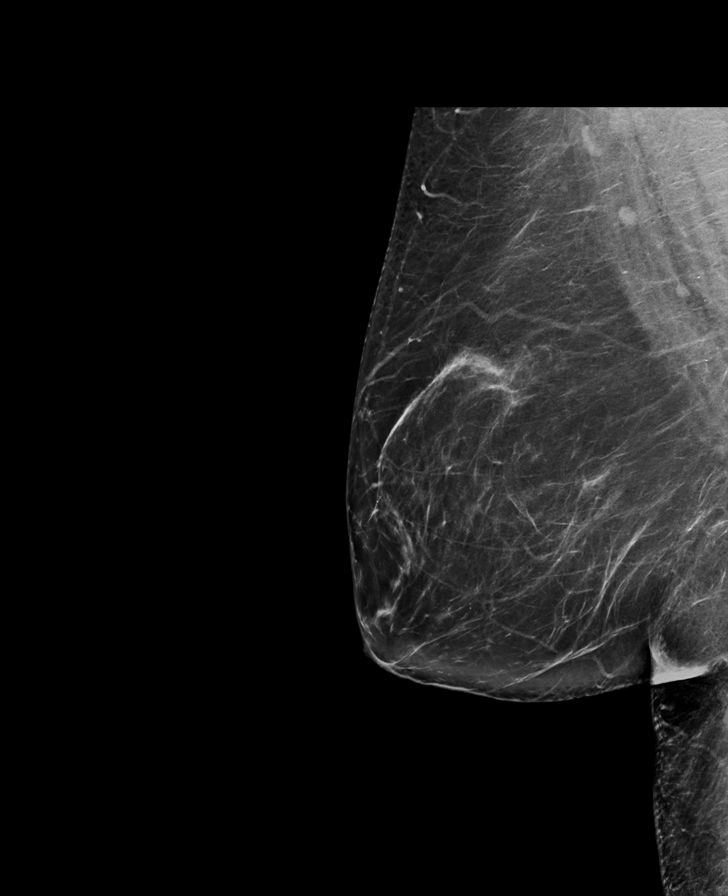

[L MLO synth-2D]
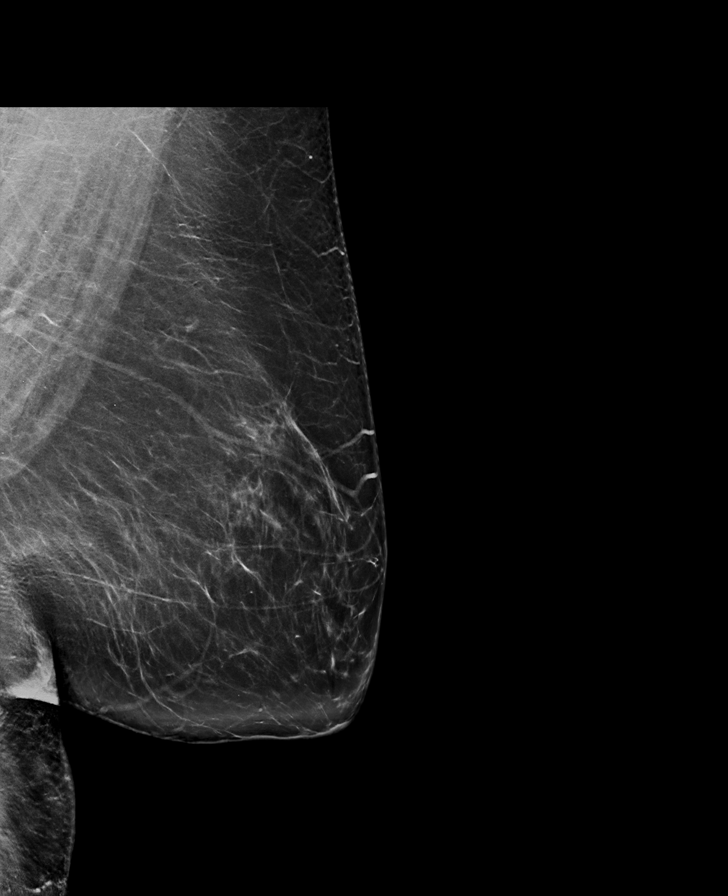

[L MLO tomo · tomo slice 45/88.0]
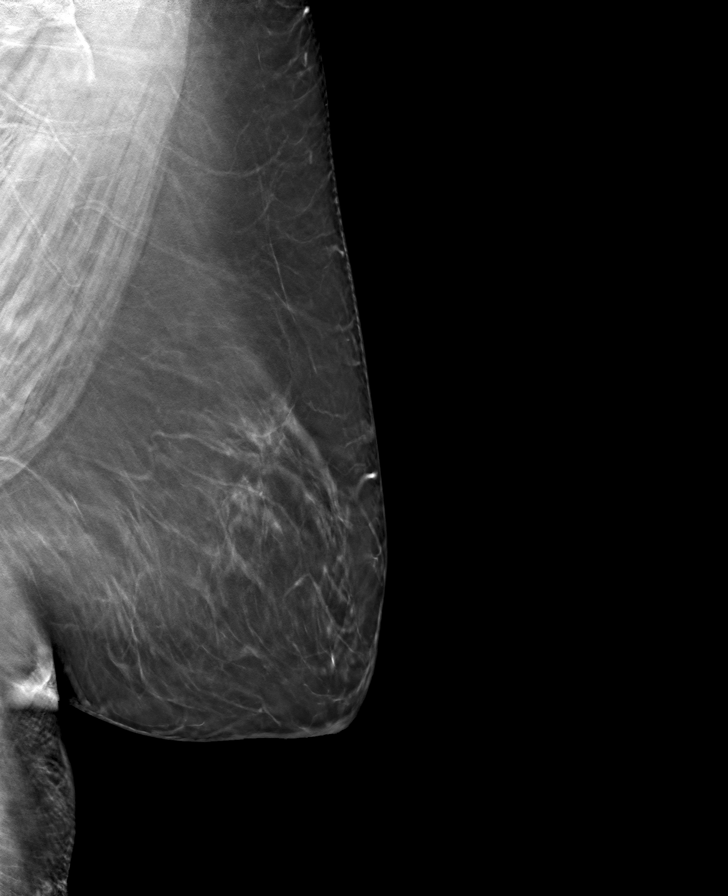

[R MLO tomo · tomo slice 43/86.0]
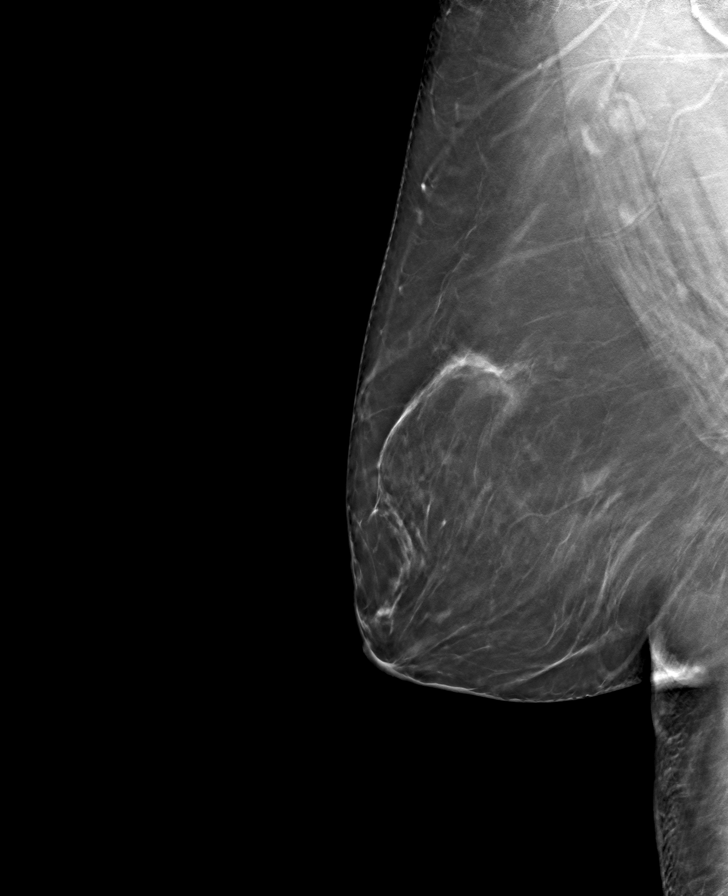

[L CC tomo · tomo slice 40/79.0]
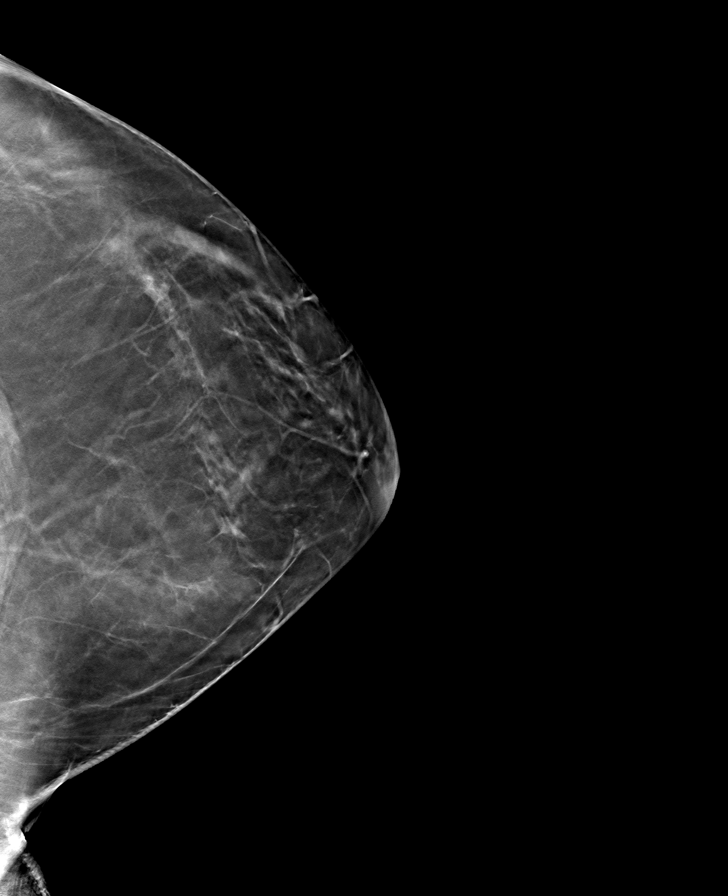

[R CC tomo · tomo slice 38/75.0]
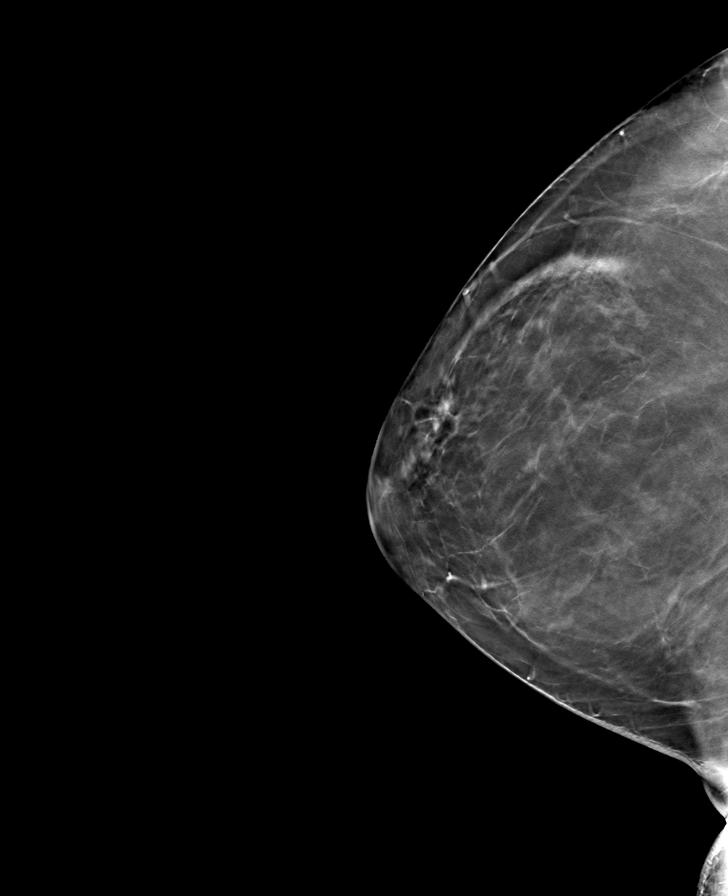

[8 of 24 positions shown; findings below may reference images not displayed]

ACR Breast Density Category b: There are scattered areas of
fibroglandular density.
FINDINGS: There are no findings suspicious for malignancy.
IMPRESSION: No mammographic evidence of malignancy. A result letter of this
screening mammogram will be mailed directly to the patient.

RECOMMENDATION:
Screening mammogram in one year. (Code:51-O-LD2)

BI-RADS CATEGORY  1: Negative.

## 2023-02-19 ENCOUNTER — Ambulatory Visit: Payer: BC Managed Care – PPO | Admitting: Nurse Practitioner

## 2023-02-19 ENCOUNTER — Encounter: Payer: Self-pay | Admitting: Nurse Practitioner

## 2023-02-19 VITALS — BP 139/75 | HR 69 | Temp 98.2°F | Resp 16 | Ht 63.0 in | Wt 220.8 lb

## 2023-02-19 DIAGNOSIS — M1991 Primary osteoarthritis, unspecified site: Secondary | ICD-10-CM

## 2023-02-19 DIAGNOSIS — F411 Generalized anxiety disorder: Secondary | ICD-10-CM

## 2023-02-19 DIAGNOSIS — E1165 Type 2 diabetes mellitus with hyperglycemia: Secondary | ICD-10-CM

## 2023-02-19 DIAGNOSIS — E782 Mixed hyperlipidemia: Secondary | ICD-10-CM | POA: Diagnosis not present

## 2023-02-19 DIAGNOSIS — I10 Essential (primary) hypertension: Secondary | ICD-10-CM | POA: Diagnosis not present

## 2023-02-19 DIAGNOSIS — E538 Deficiency of other specified B group vitamins: Secondary | ICD-10-CM

## 2023-02-19 MED ORDER — ROSUVASTATIN CALCIUM 5 MG PO TABS
5.0000 mg | ORAL_TABLET | Freq: Every day | ORAL | 3 refills | Status: DC
Start: 2023-02-19 — End: 2023-11-22

## 2023-02-19 MED ORDER — SERTRALINE HCL 100 MG PO TABS: 100.0000 mg | ORAL_TABLET | Freq: Every day | ORAL | 3 refills | Status: DC

## 2023-02-19 MED ORDER — TRAMADOL HCL 50 MG PO TABS
ORAL_TABLET | ORAL | 0 refills | Status: DC
Start: 2023-02-19 — End: 2023-05-15

## 2023-02-19 MED ORDER — ALPRAZOLAM 0.5 MG PO TABS
ORAL_TABLET | ORAL | 0 refills | Status: DC
Start: 2023-02-19 — End: 2023-05-15

## 2023-02-19 MED ORDER — BENAZEPRIL HCL 20 MG PO TABS: 20.0000 mg | ORAL_TABLET | Freq: Every day | ORAL | 3 refills | Status: DC

## 2023-02-19 NOTE — Progress Notes (Signed)
[facility] [address] [place], [place] [address]  Internal MEDICINE  Office Visit Note  Patient Name: Kristen Sparks  [id number]  [id number]  Date of Service: 02/19/2023  Chief Complaint  Patient presents with   Depression   Diabetes   Hypertension   Hyperlipidemia   Follow-up     Kristen Sparks presents for a follow-up visit for diabetes, B12 and refills.  Diabetes -- sugars are improving with diet changes. Walking more esp at work.  Low B12 -- taking OTC supplement.  Anxiety --- takes alprazolam as needed, due for refills,  Chronic arthritis -- takes tramadol as needed, due for refills.  Hypertension -- elevated, rechecked and improved.     Current Medication: Outpatient Encounter Medications as of 02/19/2023  Medication Sig   Estradiol 10 MCG TABS vaginal tablet INSERT 1 TABLET(10 MCG) VAGINALLY 2 TIMES A WEEK. START AFTER COMPLETION OF THE DAILY DOSE FOR 14 DAYS   nystatin cream (MYCOSTATIN) Apply 1 application topically 2 (two) times daily.   omeprazole (PRILOSEC) 40 MG capsule Take 1 capsule (40 mg total) by mouth 2 (two) times daily.   STEGLATRO 5 MG TABS tablet TAKE 1 TABLET(5 MG) BY MOUTH DAILY BEFORE BREAKFAST   [DISCONTINUED] ALPRAZolam (XANAX) 0.5 MG tablet Take half to one tab as needed a day ( 3 months supply   [DISCONTINUED] benazepril (LOTENSIN) 20 MG tablet Take 1 tablet (20 mg total) by mouth daily.   [DISCONTINUED] rosuvastatin (CRESTOR) 5 MG tablet Take 1 tablet (5 mg total) by mouth daily.   [DISCONTINUED] sertraline (ZOLOFT) 100 MG tablet Take 1 tablet (100 mg total) by mouth daily.   [DISCONTINUED] traMADol (ULTRAM) 50 MG tablet Take one tab po bid for pain prn only   ALPRAZolam (XANAX) 0.5 MG tablet Take half to one tab as needed a day ( 3 months supply   benazepril (LOTENSIN) 20 MG tablet Take 1 tablet (20 mg total) by mouth daily.   rosuvastatin (CRESTOR) 5 MG tablet Take 1 tablet (5 mg total) by mouth daily.   sertraline (ZOLOFT) 100 MG  tablet Take 1 tablet (100 mg total) by mouth daily.   traMADol (ULTRAM) 50 MG tablet Take one tab po bid for pain prn only   No facility-administered encounter medications on file as of 02/19/2023.    Surgical History: Past Surgical History:  Procedure Laterality Date   CHOLECYSTECTOMY  2000   colon polyectomy     COLONOSCOPY WITH PROPOFOL N/A 03/31/2018   Procedure: COLONOSCOPY WITH PROPOFOL;  Surgeon: Wyline Mood, MD;  Location: [facility] ENDOSCOPY;  Service: Gastroenterology;  Laterality: N/A;   COLONOSCOPY WITH PROPOFOL N/A 10/02/2021   Procedure: COLONOSCOPY WITH PROPOFOL;  Surgeon: Wyline Mood, MD;  Location: [facility] ENDOSCOPY;  Service: Gastroenterology;  Laterality: N/A;   DENTAL SURGERY     FOOT SURGERY      Medical History: Past Medical History:  Diagnosis Date   Anxiety    Asthma    Depression    Environmental allergies    Hyperlipidemia    Hypertension    Rheumatoid arthritis (HCC)    Sleep apnea 2019   Borderline   Type 2 diabetes mellitus (HCC)     Family History: Family History  Problem Relation Age of Onset   Diabetes Mother    COPD Mother    Uterine cancer Mother 16   Cancer Sister 59       Bile Duct   Stroke Sister    Breast cancer Neg Hx     Social  History   Socioeconomic History   Marital status: Married    Spouse name: Not on file   Number of children: Not on file   Years of education: Not on file   Highest education level: Not on file  Occupational History   Not on file  Tobacco Use   Smoking status: Never   Smokeless tobacco: Never  Vaping Use   Vaping status: Never Used  Substance and Sexual Activity   Alcohol use: Yes    Comment: occ.   Drug use: No   Sexual activity: Yes    Birth control/protection: Post-menopausal  Other Topics Concern   Not on file  Social History Narrative   Not on file   Social Determinants of Health   Financial Resource Strain: Not on file  Food Insecurity: Not on file  Transportation Needs: Not on file   Physical Activity: Not on file  Stress: Not on file  Social Connections: Not on file  Intimate Partner Violence: Not on file      Review of Systems  Constitutional: Negative.  Negative for chills, fatigue and unexpected weight change.  HENT:  Negative for congestion, rhinorrhea, sneezing and sore throat.   Eyes:  Negative for redness.  Respiratory: Negative.  Negative for cough, chest tightness, shortness of breath and wheezing.   Cardiovascular: Negative.  Negative for chest pain and palpitations.  Gastrointestinal: Negative.  Negative for abdominal pain, constipation, diarrhea, nausea and vomiting.  Genitourinary:  Negative for dysuria and frequency.  Musculoskeletal:  Positive for arthralgias. Negative for back pain, joint swelling and neck pain.  Skin: Negative.  Negative for rash.  Neurological: Negative.  Negative for tremors and numbness.  Hematological:  Negative for adenopathy. Does not bruise/bleed easily.  Psychiatric/Behavioral:  Positive for behavioral problems (Depression), depression and sleep disturbance. Negative for self-injury and suicidal ideas. The patient is nervous/anxious.     Vital Signs: BP 139/75 Comment: 140/98  Pulse 69   Temp 98.2 F (36.8 C)   Resp 16   Ht 5\' 3"  (1.6 m)   Wt 220 lb 12.8 oz (100.2 kg)   SpO2 97%   BMI 39.11 kg/m    Physical Exam Vitals reviewed.  Constitutional:      General: She is not in acute distress.    Appearance: Normal appearance. She is obese. She is not ill-appearing.  HENT:     Head: Normocephalic and atraumatic.  Eyes:     Pupils: Pupils are equal, round, and reactive to light.  Cardiovascular:     Rate and Rhythm: Normal rate and regular rhythm.  Pulmonary:     Effort: Pulmonary effort is normal. No respiratory distress.  Neurological:     Mental Status: She is alert and oriented to person, place, and time.  Psychiatric:        Mood and Affect: Mood normal.        Behavior: Behavior normal.         Assessment/Plan: 1. Type 2 diabetes mellitus with hyperglycemia, without long-term current use of insulin (HCC) Continue steglatro as prescribed and continue diet modifications and exercise as discussed. Continue checking glucose level at least once daily.   2. Essential (primary) hypertension Continue benazepril as prescribed, continue to monitor BP at office visits.  - benazepril (LOTENSIN) 20 MG tablet; Take 1 tablet (20 mg total) by mouth daily.  Dispense: 90 tablet; Refill: 3  3. B12 deficiency Continue OTC supplement, repeat labs in or around December.   4. Mixed hyperlipidemia Continue rosuvastatin as  prescribed.  - rosuvastatin (CRESTOR) 5 MG tablet; Take 1 tablet (5 mg total) by mouth daily.  Dispense: 90 tablet; Refill: 3  5. Primary localized osteoarthrosis Continue prn tramadol as prescribed.  - traMADol (ULTRAM) 50 MG tablet; Take one tab po bid for pain prn only  Dispense: 180 tablet; Refill: 0  6. Generalized anxiety disorder Continue alprazolam and sertraline as prescribed.  - ALPRAZolam (XANAX) 0.5 MG tablet; Take half to one tab as needed a day ( 3 months supply  Dispense: 90 tablet; Refill: 0 - sertraline (ZOLOFT) 100 MG tablet; Take 1 tablet (100 mg total) by mouth daily.  Dispense: 90 tablet; Refill: 3   General Counseling: Kristen Sparks verbalizes understanding of the findings of todays visit and agrees with plan of treatment. I have discussed any further diagnostic evaluation that may be needed or ordered today. We also reviewed her medications today. she has been encouraged to call the office with any questions or concerns that should arise related to todays visit.    No orders of the defined types were placed in this encounter.   Meds ordered this encounter  Medications   ALPRAZolam (XANAX) 0.5 MG tablet    Sig: Take half to one tab as needed a day ( 3 months supply    Dispense:  90 tablet    Refill:  0    90 day supply.   benazepril (LOTENSIN) 20  MG tablet    Sig: Take 1 tablet (20 mg total) by mouth daily.    Dispense:  90 tablet    Refill:  3   rosuvastatin (CRESTOR) 5 MG tablet    Sig: Take 1 tablet (5 mg total) by mouth daily.    Dispense:  90 tablet    Refill:  3   sertraline (ZOLOFT) 100 MG tablet    Sig: Take 1 tablet (100 mg total) by mouth daily.    Dispense:  90 tablet    Refill:  3   traMADol (ULTRAM) 50 MG tablet    Sig: Take one tab po bid for pain prn only    Dispense:  180 tablet    Refill:  0    90 day supply please.    Return in about 3 months (around 05/15/2023) for F/U, anxiety med refill, pain med refill, Charles Niese PCP.   Total time spent:30 Minutes Time spent includes review of chart, medications, test results, and follow up plan with the patient.   [place] Controlled Substance Database was reviewed by me.  This patient was seen by Sallyanne Kuster, FNP-C in collaboration with Dr. Beverely Risen as a part of collaborative care agreement.   Yahmir Sokolov R. Tedd Sias, MSN, FNP-C Internal medicine

## 2023-03-27 ENCOUNTER — Other Ambulatory Visit: Payer: Self-pay | Admitting: Nurse Practitioner

## 2023-03-27 ENCOUNTER — Telehealth: Payer: Self-pay

## 2023-03-28 ENCOUNTER — Other Ambulatory Visit: Payer: Self-pay

## 2023-03-28 ENCOUNTER — Encounter: Payer: Self-pay | Admitting: Nurse Practitioner

## 2023-03-29 MED ORDER — EMPAGLIFLOZIN 25 MG PO TABS: 25.0000 mg | ORAL_TABLET | Freq: Every day | ORAL | 5 refills | Status: DC

## 2023-03-29 NOTE — Telephone Encounter (Signed)
Pt advised that  we jardiance

## 2023-04-04 ENCOUNTER — Other Ambulatory Visit: Payer: Self-pay

## 2023-04-04 ENCOUNTER — Encounter: Payer: Self-pay | Admitting: Nurse Practitioner

## 2023-04-04 DIAGNOSIS — E1165 Type 2 diabetes mellitus with hyperglycemia: Secondary | ICD-10-CM

## 2023-04-12 DIAGNOSIS — B3731 Acute candidiasis of vulva and vagina: Secondary | ICD-10-CM | POA: Diagnosis not present

## 2023-04-12 DIAGNOSIS — N952 Postmenopausal atrophic vaginitis: Secondary | ICD-10-CM | POA: Diagnosis not present

## 2023-04-22 NOTE — Telephone Encounter (Signed)
Lmom that we don't have any other meter pres please have her phar send [place]

## 2023-04-30 ENCOUNTER — Telehealth: Payer: Self-pay | Admitting: Nurse Practitioner

## 2023-04-30 DIAGNOSIS — E119 Type 2 diabetes mellitus without complications: Secondary | ICD-10-CM | POA: Diagnosis not present

## 2023-04-30 DIAGNOSIS — R0602 Shortness of breath: Secondary | ICD-10-CM | POA: Diagnosis not present

## 2023-04-30 DIAGNOSIS — R059 Cough, unspecified: Secondary | ICD-10-CM | POA: Diagnosis not present

## 2023-04-30 DIAGNOSIS — F419 Anxiety disorder, unspecified: Secondary | ICD-10-CM | POA: Diagnosis not present

## 2023-04-30 DIAGNOSIS — R0600 Dyspnea, unspecified: Secondary | ICD-10-CM | POA: Diagnosis not present

## 2023-04-30 DIAGNOSIS — J069 Acute upper respiratory infection, unspecified: Secondary | ICD-10-CM | POA: Diagnosis not present

## 2023-04-30 DIAGNOSIS — I10 Essential (primary) hypertension: Secondary | ICD-10-CM | POA: Diagnosis not present

## 2023-04-30 DIAGNOSIS — B9789 Other viral agents as the cause of diseases classified elsewhere: Secondary | ICD-10-CM | POA: Diagnosis not present

## 2023-04-30 DIAGNOSIS — Z79899 Other long term (current) drug therapy: Secondary | ICD-10-CM | POA: Diagnosis not present

## 2023-04-30 NOTE — Telephone Encounter (Signed)
Lvm to schedule virtual appointment-Toni

## 2023-05-02 ENCOUNTER — Encounter: Payer: Self-pay | Admitting: Nurse Practitioner

## 2023-05-02 ENCOUNTER — Ambulatory Visit (INDEPENDENT_AMBULATORY_CARE_PROVIDER_SITE_OTHER): Payer: BC Managed Care – PPO | Admitting: Nurse Practitioner

## 2023-05-02 VITALS — BP 150/90 | HR 80 | Temp 98.6°F | Resp 16 | Ht 63.0 in | Wt 216.0 lb

## 2023-05-02 DIAGNOSIS — J22 Unspecified acute lower respiratory infection: Secondary | ICD-10-CM

## 2023-05-02 DIAGNOSIS — I83811 Varicose veins of right lower extremities with pain: Secondary | ICD-10-CM

## 2023-05-02 DIAGNOSIS — L570 Actinic keratosis: Secondary | ICD-10-CM | POA: Diagnosis not present

## 2023-05-02 MED ORDER — PREDNISONE 10 MG (21) PO TBPK: ORAL_TABLET | ORAL | 0 refills | Status: DC

## 2023-05-02 MED ORDER — HYDROCOD POLI-CHLORPHE POLI ER 10-8 MG/5ML PO SUER: 5.0000 mL | Freq: Two times a day (BID) | ORAL | 0 refills | Status: DC | PRN

## 2023-05-02 MED ORDER — LEVOFLOXACIN 750 MG PO TABS: 750.0000 mg | ORAL_TABLET | Freq: Every day | ORAL | 0 refills | Status: DC

## 2023-05-02 NOTE — Progress Notes (Signed)
[facility] [address] [place], [place] [address]  Internal MEDICINE  Office Visit Note  Patient Name: Kristen Sparks  [id number]  [id number]  Date of Service: 05/02/2023  Chief Complaint  Patient presents with   Depression   Diabetes   Hypertension   Hyperlipidemia   Follow-up    [facility] ED- Acute upper respiratory infection.      HPI Kristen Sparks presents for an acute sick visit for acute URI --onset of symptoms was last Friday --audible wheezes noted today --reports worsening wheezing and chest congestion in the evening and at night. Fever, chills, sweats  Recently went to [facility] ED for acute URI.       Current Medication:  Outpatient Encounter Medications as of 05/02/2023  Medication Sig   benazepril (LOTENSIN) 20 MG tablet Take 1 tablet (20 mg total) by mouth daily.   empagliflozin (JARDIANCE) 25 MG TABS tablet Take 1 tablet (25 mg total) by mouth daily before breakfast.   Estradiol 10 MCG TABS vaginal tablet INSERT 1 TABLET(10 MCG) VAGINALLY 2 TIMES A WEEK. START AFTER COMPLETION OF THE DAILY DOSE FOR 14 DAYS   nystatin cream (MYCOSTATIN) Apply 1 application topically 2 (two) times daily.   omeprazole (PRILOSEC) 40 MG capsule Take 1 capsule (40 mg total) by mouth 2 (two) times daily.   predniSONE (STERAPRED UNI-PAK 21 TAB) 10 MG (21) TBPK tablet Use as directed for 6 days   rosuvastatin (CRESTOR) 5 MG tablet Take 1 tablet (5 mg total) by mouth daily.   sertraline (ZOLOFT) 100 MG tablet Take 1 tablet (100 mg total) by mouth daily.   [DISCONTINUED] ALPRAZolam (XANAX) 0.5 MG tablet Take half to one tab as needed a day ( 3 months supply   [DISCONTINUED] chlorpheniramine-HYDROcodone (TUSSIONEX) 10-8 MG/5ML Take 5 mLs by mouth every 12 (twelve) hours as needed for cough.   [DISCONTINUED] fluconazole (DIFLUCAN) 150 MG tablet TAKE 1 TABLET(150 MG) BY MOUTH 1 TIME FOR 1 DOSE. MAY REPEAT IN 3 DAYS IF SYMPTOMS PERSIST   [DISCONTINUED] levofloxacin (LEVAQUIN) 750 MG tablet Take  1 tablet (750 mg total) by mouth daily.   [DISCONTINUED] traMADol (ULTRAM) 50 MG tablet Take one tab po bid for pain prn only   chlorpheniramine-HYDROcodone (TUSSIONEX) 10-8 MG/5ML Take 5 mLs by mouth every 12 (twelve) hours as needed for cough.   No facility-administered encounter medications on file as of 05/02/2023.      Medical History: Past Medical History:  Diagnosis Date   Anxiety    Asthma    Depression    Environmental allergies    Hyperlipidemia    Hypertension    Rheumatoid arthritis (HCC)    Sleep apnea 2019   Borderline   Type 2 diabetes mellitus (HCC)      Vital Signs: BP (!) 150/90   Pulse 80   Temp 98.6 F (37 C)   Resp 16   Ht 5\' 3"  (1.6 m)   Wt 216 lb (98 kg)   SpO2 98%   BMI 38.26 kg/m    Review of Systems  Constitutional:  Positive for chills, diaphoresis, fatigue and fever.  HENT:  Positive for congestion, ear pain, postnasal drip, rhinorrhea, sinus pressure, sinus pain, sneezing, sore throat, trouble swallowing and voice change.   Eyes:  Positive for discharge and visual disturbance.  Respiratory:  Positive for cough, chest tightness, shortness of breath and wheezing.   Cardiovascular: Negative.  Negative for chest pain and palpitations.  Gastrointestinal:  Negative for diarrhea, nausea and vomiting.  Musculoskeletal:  Positive for myalgias (body aches).  Neurological:  Positive for headaches.  Psychiatric/Behavioral:  Positive for depression.     Physical Exam Vitals reviewed.  Constitutional:      Appearance: Normal appearance. She is obese. She is ill-appearing.  HENT:     Head: Normocephalic and atraumatic.  Eyes:     Pupils: Pupils are equal, round, and reactive to light.  Cardiovascular:     Rate and Rhythm: Normal rate and regular rhythm.     Heart sounds: Normal heart sounds. No murmur heard.    No friction rub.  Pulmonary:     Effort: Pulmonary effort is normal. No accessory muscle usage or respiratory distress.     Breath  sounds: Normal air entry. Examination of the right-upper field reveals wheezing. Examination of the left-upper field reveals wheezing. Examination of the right-middle field reveals wheezing. Examination of the left-middle field reveals wheezing. Wheezing present.  Neurological:     Mental Status: She is alert and oriented to person, place, and time.  Psychiatric:        Mood and Affect: Mood normal.        Behavior: Behavior normal.       Assessment/Plan: 1. Lower respiratory infection (e.g., bronchitis, pneumonia, pneumonitis, pulmonitis) Levofloxacin and prednisone taper prescribed, take until gone. Tussionex prescribed for relief of cough.  - levofloxacin (LEVAQUIN) 750 MG tablet; Take 1 tablet (750 mg total) by mouth daily.  Dispense: 7 tablet; Refill: 0 - chlorpheniramine-HYDROcodone (TUSSIONEX) 10-8 MG/5ML; Take 5 mLs by mouth every 12 (twelve) hours as needed for cough.  Dispense: 140 mL; Refill: 0 - predniSONE (STERAPRED UNI-PAK 21 TAB) 10 MG (21) TBPK tablet; Use as directed for 6 days  Dispense: 21 tablet; Refill: 0  2. Actinic keratosis Referred to dermatology - Ambulatory referral to Dermatology  3. Varicose veins of right lower extremity with pain Consider referral to vascular surgery   General Counseling: Kristen Sparks verbalizes understanding of the findings of todays visit and agrees with plan of treatment. I have discussed any further diagnostic evaluation that may be needed or ordered today. We also reviewed her medications today. she has been encouraged to call the office with any questions or concerns that should arise related to todays visit.    Counseling:    Orders Placed This Encounter  Procedures   Ambulatory referral to Dermatology    Meds ordered this encounter  Medications   DISCONTD: levofloxacin (LEVAQUIN) 750 MG tablet    Sig: Take 1 tablet (750 mg total) by mouth daily.    Dispense:  7 tablet    Refill:  0    Fill script asap today   DISCONTD:  chlorpheniramine-HYDROcodone (TUSSIONEX) 10-8 MG/5ML    Sig: Take 5 mLs by mouth every 12 (twelve) hours as needed for cough.    Dispense:  140 mL    Refill:  0    Fill script asap today   predniSONE (STERAPRED UNI-PAK 21 TAB) 10 MG (21) TBPK tablet    Sig: Use as directed for 6 days    Dispense:  21 tablet    Refill:  0   chlorpheniramine-HYDROcodone (TUSSIONEX) 10-8 MG/5ML    Sig: Take 5 mLs by mouth every 12 (twelve) hours as needed for cough.    Dispense:  140 mL    Refill:  0    Fill script asap today    Return if symptoms worsen or fail to improve.  [place] Controlled Substance Database was reviewed by me for overdose risk score (ORS)  Time spent:30 Minutes Time spent with patient included reviewing progress notes, labs, imaging studies, and discussing plan for follow up.   This patient was seen by Sallyanne Kuster, FNP-C in collaboration with Dr. Beverely Risen as a part of collaborative care agreement.  Karell Tukes R. Tedd Sias, MSN, FNP-C Internal Medicine

## 2023-05-04 MED ORDER — CONTOUR TEST VI STRP: ORAL_STRIP | 12 refills | Status: DC

## 2023-05-04 MED ORDER — MICROLET LANCETS MISC: 11 refills | Status: DC

## 2023-05-04 MED ORDER — HYDROCOD POLI-CHLORPHE POLI ER 10-8 MG/5ML PO SUER: 5.0000 mL | Freq: Two times a day (BID) | ORAL | 0 refills | Status: DC | PRN

## 2023-05-06 ENCOUNTER — Ambulatory Visit: Payer: BC Managed Care – PPO | Admitting: Nurse Practitioner

## 2023-05-10 ENCOUNTER — Encounter: Payer: Self-pay | Admitting: Nurse Practitioner

## 2023-05-15 ENCOUNTER — Ambulatory Visit: Payer: BC Managed Care – PPO | Admitting: Nurse Practitioner

## 2023-05-15 ENCOUNTER — Encounter: Payer: Self-pay | Admitting: Nurse Practitioner

## 2023-05-15 VITALS — BP 132/84 | HR 79 | Temp 98.3°F | Resp 16 | Ht 63.0 in | Wt 214.6 lb

## 2023-05-15 DIAGNOSIS — F411 Generalized anxiety disorder: Secondary | ICD-10-CM

## 2023-05-15 DIAGNOSIS — E1169 Type 2 diabetes mellitus with other specified complication: Secondary | ICD-10-CM | POA: Diagnosis not present

## 2023-05-15 DIAGNOSIS — E1165 Type 2 diabetes mellitus with hyperglycemia: Secondary | ICD-10-CM

## 2023-05-15 DIAGNOSIS — E785 Hyperlipidemia, unspecified: Secondary | ICD-10-CM | POA: Diagnosis not present

## 2023-05-15 DIAGNOSIS — M1991 Primary osteoarthritis, unspecified site: Secondary | ICD-10-CM | POA: Diagnosis not present

## 2023-05-15 LAB — POCT GLYCOSYLATED HEMOGLOBIN (HGB A1C): Hemoglobin A1C: 7.7 % — AB (ref 4.0–5.6)

## 2023-05-15 MED ORDER — TRAMADOL HCL 50 MG PO TABS: ORAL_TABLET | ORAL | 0 refills | Status: DC

## 2023-05-15 MED ORDER — ALPRAZOLAM 0.5 MG PO TABS: ORAL_TABLET | ORAL | 0 refills | Status: DC

## 2023-05-15 MED ORDER — TIRZEPATIDE 5 MG/0.5ML ~~LOC~~ SOAJ: 5.0000 mg | SUBCUTANEOUS | 5 refills | Status: DC

## 2023-05-15 NOTE — Progress Notes (Signed)
[facility] [address] [place], [place] [address]  Internal MEDICINE  Office Visit Note  Patient Name: Kristen Sparks  [id number]  [id number]  Date of Service: 05/15/2023  Chief Complaint  Patient presents with   Follow-up   Diabetes    HPI Leanor presents for a follow-up visit for diabetes, recent pneumonia, anxiety, osteoarthritis.  Diabetes -- A1c is 7.7, up from 7.5 in may. Ran out of jardiance, did not call pharmacy to refill because pharmacy did not call her. Has problems with sweets, pasta and sweet tea. Difficulty with changing diet due to sugar cravings.  Recent pneumonia -- much better but still recovering.  Anxiety -- takes prn alprazolam, due for refills.  Osteoarthritis -- takes prn tramadol, due for refills.     Current Medication: Outpatient Encounter Medications as of 05/15/2023  Medication Sig   tirzepatide (MOUNJARO) 5 MG/0.5ML Pen Inject 5 mg into the skin once a week.   ALPRAZolam (XANAX) 0.5 MG tablet Take half to one tab as needed a day ( 3 months supply   benazepril (LOTENSIN) 20 MG tablet Take 1 tablet (20 mg total) by mouth daily.   chlorpheniramine-HYDROcodone (TUSSIONEX) 10-8 MG/5ML Take 5 mLs by mouth every 12 (twelve) hours as needed for cough.   empagliflozin (JARDIANCE) 25 MG TABS tablet Take 1 tablet (25 mg total) by mouth daily before breakfast.   Estradiol 10 MCG TABS vaginal tablet INSERT 1 TABLET(10 MCG) VAGINALLY 2 TIMES A WEEK. START AFTER COMPLETION OF THE DAILY DOSE FOR 14 DAYS   fluconazole (DIFLUCAN) 150 MG tablet TAKE 1 TABLET(150 MG) BY MOUTH 1 TIME FOR 1 DOSE. MAY REPEAT IN 3 DAYS IF SYMPTOMS PERSIST   glucose blood (CONTOUR TEST) test strip Use 1 test strip once daily to check blood sugar for diabetes E11.65   Microlet Lancets MISC Use 1 lancet once daily to check blood sugar for diabetes E11.65   nystatin cream (MYCOSTATIN) Apply 1 application topically 2 (two) times daily.   omeprazole (PRILOSEC) 40 MG capsule Take 1  capsule (40 mg total) by mouth 2 (two) times daily.   predniSONE (STERAPRED UNI-PAK 21 TAB) 10 MG (21) TBPK tablet Use as directed for 6 days   rosuvastatin (CRESTOR) 5 MG tablet Take 1 tablet (5 mg total) by mouth daily.   sertraline (ZOLOFT) 100 MG tablet Take 1 tablet (100 mg total) by mouth daily.   traMADol (ULTRAM) 50 MG tablet Take one tab po bid for pain prn only   [DISCONTINUED] ALPRAZolam (XANAX) 0.5 MG tablet Take half to one tab as needed a day ( 3 months supply   [DISCONTINUED] levofloxacin (LEVAQUIN) 750 MG tablet Take 1 tablet (750 mg total) by mouth daily.   [DISCONTINUED] traMADol (ULTRAM) 50 MG tablet Take one tab po bid for pain prn only   No facility-administered encounter medications on file as of 05/15/2023.    Surgical History: Past Surgical History:  Procedure Laterality Date   CHOLECYSTECTOMY  2000   colon polyectomy     COLONOSCOPY WITH PROPOFOL N/A 03/31/2018   Procedure: COLONOSCOPY WITH PROPOFOL;  Surgeon: Wyline Mood, MD;  Location: [facility] ENDOSCOPY;  Service: Gastroenterology;  Laterality: N/A;   COLONOSCOPY WITH PROPOFOL N/A 10/02/2021   Procedure: COLONOSCOPY WITH PROPOFOL;  Surgeon: Wyline Mood, MD;  Location: [facility] ENDOSCOPY;  Service: Gastroenterology;  Laterality: N/A;   DENTAL SURGERY     FOOT SURGERY      Medical History: Past Medical History:  Diagnosis Date   Anxiety  Asthma    Depression    Environmental allergies    Hyperlipidemia    Hypertension    Rheumatoid arthritis (HCC)    Sleep apnea 2019   Borderline   Type 2 diabetes mellitus (HCC)     Family History: Family History  Problem Relation Age of Onset   Diabetes Mother    COPD Mother    Uterine cancer Mother 41   Cancer Sister 51       Bile Duct   Stroke Sister    Breast cancer Neg Hx     Social History   Socioeconomic History   Marital status: Married    Spouse name: Not on file   Number of children: Not on file   Years of education: Not on file   Highest  education level: Not on file  Occupational History   Not on file  Tobacco Use   Smoking status: Never   Smokeless tobacco: Never  Vaping Use   Vaping status: Never Used  Substance and Sexual Activity   Alcohol use: Yes    Comment: occ.   Drug use: No   Sexual activity: Yes    Birth control/protection: Post-menopausal  Other Topics Concern   Not on file  Social History Narrative   Not on file   Social Determinants of Health   Financial Resource Strain: Not on file  Food Insecurity: Not on file  Transportation Needs: Not on file  Physical Activity: Not on file  Stress: Not on file  Social Connections: Not on file  Intimate Partner Violence: Not on file      Review of Systems  Constitutional: Negative.  Negative for chills, fatigue and unexpected weight change.  HENT:  Negative for congestion, rhinorrhea, sneezing and sore throat.   Eyes:  Negative for redness.  Respiratory: Negative.  Negative for cough, chest tightness, shortness of breath and wheezing.   Cardiovascular: Negative.  Negative for chest pain and palpitations.  Gastrointestinal: Negative.  Negative for abdominal pain, constipation, diarrhea, nausea and vomiting.  Genitourinary:  Negative for dysuria and frequency.  Musculoskeletal:  Positive for arthralgias. Negative for back pain, joint swelling and neck pain.  Skin: Negative.  Negative for rash.  Neurological: Negative.  Negative for tremors and numbness.  Hematological:  Negative for adenopathy. Does not bruise/bleed easily.  Psychiatric/Behavioral:  Positive for behavioral problems (Depression) and sleep disturbance. Negative for self-injury and suicidal ideas. The patient is nervous/anxious.     Vital Signs: BP 132/84   Pulse 79   Temp 98.3 F (36.8 C)   Resp 16   Ht 5\' 3"  (1.6 m)   Wt 214 lb 9.6 oz (97.3 kg)   SpO2 98%   BMI 38.01 kg/m    Physical Exam Vitals reviewed.  Constitutional:      General: She is not in acute distress.     Appearance: Normal appearance. She is obese. She is not ill-appearing.  HENT:     Head: Normocephalic and atraumatic.  Eyes:     Pupils: Pupils are equal, round, and reactive to light.  Cardiovascular:     Rate and Rhythm: Normal rate and regular rhythm.  Pulmonary:     Effort: Pulmonary effort is normal. No respiratory distress.  Neurological:     Mental Status: She is alert and oriented to person, place, and time.  Psychiatric:        Mood and Affect: Mood normal.        Behavior: Behavior normal.  Assessment/Plan: 1. Type 2 diabetes mellitus with other specified complication, without long-term current use of insulin (HCC) A1c increased to 7.7. ran out of jardiance. But even this has not bee enough glucose levels. Also needs help with sugar cravings and weight loss. Mounjaro sample provided and 5 mg dose of mounjaro prescribed.  - POCT HgB A1C - tirzepatide (MOUNJARO) 5 MG/0.5ML Pen; Inject 5 mg into the skin once a week.  Dispense: 2 mL; Refill: 5  2. Hyperlipidemia associated with type 2 diabetes mellitus (HCC) Continue rosuvastatin as prescribed.   3. Primary localized osteoarthrosis Continue prn tramadol as prescribed, follow up in 3 months for additional refills . - traMADol (ULTRAM) 50 MG tablet; Take one tab po bid for pain prn only  Dispense: 180 tablet; Refill: 0  4. Generalized anxiety disorder Continue prn alprazolam as prescribed. Follow up in 3 months for additional refills.  - ALPRAZolam (XANAX) 0.5 MG tablet; Take half to one tab as needed a day ( 3 months supply  Dispense: 90 tablet; Refill: 0   General Counseling: Mairyn verbalizes understanding of the findings of todays visit and agrees with plan of treatment. I have discussed any further diagnostic evaluation that may be needed or ordered today. We also reviewed her medications today. she has been encouraged to call the office with any questions or concerns that should arise related to todays  visit.    Orders Placed This Encounter  Procedures   POCT HgB A1C    Meds ordered this encounter  Medications   tirzepatide (MOUNJARO) 5 MG/0.5ML Pen    Sig: Inject 5 mg into the skin once a week.    Dispense:  2 mL    Refill:  5    Dx code E11.65, please send prior auth asap if required.   ALPRAZolam (XANAX) 0.5 MG tablet    Sig: Take half to one tab as needed a day ( 3 months supply    Dispense:  90 tablet    Refill:  0    90 day supply.   traMADol (ULTRAM) 50 MG tablet    Sig: Take one tab po bid for pain prn only    Dispense:  180 tablet    Refill:  0    90 day supply please.    Return in about 1 month (around 06/14/2023) for F/U, Syeda Prickett PCP, eval new med.   Total time spent:30 Minutes Time spent includes review of chart, medications, test results, and follow up plan with the patient.   [place] Controlled Substance Database was reviewed by me.  This patient was seen by Sallyanne Kuster, FNP-C in collaboration with Dr. Beverely Risen as a part of collaborative care agreement.   Saanvika Vazques R. Tedd Sias, MSN, FNP-C Internal medicine

## 2023-05-30 ENCOUNTER — Other Ambulatory Visit: Payer: Self-pay | Admitting: Nurse Practitioner

## 2023-05-30 ENCOUNTER — Encounter: Payer: Self-pay | Admitting: Certified Nurse Midwife

## 2023-05-30 MED ORDER — FLUCONAZOLE 150 MG PO TABS: 150.0000 mg | ORAL_TABLET | Freq: Once | ORAL | 0 refills | Status: AC

## 2023-06-03 ENCOUNTER — Encounter: Payer: Self-pay | Admitting: Nurse Practitioner

## 2023-06-14 DIAGNOSIS — B3731 Acute candidiasis of vulva and vagina: Secondary | ICD-10-CM | POA: Diagnosis not present

## 2023-06-14 DIAGNOSIS — N952 Postmenopausal atrophic vaginitis: Secondary | ICD-10-CM | POA: Diagnosis not present

## 2023-06-25 ENCOUNTER — Encounter: Payer: Self-pay | Admitting: Nurse Practitioner

## 2023-06-25 ENCOUNTER — Ambulatory Visit: Payer: BC Managed Care – PPO | Admitting: Nurse Practitioner

## 2023-06-25 VITALS — BP 136/88 | HR 71 | Temp 98.3°F | Resp 16 | Ht 63.0 in | Wt 207.4 lb

## 2023-06-25 DIAGNOSIS — E1159 Type 2 diabetes mellitus with other circulatory complications: Secondary | ICD-10-CM | POA: Diagnosis not present

## 2023-06-25 DIAGNOSIS — F411 Generalized anxiety disorder: Secondary | ICD-10-CM

## 2023-06-25 DIAGNOSIS — E785 Hyperlipidemia, unspecified: Secondary | ICD-10-CM

## 2023-06-25 DIAGNOSIS — E1169 Type 2 diabetes mellitus with other specified complication: Secondary | ICD-10-CM | POA: Diagnosis not present

## 2023-06-25 DIAGNOSIS — I152 Hypertension secondary to endocrine disorders: Secondary | ICD-10-CM | POA: Diagnosis not present

## 2023-06-25 NOTE — Progress Notes (Signed)
 [facility] [address] [place], [place] [address]  Internal MEDICINE  Office Visit Note  Patient Name: Kristen Sparks  [id number]  [id number]  Date of Service: 06/25/2023  Chief Complaint  Patient presents with   Depression   Diabetes   Hypertension   Hyperlipidemia    HPI Blannie presents for a follow-up visit for diabetes, anxiety, eye appt, hypertension and high cholesterol  Diabetes -- started mounjaro , was doing well without side effects on the lower dose but has has some side effects on the higher dose including constipation, blurred vision, and headache, nausea and increased symptoms of GERD. SABRA She took the first injection of the 5 mg dose last week.  Anxiety -- alprazolam  refills done last month, due for refills in February.  Eye appt on Thursday. This week.  Hypertension -- controlled with medication  High cholesterol -- on statin therapy    Current Medication: Outpatient Encounter Medications as of 06/25/2023  Medication Sig   ALPRAZolam  (XANAX ) 0.5 MG tablet Take half to one tab as needed a day ( 3 months supply   benazepril  (LOTENSIN ) 20 MG tablet Take 1 tablet (20 mg total) by mouth daily.   chlorpheniramine-HYDROcodone  (TUSSIONEX) 10-8 MG/5ML Take 5 mLs by mouth every 12 (twelve) hours as needed for cough.   empagliflozin  (JARDIANCE ) 25 MG TABS tablet Take 1 tablet (25 mg total) by mouth daily before breakfast.   Estradiol  10 MCG TABS vaginal tablet INSERT 1 TABLET(10 MCG) VAGINALLY 2 TIMES A WEEK. START AFTER COMPLETION OF THE DAILY DOSE FOR 14 DAYS   glucose blood (CONTOUR TEST) test strip Use 1 test strip once daily to check blood sugar for diabetes E11.65   Microlet Lancets MISC Use 1 lancet once daily to check blood sugar for diabetes E11.65   nystatin  cream (MYCOSTATIN ) Apply 1 application topically 2 (two) times daily.   omeprazole  (PRILOSEC) 40 MG capsule Take 1 capsule (40 mg total) by mouth 2 (two) times daily.   predniSONE  (STERAPRED UNI-PAK  21 TAB) 10 MG (21) TBPK tablet Use as directed for 6 days   rosuvastatin  (CRESTOR ) 5 MG tablet Take 1 tablet (5 mg total) by mouth daily.   sertraline  (ZOLOFT ) 100 MG tablet Take 1 tablet (100 mg total) by mouth daily.   tirzepatide  (MOUNJARO ) 5 MG/0.5ML Pen Inject 5 mg into the skin once a week.   traMADol  (ULTRAM ) 50 MG tablet Take one tab po bid for pain prn only   No facility-administered encounter medications on file as of 06/25/2023.    Surgical History: Past Surgical History:  Procedure Laterality Date   CHOLECYSTECTOMY  2000   colon polyectomy     COLONOSCOPY WITH PROPOFOL  N/A 03/31/2018   Procedure: COLONOSCOPY WITH PROPOFOL ;  Surgeon: Therisa Bi, MD;  Location: [facility] ENDOSCOPY;  Service: Gastroenterology;  Laterality: N/A;   COLONOSCOPY WITH PROPOFOL  N/A 10/02/2021   Procedure: COLONOSCOPY WITH PROPOFOL ;  Surgeon: Therisa Bi, MD;  Location: [facility] ENDOSCOPY;  Service: Gastroenterology;  Laterality: N/A;   DENTAL SURGERY     FOOT SURGERY      Medical History: Past Medical History:  Diagnosis Date   Anxiety    Asthma    Depression    Environmental allergies    Hyperlipidemia    Hypertension    Rheumatoid arthritis (HCC)    Sleep apnea 2019   Borderline   Type 2 diabetes mellitus (HCC)     Family History: Family History  Problem Relation Age of Onset   Diabetes Mother  COPD Mother    Uterine cancer Mother 57   Cancer Sister 89       Bile Duct   Stroke Sister    Breast cancer Neg Hx     Social History   Socioeconomic History   Marital status: Married    Spouse name: Not on file   Number of children: Not on file   Years of education: Not on file   Highest education level: Not on file  Occupational History   Not on file  Tobacco Use   Smoking status: Never   Smokeless tobacco: Never  Vaping Use   Vaping status: Never Used  Substance and Sexual Activity   Alcohol use: Yes    Comment: occ.   Drug use: No   Sexual activity: Yes    Birth  control/protection: Post-menopausal  Other Topics Concern   Not on file  Social History Narrative   Not on file   Social Drivers of Health   Financial Resource Strain: Not on file  Food Insecurity: Not on file  Transportation Needs: Not on file  Physical Activity: Not on file  Stress: Not on file  Social Connections: Not on file  Intimate Partner Violence: Not on file      Review of Systems  Constitutional:  Positive for fatigue.  Eyes:  Positive for visual disturbance.  Respiratory: Negative.  Negative for cough, chest tightness, shortness of breath and wheezing.   Cardiovascular: Negative.  Negative for chest pain and palpitations.  Gastrointestinal:  Positive for constipation and nausea.  Neurological:  Positive for headaches.    Vital Signs: BP 136/88   Pulse 71   Temp 98.3 F (36.8 C)   Resp 16   Ht 5' 3 (1.6 m)   Wt 207 lb 6.4 oz (94.1 kg)   SpO2 98%   BMI 36.74 kg/m    Physical Exam Vitals reviewed.  Constitutional:      Appearance: Normal appearance. She is obese.  HENT:     Head: Normocephalic and atraumatic.  Eyes:     Pupils: Pupils are equal, round, and reactive to light.  Cardiovascular:     Rate and Rhythm: Normal rate and regular rhythm.  Pulmonary:     Effort: Pulmonary effort is normal. No respiratory distress.  Neurological:     Mental Status: She is alert and oriented to person, place, and time.  Psychiatric:        Mood and Affect: Mood normal.        Behavior: Behavior normal.        Assessment/Plan: 1. Type 2 diabetes mellitus with other specified complication, without long-term current use of insulin  (HCC) (Primary) Continue mounjaro  injections as prescribed.   2. Hypertension associated with diabetes (HCC) Stable, continue benazepril  as prescribed.   3. Hyperlipidemia associated with type 2 diabetes mellitus (HCC) Continue rosuvastatin  as prescribed.   4. Generalized anxiety disorder Continue sertraline  as prescribed.     General Counseling: Ranelle verbalizes understanding of the findings of todays visit and agrees with plan of treatment. I have discussed any further diagnostic evaluation that may be needed or ordered today. We also reviewed her medications today. she has been encouraged to call the office with any questions or concerns that should arise related to todays visit.    No orders of the defined types were placed in this encounter.   No orders of the defined types were placed in this encounter.   Return in about 6 weeks (around 08/06/2023) for F/U, anxiety  med refill, Gabreil [place] PCP and discuss mounjaro  injections. .   Total time spent:30 Minutes Time spent includes review of chart, medications, test results, and follow up plan with the patient.   [place] Controlled Substance Database was reviewed by me.  This patient was seen by Mardy Maxin, FNP-C in collaboration with Dr. Sigrid Bathe as a part of collaborative care agreement.   Mahrosh Donnell R. Maxin, MSN, FNP-C Internal medicine

## 2023-06-26 ENCOUNTER — Encounter: Payer: Self-pay | Admitting: Nurse Practitioner

## 2023-06-27 DIAGNOSIS — H353121 Nonexudative age-related macular degeneration, left eye, early dry stage: Secondary | ICD-10-CM | POA: Diagnosis not present

## 2023-06-27 DIAGNOSIS — H353112 Nonexudative age-related macular degeneration, right eye, intermediate dry stage: Secondary | ICD-10-CM | POA: Diagnosis not present

## 2023-06-27 DIAGNOSIS — H524 Presbyopia: Secondary | ICD-10-CM | POA: Diagnosis not present

## 2023-06-27 DIAGNOSIS — E119 Type 2 diabetes mellitus without complications: Secondary | ICD-10-CM | POA: Diagnosis not present

## 2023-07-13 ENCOUNTER — Encounter: Payer: Self-pay | Admitting: Nurse Practitioner

## 2023-07-17 ENCOUNTER — Ambulatory Visit: Payer: BC Managed Care – PPO | Admitting: Dermatology

## 2023-08-06 ENCOUNTER — Encounter: Payer: Self-pay | Admitting: Nurse Practitioner

## 2023-08-06 ENCOUNTER — Ambulatory Visit (INDEPENDENT_AMBULATORY_CARE_PROVIDER_SITE_OTHER): Payer: BC Managed Care – PPO | Admitting: Nurse Practitioner

## 2023-08-06 VITALS — BP 136/78 | HR 78 | Temp 98.2°F | Resp 16 | Ht 63.0 in | Wt 197.4 lb

## 2023-08-06 DIAGNOSIS — M1991 Primary osteoarthritis, unspecified site: Secondary | ICD-10-CM

## 2023-08-06 DIAGNOSIS — K2100 Gastro-esophageal reflux disease with esophagitis, without bleeding: Secondary | ICD-10-CM | POA: Diagnosis not present

## 2023-08-06 DIAGNOSIS — F411 Generalized anxiety disorder: Secondary | ICD-10-CM

## 2023-08-06 DIAGNOSIS — E1169 Type 2 diabetes mellitus with other specified complication: Secondary | ICD-10-CM | POA: Diagnosis not present

## 2023-08-06 MED ORDER — ALPRAZOLAM 0.5 MG PO TABS
ORAL_TABLET | ORAL | 0 refills | Status: DC
Start: 2023-08-06 — End: 2023-11-22

## 2023-08-06 MED ORDER — EMPAGLIFLOZIN 25 MG PO TABS: 25.0000 mg | ORAL_TABLET | Freq: Every day | ORAL | 5 refills | Status: DC

## 2023-08-06 MED ORDER — TIRZEPATIDE 5 MG/0.5ML ~~LOC~~ SOAJ: 5.0000 mg | SUBCUTANEOUS | 5 refills | Status: DC

## 2023-08-06 MED ORDER — TRAMADOL HCL 50 MG PO TABS: ORAL_TABLET | ORAL | 0 refills | Status: DC

## 2023-08-06 MED ORDER — OMEPRAZOLE 40 MG PO CPDR: 40.0000 mg | DELAYED_RELEASE_CAPSULE | Freq: Two times a day (BID) | ORAL | 3 refills | Status: DC

## 2023-08-06 NOTE — Progress Notes (Signed)
 [facility] [address] [place], [place] [address]  Internal MEDICINE  Office Visit Note  Patient Name: Kristen Sparks  [id number]  [id number]  Date of Service: 08/06/2023  Chief Complaint  Patient presents with   Diabetes   Hyperlipidemia   Hypertension   Follow-up    HPI Ashayla presents for a follow-up visit for diabetes, weight loss, constipation, headaches and neck pain.  Diabetes -- has been taking mounjaro 5 mg now for a few months.  Weight loss -- down 17 lbs since starting mounjaro. Weight is below 200 lbs.  Constipation Neck pain and headaches.    Current Medication: Outpatient Encounter Medications as of 08/06/2023  Medication Sig   benazepril (LOTENSIN) 20 MG tablet Take 1 tablet (20 mg total) by mouth daily.   Estradiol 10 MCG TABS vaginal tablet INSERT 1 TABLET(10 MCG) VAGINALLY 2 TIMES A WEEK. START AFTER COMPLETION OF THE DAILY DOSE FOR 14 DAYS   glucose blood (CONTOUR TEST) test strip Use 1 test strip once daily to check blood sugar for diabetes E11.65   Microlet Lancets MISC Use 1 lancet once daily to check blood sugar for diabetes E11.65   nystatin cream (MYCOSTATIN) Apply 1 application topically 2 (two) times daily.   rosuvastatin (CRESTOR) 5 MG tablet Take 1 tablet (5 mg total) by mouth daily.   sertraline (ZOLOFT) 100 MG tablet Take 1 tablet (100 mg total) by mouth daily.   [DISCONTINUED] ALPRAZolam (XANAX) 0.5 MG tablet Take half to one tab as needed a day ( 3 months supply   [DISCONTINUED] chlorpheniramine-HYDROcodone (TUSSIONEX) 10-8 MG/5ML Take 5 mLs by mouth every 12 (twelve) hours as needed for cough.   [DISCONTINUED] empagliflozin (JARDIANCE) 25 MG TABS tablet Take 1 tablet (25 mg total) by mouth daily before breakfast.   [DISCONTINUED] omeprazole (PRILOSEC) 40 MG capsule Take 1 capsule (40 mg total) by mouth 2 (two) times daily.   [DISCONTINUED] predniSONE (STERAPRED UNI-PAK 21 TAB) 10 MG (21) TBPK tablet Use as directed for 6 days    [DISCONTINUED] tirzepatide (MOUNJARO) 5 MG/0.5ML Pen Inject 5 mg into the skin once a week.   [DISCONTINUED] traMADol (ULTRAM) 50 MG tablet Take one tab po bid for pain prn only   ALPRAZolam (XANAX) 0.5 MG tablet Take half to one tab as needed a day ( 3 months supply   empagliflozin (JARDIANCE) 25 MG TABS tablet Take 1 tablet (25 mg total) by mouth daily before breakfast.   omeprazole (PRILOSEC) 40 MG capsule Take 1 capsule (40 mg total) by mouth 2 (two) times daily.   tirzepatide [facility]) 5 MG/0.5ML Pen Inject 5 mg into the skin once a week.   traMADol (ULTRAM) 50 MG tablet Take one tab po bid for pain prn only   No facility-administered encounter medications on file as of 08/06/2023.    Surgical History: Past Surgical History:  Procedure Laterality Date   CHOLECYSTECTOMY  2000   colon polyectomy     COLONOSCOPY WITH PROPOFOL N/A 03/31/2018   Procedure: COLONOSCOPY WITH PROPOFOL;  Surgeon: Wyline Mood, MD;  Location: [facility] ENDOSCOPY;  Service: Gastroenterology;  Laterality: N/A;   COLONOSCOPY WITH PROPOFOL N/A 10/02/2021   Procedure: COLONOSCOPY WITH PROPOFOL;  Surgeon: Wyline Mood, MD;  Location: [facility] ENDOSCOPY;  Service: Gastroenterology;  Laterality: N/A;   DENTAL SURGERY     FOOT SURGERY      Medical History: Past Medical History:  Diagnosis Date   Anxiety    Asthma    Depression    Environmental allergies  Hyperlipidemia    Hypertension    Rheumatoid arthritis (HCC)    Sleep apnea 2019   Borderline   Type 2 diabetes mellitus (HCC)     Family History: Family History  Problem Relation Age of Onset   Diabetes Mother    COPD Mother    Uterine cancer Mother 63   Cancer Sister 23       Bile Duct   Stroke Sister    Breast cancer Neg Hx     Social History   Socioeconomic History   Marital status: Married    Spouse name: Not on file   Number of children: Not on file   Years of education: Not on file   Highest education level: Not on file  Occupational History    Not on file  Tobacco Use   Smoking status: Never   Smokeless tobacco: Never  Vaping Use   Vaping status: Never Used  Substance and Sexual Activity   Alcohol use: Yes    Comment: occ.   Drug use: No   Sexual activity: Yes    Birth control/protection: Post-menopausal  Other Topics Concern   Not on file  Social History Narrative   Not on file   Social Drivers of Health   Financial Resource Strain: Not on file  Food Insecurity: Not on file  Transportation Needs: Not on file  Physical Activity: Not on file  Stress: Not on file  Social Connections: Not on file  Intimate Partner Violence: Not on file      Review of Systems  Constitutional:  Positive for fatigue.  Eyes:  Positive for visual disturbance.  Respiratory: Negative.  Negative for cough, chest tightness, shortness of breath and wheezing.   Cardiovascular: Negative.  Negative for chest pain and palpitations.  Gastrointestinal:  Positive for constipation and nausea.  Neurological:  Positive for headaches.    Vital Signs: BP 136/78   Pulse 78   Temp 98.2 F (36.8 C)   Resp 16   Ht 5\' 3"  (1.6 m)   Wt 197 lb 6.4 oz (89.5 kg)   SpO2 97%   BMI 34.97 kg/m    Physical Exam Vitals reviewed.  Constitutional:      Appearance: Normal appearance. She is obese.  HENT:     Head: Normocephalic and atraumatic.  Eyes:     Pupils: Pupils are equal, round, and reactive to light.  Cardiovascular:     Rate and Rhythm: Normal rate and regular rhythm.  Pulmonary:     Effort: Pulmonary effort is normal. No respiratory distress.  Neurological:     Mental Status: She is alert and oriented to person, place, and time.  Psychiatric:        Mood and Affect: Mood normal.        Behavior: Behavior normal.        Assessment/Plan: 1. Type 2 diabetes mellitus with other specified complication, without long-term current use of insulin (HCC) (Primary) Continue mounjaro and jardiance as prescribed. Follow up in a few months.   - empagliflozin (JARDIANCE) 25 MG TABS tablet; Take 1 tablet (25 mg total) by mouth daily before breakfast.  Dispense: 30 tablet; Refill: 5 - tirzepatide (MOUNJARO) 5 MG/0.5ML Pen; Inject 5 mg into the skin once a week.  Dispense: 2 mL; Refill: 5  2. Gastroesophageal reflux disease with esophagitis without hemorrhage Continue omeprazole as prescribed.  - omeprazole (PRILOSEC) 40 MG capsule; Take 1 capsule (40 mg total) by mouth 2 (two) times daily.  Dispense: 180 capsule;  Refill: 3  3. Primary localized osteoarthrosis Continue prn tramadol as prescribed. Follow up in 3 months for additional refills.  - traMADol (ULTRAM) 50 MG tablet; Take one tab po bid for pain prn only  Dispense: 180 tablet; Refill: 0  4. Generalized anxiety disorder Continue alprazolam as prescribed, follow up in 3 months for additional refills  - ALPRAZolam (XANAX) 0.5 MG tablet; Take half to one tab as needed a day ( 3 months supply  Dispense: 90 tablet; Refill: 0   General Counseling: Haizel verbalizes understanding of the findings of todays visit and agrees with plan of treatment. I have discussed any further diagnostic evaluation that may be needed or ordered today. We also reviewed her medications today. she has been encouraged to call the office with any questions or concerns that should arise related to todays visit.    No orders of the defined types were placed in this encounter.   Meds ordered this encounter  Medications   ALPRAZolam (XANAX) 0.5 MG tablet    Sig: Take half to one tab as needed a day ( 3 months supply    Dispense:  90 tablet    Refill:  0    90 day supply.   empagliflozin (JARDIANCE) 25 MG TABS tablet    Sig: Take 1 tablet (25 mg total) by mouth daily before breakfast.    Dispense:  30 tablet    Refill:  5   omeprazole (PRILOSEC) 40 MG capsule    Sig: Take 1 capsule (40 mg total) by mouth 2 (two) times daily.    Dispense:  180 capsule    Refill:  3   tirzepatide (MOUNJARO) 5  MG/0.5ML Pen    Sig: Inject 5 mg into the skin once a week.    Dispense:  2 mL    Refill:  5    Dx code E11.65, please send prior auth asap if required.   traMADol (ULTRAM) 50 MG tablet    Sig: Take one tab po bid for pain prn only    Dispense:  180 tablet    Refill:  0    90 day supply please.    Return for previously scheduled, CPE, Mirra Basilio PCP in late may.   Total time spent:30 Minutes Time spent includes review of chart, medications, test results, and follow up plan with the patient.   [place] Controlled Substance Database was reviewed by me.  This patient was seen by Sallyanne Kuster, FNP-C in collaboration with Dr. Beverely Risen as a part of collaborative care agreement.   Yazleemar Strassner R. Tedd Sias, MSN, FNP-C Internal medicine

## 2023-09-08 ENCOUNTER — Other Ambulatory Visit: Payer: Self-pay | Admitting: Nurse Practitioner

## 2023-09-08 ENCOUNTER — Encounter: Payer: Self-pay | Admitting: Nurse Practitioner

## 2023-09-08 DIAGNOSIS — F411 Generalized anxiety disorder: Secondary | ICD-10-CM

## 2023-09-15 DIAGNOSIS — N390 Urinary tract infection, site not specified: Secondary | ICD-10-CM | POA: Diagnosis not present

## 2023-09-15 DIAGNOSIS — I10 Essential (primary) hypertension: Secondary | ICD-10-CM | POA: Diagnosis not present

## 2023-09-15 DIAGNOSIS — R197 Diarrhea, unspecified: Secondary | ICD-10-CM | POA: Diagnosis not present

## 2023-09-15 DIAGNOSIS — R300 Dysuria: Secondary | ICD-10-CM | POA: Diagnosis not present

## 2023-10-17 ENCOUNTER — Ambulatory Visit (INDEPENDENT_AMBULATORY_CARE_PROVIDER_SITE_OTHER): Admitting: Nurse Practitioner

## 2023-10-17 ENCOUNTER — Encounter: Payer: Self-pay | Admitting: Nurse Practitioner

## 2023-10-17 VITALS — BP 132/88 | HR 74 | Temp 98.3°F | Resp 16 | Ht 63.0 in | Wt 191.4 lb

## 2023-10-17 DIAGNOSIS — J0100 Acute maxillary sinusitis, unspecified: Secondary | ICD-10-CM

## 2023-10-17 DIAGNOSIS — B379 Candidiasis, unspecified: Secondary | ICD-10-CM | POA: Diagnosis not present

## 2023-10-17 DIAGNOSIS — K5903 Drug induced constipation: Secondary | ICD-10-CM

## 2023-10-17 DIAGNOSIS — E1169 Type 2 diabetes mellitus with other specified complication: Secondary | ICD-10-CM | POA: Diagnosis not present

## 2023-10-17 MED ORDER — FLUCONAZOLE 150 MG PO TABS: 150.0000 mg | ORAL_TABLET | Freq: Once | ORAL | 0 refills | Status: DC

## 2023-10-17 MED ORDER — AMOXICILLIN-POT CLAVULANATE 875-125 MG PO TABS: 1.0000 | ORAL_TABLET | Freq: Two times a day (BID) | ORAL | 0 refills | Status: AC

## 2023-10-17 NOTE — Progress Notes (Signed)
 [facility] [address] [place], [place] [address]  Internal MEDICINE  Office Visit Note  Patient Name: Kristen Sparks  [id number]  [id number]  Date of Service: 10/17/2023  Chief Complaint  Patient presents with   Acute Visit    Difficulty swallowing      HPI Kristen Sparks presents for an acute sick visit for sinus infection and constipation.  Sinus drainage, dry mouth, ears feel full, runny nose, nasal congestion, ear pain bilaterally, mild cough, sore throat. Constipation -- takes stool softener daily and also takes milk of magnesia as needed Type 2 diabetes -- taking mounjaro, glucose levels are improving. Patient lost 6 more lbs as well.    Current Medication:  Outpatient Encounter Medications as of 10/17/2023  Medication Sig   amoxicillin -clavulanate (AUGMENTIN ) 875-125 MG tablet Take 1 tablet by mouth 2 (two) times daily for 10 days. Take with food   fluconazole  (DIFLUCAN ) 150 MG tablet Take 1 tablet (150 mg total) by mouth once for 1 dose. May take an additional dose after 3 days if still symptomatic.   ALPRAZolam  (XANAX ) 0.5 MG tablet Take half to one tab as needed a day ( 3 months supply   benazepril  (LOTENSIN ) 20 MG tablet Take 1 tablet (20 mg total) by mouth daily.   empagliflozin  (JARDIANCE ) 25 MG TABS tablet Take 1 tablet (25 mg total) by mouth daily before breakfast.   Estradiol  10 MCG TABS vaginal tablet INSERT 1 TABLET(10 MCG) VAGINALLY 2 TIMES A WEEK. START AFTER COMPLETION OF THE DAILY DOSE FOR 14 DAYS   glucose blood (CONTOUR TEST) test strip Use 1 test strip once daily to check blood sugar for diabetes E11.65   Microlet Lancets MISC Use 1 lancet once daily to check blood sugar for diabetes E11.65   nystatin  cream (MYCOSTATIN ) Apply 1 application topically 2 (two) times daily.   omeprazole  (PRILOSEC) 40 MG capsule Take 1 capsule (40 mg total) by mouth 2 (two) times daily.   rosuvastatin  (CRESTOR ) 5 MG tablet Take 1 tablet (5 mg total) by mouth daily.    sertraline  (ZOLOFT ) 100 MG tablet TAKE 1 TABLET(100 MG) BY MOUTH DAILY   tirzepatide (MOUNJARO) 5 MG/0.5ML Pen Inject 5 mg into the skin once a week.   traMADol  (ULTRAM ) 50 MG tablet Take one tab po bid for pain prn only   No facility-administered encounter medications on file as of 10/17/2023.      Medical History: Past Medical History:  Diagnosis Date   Anxiety    Asthma    Depression    Environmental allergies    Hyperlipidemia    Hypertension    Rheumatoid arthritis (HCC)    Sleep apnea 2019   Borderline   Type 2 diabetes mellitus (HCC)      Vital Signs: BP 132/88   Pulse 74   Temp 98.3 F (36.8 C)   Resp 16   Ht 5\' 3"  (1.6 m)   Wt 191 lb 6.4 oz (86.8 kg)   SpO2 99%   BMI 33.90 kg/m    Review of Systems  Constitutional:  Positive for fatigue.  HENT:  Positive for congestion, ear pain, postnasal drip, rhinorrhea, sinus pressure, sinus pain, sore throat and trouble swallowing.   Eyes:  Positive for visual disturbance.  Respiratory: Negative.  Negative for cough, chest tightness, shortness of breath and wheezing.   Cardiovascular: Negative.  Negative for chest pain and palpitations.  Gastrointestinal:  Positive for abdominal distention, constipation and nausea.  Neurological:  Positive for headaches.  Physical Exam Vitals reviewed.  Constitutional:      Appearance: Normal appearance. She is obese. She is ill-appearing.  HENT:     Head: Normocephalic and atraumatic.     Right Ear: Tympanic membrane, ear canal and external ear normal.     Left Ear: Tympanic membrane, ear canal and external ear normal.     Nose: Congestion and rhinorrhea present.     Mouth/Throat:     Mouth: Mucous membranes are moist.     Pharynx: Posterior oropharyngeal erythema present.  Eyes:     Pupils: Pupils are equal, round, and reactive to light.  Cardiovascular:     Rate and Rhythm: Normal rate and regular rhythm.     Heart sounds: Normal heart sounds. No murmur  heard. Pulmonary:     Effort: Pulmonary effort is normal. No respiratory distress.     Breath sounds: Normal breath sounds. No wheezing.  Neurological:     Mental Status: She is alert and oriented to person, place, and time.  Psychiatric:        Mood and Affect: Mood normal.        Behavior: Behavior normal.       Assessment/Plan: 1. Acute non-recurrent maxillary sinusitis (Primary) Augmentin  prescribed, take until gone.  - amoxicillin -clavulanate (AUGMENTIN ) 875-125 MG tablet; Take 1 tablet by mouth 2 (two) times daily for 10 days. Take with food  Dispense: 20 tablet; Refill: 0  2. Type 2 diabetes mellitus with other specified complication, without long-term current use of insulin  (HCC) Continue mounjaro as prescribed.   3. Drug-induced constipation Continue stool softener daily or twice daily as needed.   4. Antibiotic-induced yeast infection Fluconazole  prescribed just in care - fluconazole  (DIFLUCAN ) 150 MG tablet; Take 1 tablet (150 mg total) by mouth once for 1 dose. May take an additional dose after 3 days if still symptomatic.  Dispense: 3 tablet; Refill: 0   General Counseling: [name] verbalizes understanding of the findings of todays visit and agrees with plan of treatment. I have discussed any further diagnostic evaluation that may be needed or ordered today. We also reviewed her medications today. she has been encouraged to call the office with any questions or concerns that should arise related to todays visit.    Counseling:    No orders of the defined types were placed in this encounter.   Meds ordered this encounter  Medications   amoxicillin -clavulanate (AUGMENTIN ) 875-125 MG tablet    Sig: Take 1 tablet by mouth 2 (two) times daily for 10 days. Take with food    Dispense:  20 tablet    Refill:  0    Fill new script today   fluconazole  (DIFLUCAN ) 150 MG tablet    Sig: Take 1 tablet (150 mg total) by mouth once for 1 dose. May take an additional dose  after 3 days if still symptomatic.    Dispense:  3 tablet    Refill:  0    Fill new script today    Return if symptoms worsen or fail to improve.  [place] Controlled Substance Database was reviewed by me for overdose risk score (ORS)  Time spent:30 Minutes Time spent with patient included reviewing progress notes, labs, imaging studies, and discussing plan for follow up.   This patient was seen by Laurence Pons, FNP-C in collaboration with Dr. Verneta Gone as a part of collaborative care agreement.  Sagrario Lineberry R. Bobbi Burow, MSN, FNP-C Internal Medicine

## 2023-10-19 ENCOUNTER — Encounter: Payer: Self-pay | Admitting: Nurse Practitioner

## 2023-11-14 ENCOUNTER — Ambulatory Visit
Admission: EM | Admit: 2023-11-14 | Discharge: 2023-11-14 | Disposition: A | Discharge status: Home / Self Care | Attending: Emergency Medicine | Admitting: Emergency Medicine

## 2023-11-14 ENCOUNTER — Ambulatory Visit (HOSPITAL_COMMUNITY): Payer: Self-pay

## 2023-11-14 ENCOUNTER — Ambulatory Visit (INDEPENDENT_AMBULATORY_CARE_PROVIDER_SITE_OTHER)

## 2023-11-14 DIAGNOSIS — M25512 Pain in left shoulder: Secondary | ICD-10-CM

## 2023-11-14 DIAGNOSIS — M19012 Primary osteoarthritis, left shoulder: Secondary | ICD-10-CM | POA: Diagnosis not present

## 2023-11-14 NOTE — Discharge Instructions (Addendum)
 Wear the sling as directed.  Take ibuprofen  as directed.  Rest your shoulder.  Follow-up with an orthopedist such as the one listed below.

## 2023-11-14 NOTE — ED Triage Notes (Signed)
 Patient to Urgent Care with complaints of left sided shoulder pain.  Symptoms started 5 days ago. Reports she watching standing up out of a beach chair and felt a "crack". Pain has continued to worsen.   Denies hx of injury.  Taking ibuprofen . Using hold/ cold packs.

## 2023-11-14 NOTE — ED Provider Notes (Addendum)
 [facility]    CSN: [id number] Arrival date & time: 11/14/23  0801      History   Chief Complaint Chief Complaint  Patient presents with   Shoulder Pain    HPI Kristen Sparks is a 63 y.o. female.  Patient presents with 5-day history of left shoulder pain.  She was getting up from a beachchair and reached behind her; she felt a "crack" in her shoulder.  She has had pain since then.  She has been treating the pain with ibuprofen  and IcyHot with minimal relief.  No numbness, weakness, paresthesias, wounds, bruising, swelling.  The history is provided by the patient and medical records.    Past Medical History:  Diagnosis Date   Anxiety    Asthma    Depression    Environmental allergies    Hyperlipidemia    Hypertension    Rheumatoid arthritis (HCC)    Sleep apnea 2019   Borderline   Type 2 diabetes mellitus [facility])     Patient Active Problem List   Diagnosis Date Noted   Hyperlipidemia associated with type 2 diabetes mellitus (HCC) 05/15/2023   Pneumonia due to COVID-19 virus 04/18/2020   Asthma    Hypokalemia    Acute respiratory failure with hypoxia (HCC)    Constipation 11/24/2019   Gastroesophageal reflux disease with esophagitis without hemorrhage 11/24/2019   Acute recurrent frontal sinusitis 10/07/2019   Diabetes mellitus (HCC) 02/08/2019   Gastroenteritis 02/08/2019   Nausea 02/08/2019   Depression with anxiety 11/07/2018   Chest pain 09/26/2018   Primary localized osteoarthrosis 09/26/2018   Muscle pain 09/26/2018   Flu-like symptoms 07/09/2018   Acute upper respiratory infection 07/09/2018   Cough 07/09/2018   Wheezing 07/09/2018   Type 2 diabetes mellitus with hyperglycemia (HCC) 08/02/2017   Essential (primary) hypertension 08/02/2017   Other obesity due to excess calories 08/02/2017   Generalized anxiety disorder 08/02/2017    Past Surgical History:  Procedure Laterality Date   CHOLECYSTECTOMY  2000   colon polyectomy     COLONOSCOPY  WITH PROPOFOL  N/A 03/31/2018   Procedure: COLONOSCOPY WITH PROPOFOL ;  Surgeon: Luke Salaam, MD;  Location: [facility] ENDOSCOPY;  Service: Gastroenterology;  Laterality: N/A;   COLONOSCOPY WITH PROPOFOL  N/A 10/02/2021   Procedure: COLONOSCOPY WITH PROPOFOL ;  Surgeon: Luke Salaam, MD;  Location: [facility] ENDOSCOPY;  Service: Gastroenterology;  Laterality: N/A;   DENTAL SURGERY     FOOT SURGERY      OB History     Gravida  2   Para  1   Term  1   Preterm      AB  1   Living  1      SAB  1   IAB      Ectopic      Multiple      Live Births  1            Home Medications    Prior to Admission medications   Medication Sig Start Date End Date Taking? Authorizing Provider  ALPRAZolam  (XANAX ) 0.5 MG tablet Take half to one tab as needed a day ( 3 months supply 08/06/23   Abernathy, Alyssa, NP  benazepril  (LOTENSIN ) 20 MG tablet Take 1 tablet (20 mg total) by mouth daily. 02/19/23   Laurence Pons, NP  empagliflozin  (JARDIANCE ) 25 MG TABS tablet Take 1 tablet (25 mg total) by mouth daily before breakfast. 08/06/23   Laurence Pons, NP  Estradiol  10 MCG TABS vaginal tablet INSERT 1 TABLET(10 MCG)  VAGINALLY 2 TIMES A WEEK. START AFTER COMPLETION OF THE DAILY DOSE FOR 14 DAYS 01/04/23   Alise Appl, CNM  glucose blood (CONTOUR TEST) test strip Use 1 test strip once daily to check blood sugar for diabetes E11.65 05/04/23   Laurence Pons, NP  Microlet Lancets MISC Use 1 lancet once daily to check blood sugar for diabetes E11.65 05/04/23   Laurence Pons, NP  nystatin  cream (MYCOSTATIN ) Apply 1 application topically 2 (two) times daily. 10/19/20   Darl Edu, MD  omeprazole  (PRILOSEC) 40 MG capsule Take 1 capsule (40 mg total) by mouth 2 (two) times daily. 08/06/23   Laurence Pons, NP  rosuvastatin  (CRESTOR ) 5 MG tablet Take 1 tablet (5 mg total) by mouth daily. 02/19/23   Laurence Pons, NP  sertraline  (ZOLOFT ) 100 MG tablet TAKE 1 TABLET(100 MG) BY MOUTH DAILY 09/09/23    Abernathy, Alyssa, NP  tirzepatide [facility]) 5 MG/0.5ML Pen Inject 5 mg into the skin once a week. 08/06/23   Laurence Pons, NP  traMADol  (ULTRAM ) 50 MG tablet Take one tab po bid for pain prn only 08/06/23   Laurence Pons, NP    Family History Family History  Problem Relation Age of Onset   Diabetes Mother    COPD Mother    Uterine cancer Mother 52   Cancer Sister 12       Bile Duct   Stroke Sister    Breast cancer Neg Hx     Social History Social History   Tobacco Use   Smoking status: Never   Smokeless tobacco: Never  Vaping Use   Vaping status: Never Used  Substance Use Topics   Alcohol use: Yes    Comment: occ.   Drug use: No     Allergies   Azithromycin and Contrast media [iodinated contrast media]   Review of Systems Review of Systems  Constitutional:  Negative for chills and fever.  Musculoskeletal:  Positive for arthralgias. Negative for joint swelling.  Skin:  Negative for color change, rash and wound.  Neurological:  Negative for weakness and numbness.     Physical Exam Triage Vital Signs ED Triage Vitals  Encounter Vitals Group     BP 11/14/23 0815 138/83     Systolic BP Percentile --      Diastolic BP Percentile --      Pulse Rate 11/14/23 0815 72     Resp 11/14/23 0815 18     Temp 11/14/23 0815 97.7 F (36.5 C)     Temp src --      SpO2 11/14/23 0815 98 %     Weight --      Height --      Head Circumference --      Peak Flow --      Pain Score 11/14/23 0818 6     Pain Loc --      Pain Education --      Exclude from Growth Chart --    No data found.  Updated Vital Signs BP 138/83   Pulse 72   Temp 97.7 F (36.5 C)   Resp 18   SpO2 98%   Visual Acuity Right Eye Distance:   Left Eye Distance:   Bilateral Distance:    Right Eye Near:   Left Eye Near:    Bilateral Near:     Physical Exam Constitutional:      General: She is not in acute distress. HENT:     Mouth/Throat:     Mouth: Mucous  membranes are moist.   Cardiovascular:     Rate and Rhythm: Normal rate and regular rhythm.  Pulmonary:     Effort: Pulmonary effort is normal. No respiratory distress.  Musculoskeletal:        General: Tenderness present. No swelling or deformity.       Arms:     Comments: Left shoulder tender to palpation, limited ROM.  No edema, ecchymosis, erythema, wound.  LUE: FROM in elbow, wrist, fingers; 2+ radial pulse; sensation intact.   Skin:    General: Skin is warm and dry.     Findings: No bruising, erythema, lesion or rash.  Neurological:     General: No focal deficit present.     Mental Status: She is alert.     Sensory: No sensory deficit.     Motor: No weakness.      UC Treatments / Results  Labs (all labs ordered are listed, but only abnormal results are displayed) Labs Reviewed - No data to display  EKG   Radiology DG Shoulder Left Result Date: 11/14/2023 CLINICAL DATA:  Left shoulder pain. Symptoms started 5 days ago when standing up at the beach chair and felt a crack in left shoulder. EXAM: LEFT SHOULDER - 2+ VIEW COMPARISON:  None Available. FINDINGS: There is mildly decreased bone mineralization. Moderate inferior glenoid degenerative osteophytosis. Mild inferior humeral head-neck junction degenerative osteophytosis. Mild glenohumeral joint space narrowing. Mild acromioclavicular joint space narrowing, subchondral sclerosis, subchondral cystic change, and inferior degenerative spurring. No acute fracture or dislocation. IMPRESSION: Mild-to-moderate glenohumeral and mild acromioclavicular osteoarthritis. Electronically Signed   By: Bertina Broccoli M.D.   On: 11/14/2023 08:58    Procedures Procedures (including critical care time)  Medications Ordered in UC Medications - No data to display  Initial Impression / Assessment and Plan / UC Course  I have reviewed the triage vital signs and the nursing notes.  Pertinent labs & imaging results that were available during my care of the patient  were reviewed by me and considered in my medical decision making (see chart for details).    Left shoulder pain.  Xray shows osteoarthritis.  Treating with sling, ibuprofen , rest.  Instructed patient to follow up with an orthopedist.  Contact information for on-call orthopedic provided.  Education provided on shoulder pain.  Patient agrees to plan of care.    Final Clinical Impressions(s) / UC Diagnoses   Final diagnoses:  Acute pain of left shoulder     Discharge Instructions      Wear the sling as directed.  Take ibuprofen  as directed.  Rest your shoulder.  Follow-up with an orthopedist such as the one listed below.     ED Prescriptions   None    PDMP not reviewed this encounter.   Wellington Half, NP 11/14/23 0909    Wellington Half, NP 11/14/23 [phone]

## 2023-11-15 ENCOUNTER — Telehealth: Payer: Self-pay

## 2023-11-15 DIAGNOSIS — M25512 Pain in left shoulder: Secondary | ICD-10-CM | POA: Diagnosis not present

## 2023-11-15 NOTE — Telephone Encounter (Signed)
 Patient called to let us  know she was seen at urgent care yesterday and they told her she needs to see and Orthopedic doctor, per Alyssa she can call [facility] and get her an appointment or go their walk in clinic. Patient was given the address and phone number.

## 2023-11-22 ENCOUNTER — Ambulatory Visit (INDEPENDENT_AMBULATORY_CARE_PROVIDER_SITE_OTHER): Payer: BC Managed Care – PPO | Admitting: Nurse Practitioner

## 2023-11-22 ENCOUNTER — Encounter: Payer: Self-pay | Admitting: Nurse Practitioner

## 2023-11-22 VITALS — BP 130/88 | HR 72 | Temp 98.1°F | Resp 16 | Ht 63.0 in | Wt 190.6 lb

## 2023-11-22 DIAGNOSIS — E1169 Type 2 diabetes mellitus with other specified complication: Secondary | ICD-10-CM | POA: Diagnosis not present

## 2023-11-22 DIAGNOSIS — R221 Localized swelling, mass and lump, neck: Secondary | ICD-10-CM

## 2023-11-22 DIAGNOSIS — E1159 Type 2 diabetes mellitus with other circulatory complications: Secondary | ICD-10-CM | POA: Diagnosis not present

## 2023-11-22 DIAGNOSIS — Z0001 Encounter for general adult medical examination with abnormal findings: Secondary | ICD-10-CM

## 2023-11-22 DIAGNOSIS — M1991 Primary osteoarthritis, unspecified site: Secondary | ICD-10-CM | POA: Diagnosis not present

## 2023-11-22 DIAGNOSIS — E538 Deficiency of other specified B group vitamins: Secondary | ICD-10-CM

## 2023-11-22 DIAGNOSIS — E559 Vitamin D deficiency, unspecified: Secondary | ICD-10-CM

## 2023-11-22 DIAGNOSIS — E785 Hyperlipidemia, unspecified: Secondary | ICD-10-CM

## 2023-11-22 DIAGNOSIS — F411 Generalized anxiety disorder: Secondary | ICD-10-CM

## 2023-11-22 DIAGNOSIS — I152 Hypertension secondary to endocrine disorders: Secondary | ICD-10-CM

## 2023-11-22 DIAGNOSIS — Z1231 Encounter for screening mammogram for malignant neoplasm of breast: Secondary | ICD-10-CM

## 2023-11-22 LAB — POCT GLYCOSYLATED HEMOGLOBIN (HGB A1C): Hemoglobin A1C: 5.9 % — AB (ref 4.0–5.6)

## 2023-11-22 MED ORDER — EMPAGLIFLOZIN 25 MG PO TABS: 25.0000 mg | ORAL_TABLET | Freq: Every day | ORAL | 5 refills | Status: AC

## 2023-11-22 MED ORDER — ROSUVASTATIN CALCIUM 5 MG PO TABS: 5.0000 mg | ORAL_TABLET | Freq: Every day | ORAL | 3 refills | Status: DC

## 2023-11-22 MED ORDER — TIRZEPATIDE 5 MG/0.5ML ~~LOC~~ SOAJ: 5.0000 mg | SUBCUTANEOUS | 5 refills | Status: DC

## 2023-11-22 MED ORDER — BENAZEPRIL HCL 20 MG PO TABS: 20.0000 mg | ORAL_TABLET | Freq: Every day | ORAL | 3 refills | Status: AC

## 2023-11-22 MED ORDER — ALPRAZOLAM 0.5 MG PO TABS
ORAL_TABLET | ORAL | 0 refills | Status: DC
Start: 2023-11-22 — End: 2024-04-15

## 2023-11-22 MED ORDER — PREDNISONE 10 MG (21) PO TBPK: ORAL_TABLET | ORAL | 0 refills | Status: DC

## 2023-11-22 MED ORDER — TRAMADOL HCL 50 MG PO TABS: ORAL_TABLET | ORAL | 0 refills | Status: DC

## 2023-11-22 NOTE — Progress Notes (Signed)
 [facility] [address] [place], [place] [address]  Internal MEDICINE  Office Visit Note  Patient Name: Kristen Sparks  [id number]  [id number]  Date of Service: 11/22/2023  Chief Complaint  Patient presents with   Depression   Diabetes   Hyperlipidemia   Hypertension   Annual Exam    HPI Kristen Sparks presents for an annual well visit and physical exam.  Well-appearing 63 y.o. female with diabetes, hypertension, asthma, GERD, hyperlipidemia, OA, obesity, depression, and anxiety. Routine CRC screening: due in 2028 Routine mammogram: due now  DEXA scan: due in 3 more years  Pap smear: due in 2028 Eye exam: seen by her eye doctor in january foot exam: done Labs: due for routine labs  New or worsening pain: left shoulder Other concerns: left shoulder pain, has recently been to urgent care then went to [facility]. Did a 6 day prednisone  taper. Wants to do this 1 more time.  A1c is significantly improved to 5.9 today from 7.7   Current Medication: Outpatient Encounter Medications as of 11/22/2023  Medication Sig   Estradiol  10 MCG TABS vaginal tablet INSERT 1 TABLET(10 MCG) VAGINALLY 2 TIMES A WEEK. START AFTER COMPLETION OF THE DAILY DOSE FOR 14 DAYS   glucose blood (CONTOUR TEST) test strip Use 1 test strip once daily to check blood sugar for diabetes E11.65   Microlet Lancets MISC Use 1 lancet once daily to check blood sugar for diabetes E11.65   nystatin  cream (MYCOSTATIN ) Apply 1 application topically 2 (two) times daily.   omeprazole  (PRILOSEC) 40 MG capsule Take 1 capsule (40 mg total) by mouth 2 (two) times daily.   sertraline  (ZOLOFT ) 100 MG tablet TAKE 1 TABLET(100 MG) BY MOUTH DAILY   [DISCONTINUED] ALPRAZolam  (XANAX ) 0.5 MG tablet Take half to one tab as needed a day ( 3 months supply   [DISCONTINUED] benazepril  (LOTENSIN ) 20 MG tablet Take 1 tablet (20 mg total) by mouth daily.   [DISCONTINUED] empagliflozin  (JARDIANCE ) 25 MG TABS tablet Take 1 tablet (25 mg  total) by mouth daily before breakfast.   [DISCONTINUED] rosuvastatin  (CRESTOR ) 5 MG tablet Take 1 tablet (5 mg total) by mouth daily.   [DISCONTINUED] tirzepatide  (MOUNJARO ) 5 MG/0.5ML Pen Inject 5 mg into the skin once a week.   [DISCONTINUED] traMADol  (ULTRAM ) 50 MG tablet Take one tab po bid for pain prn only   ALPRAZolam  (XANAX ) 0.5 MG tablet Take half to one tab as needed a day ( 3 months supply   benazepril  (LOTENSIN ) 20 MG tablet Take 1 tablet (20 mg total) by mouth daily.   empagliflozin  (JARDIANCE ) 25 MG TABS tablet Take 1 tablet (25 mg total) by mouth daily before breakfast.   rosuvastatin  (CRESTOR ) 5 MG tablet Take 1 tablet (5 mg total) by mouth daily.   tirzepatide  (MOUNJARO ) 5 MG/0.5ML Pen Inject 5 mg into the skin once a week.   traMADol  (ULTRAM ) 50 MG tablet Take one tab po bid for pain prn only   No facility-administered encounter medications on file as of 11/22/2023.    Surgical History: Past Surgical History:  Procedure Laterality Date   CHOLECYSTECTOMY  2000   colon polyectomy     COLONOSCOPY WITH PROPOFOL  N/A 03/31/2018   Procedure: COLONOSCOPY WITH PROPOFOL ;  Surgeon: Luke Salaam, MD;  Location[facility] ENDOSCOPY;  Service: Gastroenterology;  Laterality: N/A;   COLONOSCOPY WITH PROPOFOL  N/A 10/02/2021   Procedure: COLONOSCOPY WITH PROPOFOL ;  Surgeon: Luke Salaam, MD;  Location: [facility] ENDOSCOPY;  Service: Gastroenterology;  Laterality: N/A;  DENTAL SURGERY     FOOT SURGERY      Medical History: Past Medical History:  Diagnosis Date   Anxiety    Asthma    Depression    Environmental allergies    Hyperlipidemia    Hypertension    Rheumatoid arthritis (HCC)    Sleep apnea 2019   Borderline   Type 2 diabetes mellitus (HCC)     Family History: Family History  Problem Relation Age of Onset   Diabetes Mother    COPD Mother    Uterine cancer Mother 32   Cancer Sister 79       Bile Duct   Stroke Sister    Breast cancer Neg Hx     Social History    Socioeconomic History   Marital status: Married    Spouse name: Not on file   Number of children: Not on file   Years of education: Not on file   Highest education level: Not on file  Occupational History   Not on file  Tobacco Use   Smoking status: Never   Smokeless tobacco: Never  Vaping Use   Vaping status: Never Used  Substance and Sexual Activity   Alcohol use: Yes    Comment: occ.   Drug use: No   Sexual activity: Yes    Birth control/protection: Post-menopausal  Other Topics Concern   Not on file  Social History Narrative   Not on file   Social Drivers of Health   Financial Resource Strain: Not on file  Food Insecurity: Not on file  Transportation Needs: Not on file  Physical Activity: Not on file  Stress: Not on file  Social Connections: Not on file  Intimate Partner Violence: Not on file      Review of Systems  Constitutional:  Negative for activity change, appetite change, chills, fatigue, fever and unexpected weight change.  HENT: Negative.  Negative for congestion, ear pain, rhinorrhea, sore throat and trouble swallowing.   Eyes: Negative.   Respiratory: Negative.  Negative for cough, chest tightness, shortness of breath and wheezing.   Cardiovascular: Negative.  Negative for chest pain.  Gastrointestinal: Negative.  Negative for abdominal pain, blood in stool, constipation, diarrhea, nausea and vomiting.  Endocrine: Negative.   Genitourinary: Negative.  Negative for difficulty urinating, dysuria, frequency, hematuria and urgency.  Musculoskeletal: Negative.  Negative for arthralgias, back pain, joint swelling, myalgias and neck pain.  Skin: Negative.  Negative for rash and wound.  Allergic/Immunologic: Negative.  Negative for immunocompromised state.  Neurological: Negative.  Negative for dizziness, seizures, numbness and headaches.  Hematological: Negative.   Psychiatric/Behavioral:  Negative for behavioral problems, self-injury, sleep disturbance  and suicidal ideas. The patient is not nervous/anxious.     Vital Signs: BP 130/88   Pulse 72   Temp 98.1 F (36.7 C)   Resp 16   Ht 5\' 3"  (1.6 m)   Wt 190 lb 9.6 oz (86.5 kg)   SpO2 95%   BMI 33.76 kg/m    Physical Exam Vitals reviewed.  Constitutional:      General: She is awake. She is not in acute distress.    Appearance: Normal appearance. She is well-developed. She is obese. She is not ill-appearing or diaphoretic.  HENT:     Head: Normocephalic and atraumatic.     Right Ear: Tympanic membrane, ear canal and external ear normal.     Left Ear: Tympanic membrane, ear canal and external ear normal.     Nose: Nose normal. No  congestion or rhinorrhea.     Mouth/Throat:     Lips: Pink.     Mouth: Mucous membranes are moist.     Pharynx: Oropharynx is clear. Uvula midline. No oropharyngeal exudate or posterior oropharyngeal erythema.  Eyes:     General: Lids are normal. Vision grossly intact. Gaze aligned appropriately. No scleral icterus.       Right eye: No discharge.        Left eye: No discharge.     Extraocular Movements: Extraocular movements intact.     Conjunctiva/sclera: Conjunctivae normal.     Pupils: Pupils are equal, round, and reactive to light.     Funduscopic exam:    Right eye: Red reflex present.        Left eye: Red reflex present. Neck:     Thyroid : No thyromegaly.     Vascular: No carotid bruit or JVD.     Trachea: No tracheal deviation.  Cardiovascular:     Rate and Rhythm: Normal rate and regular rhythm.     Pulses: Normal pulses.     Heart sounds: Normal heart sounds, S1 normal and S2 normal. No murmur heard.    No friction rub. No gallop.  Pulmonary:     Effort: Pulmonary effort is normal. No accessory muscle usage or respiratory distress.     Breath sounds: Normal breath sounds and air entry. No stridor. No wheezing or rales.  Chest:     Chest wall: No tenderness.     Comments: Declined clinical breast exam, patient sees OB/GYN and also  had a mammogram recently. Abdominal:     General: Bowel sounds are normal. There is no distension.     Palpations: Abdomen is soft. There is no shifting dullness, fluid wave, mass or pulsatile mass.     Tenderness: There is no abdominal tenderness. There is no guarding or rebound.  Musculoskeletal:        General: No tenderness or deformity. Normal range of motion.     Cervical back: Normal range of motion and neck supple.     Right lower leg: 1+ Pitting Edema present.     Left lower leg: 1+ Pitting Edema present.  Lymphadenopathy:     Cervical: No cervical adenopathy.  Skin:    General: Skin is warm and dry.     Capillary Refill: Capillary refill takes less than 2 seconds.     Coloration: Skin is not pale.     Findings: No erythema or rash.  Neurological:     Mental Status: She is alert and oriented to person, place, and time.     Cranial Nerves: No cranial nerve deficit.     Motor: No abnormal muscle tone.     Coordination: Coordination normal.     Gait: Gait normal.     Deep Tendon Reflexes: Reflexes are normal and symmetric.  Psychiatric:        Mood and Affect: Mood normal.        Behavior: Behavior normal. Behavior is cooperative.        Thought Content: Thought content normal.        Judgment: Judgment normal.        Assessment/Plan: 1. Encounter for routine adult health examination with abnormal findings (Primary) Age-appropriate preventive screenings and vaccinations discussed, annual physical exam completed. Routine labs for health maintenance ordered, see below. PHM updated.   - CBC with Differential/Platelet - CMP14+EGFR - Lipid Profile - Vitamin D  (25 hydroxy) - B12 and Folate Panel  2.  Type 2 diabetes mellitus with other specified complication, without long-term current use of insulin  (HCC) A1c is improved and stable. Routine labs ordered. Continue jardiance  and mounjaro as prescribed.  - POCT glycosylated hemoglobin (Hb A1C) - Urine Microalbumin w/creat.  ratio - CBC with Differential/Platelet - CMP14+EGFR - Lipid Profile - Vitamin D  (25 hydroxy) - B12 and Folate Panel - empagliflozin  (JARDIANCE ) 25 MG TABS tablet; Take 1 tablet (25 mg total) by mouth daily before breakfast.  Dispense: 30 tablet; Refill: 5 - tirzepatide (MOUNJARO) 5 MG/0.5ML Pen; Inject 5 mg into the skin once a week.  Dispense: 2 mL; Refill: 5  3. Hypertension associated with diabetes (HCC) Stable, continue benazepril  as prescribed. Routine labs ordered  - CBC with Differential/Platelet - CMP14+EGFR - Lipid Profile - Vitamin D  (25 hydroxy) - B12 and Folate Panel - benazepril  (LOTENSIN ) 20 MG tablet; Take 1 tablet (20 mg total) by mouth daily.  Dispense: 90 tablet; Refill: 3  4. Hyperlipidemia associated with type 2 diabetes mellitus (HCC) Routine labs ordered. Continue rosuvastatin  as prescribed.  - CBC with Differential/Platelet - CMP14+EGFR - Lipid Profile - Vitamin D  (25 hydroxy) - B12 and Folate Panel - rosuvastatin  (CRESTOR ) 5 MG tablet; Take 1 tablet (5 mg total) by mouth daily.  Dispense: 90 tablet; Refill: 3  5. Localized swelling, mass or lump of neck Ultrasound of neck ordered  - US  Soft Tissue Head/Neck (NON-THYROID ); Future  6. Primary localized osteoarthrosis Prednisone  taper prescribed. Continue prn tramadol  as prescribed. Follow up in 3 months for additional refills.  - traMADol  (ULTRAM ) 50 MG tablet; Take one tab po bid for pain prn only  Dispense: 180 tablet; Refill: 0 - predniSONE  (STERAPRED UNI-PAK 21 TAB) 10 MG (21) TBPK tablet; Use as directed for 6 days  Dispense: 21 tablet; Refill: 0  7. B12 deficiency Routine lab ordered  - CBC with Differential/Platelet - B12 and Folate Panel  8. Vitamin D  deficiency Routine lab ordered  - Vitamin D  (25 hydroxy)  9. Encounter for screening mammogram for malignant neoplasm of breast Routine mammogram as ordered  - MM 3D SCREENING MAMMOGRAM BILATERAL BREAST; Future  10. Generalized anxiety  disorder Continue prn alprazolam  as prescribed. Follow up in 3 months for additional refills.  - ALPRAZolam  (XANAX ) 0.5 MG tablet; Take half to one tab as needed a day ( 3 months supply  Dispense: 90 tablet; Refill: 0     General Counseling: Kristen Sparks verbalizes understanding of the findings of todays visit and agrees with plan of treatment. I have discussed any further diagnostic evaluation that may be needed or ordered today. We also reviewed her medications today. she has been encouraged to call the office with any questions or concerns that should arise related to todays visit.    Orders Placed This Encounter  Procedures   MM 3D SCREENING MAMMOGRAM BILATERAL BREAST   US  Soft Tissue Head/Neck (NON-THYROID )   Urine Microalbumin w/creat. ratio   CBC with Differential/Platelet   CMP14+EGFR   Lipid Profile   Vitamin D  (25 hydroxy)   B12 and Folate Panel   POCT glycosylated hemoglobin (Hb A1C)    Meds ordered this encounter  Medications   rosuvastatin  (CRESTOR ) 5 MG tablet    Sig: Take 1 tablet (5 mg total) by mouth daily.    Dispense:  90 tablet    Refill:  3   empagliflozin  (JARDIANCE ) 25 MG TABS tablet    Sig: Take 1 tablet (25 mg total) by mouth daily before breakfast.    Dispense:  30  tablet    Refill:  5   benazepril  (LOTENSIN ) 20 MG tablet    Sig: Take 1 tablet (20 mg total) by mouth daily.    Dispense:  90 tablet    Refill:  3   ALPRAZolam  (XANAX ) 0.5 MG tablet    Sig: Take half to one tab as needed a day ( 3 months supply    Dispense:  90 tablet    Refill:  0    90 day supply.   tirzepatide (MOUNJARO) 5 MG/0.5ML Pen    Sig: Inject 5 mg into the skin once a week.    Dispense:  2 mL    Refill:  5    Dx code E11.65, please send prior auth asap if required.   traMADol  (ULTRAM ) 50 MG tablet    Sig: Take one tab po bid for pain prn only    Dispense:  180 tablet    Refill:  0    90 day supply please.    Return in about 4 weeks (around 12/20/2023) for F/U, Review  labs/test, Silvana Holecek PCP labs and ultrasound of neck.   Total time spent:30 Minutes Time spent includes review of chart, medications, test results, and follow up plan with the patient.   [place] Controlled Substance Database was reviewed by me.  This patient was seen by Laurence Pons, FNP-C in collaboration with Dr. Verneta Gone as a part of collaborative care agreement.  Nicolas Sisler R. Bobbi Burow, MSN, FNP-C Internal medicine

## 2023-11-23 LAB — MICROALBUMIN / CREATININE URINE RATIO
Creatinine, Urine: 79.2 mg/dL
Microalb/Creat Ratio: 15 mg/g{creat} (ref 0–29)
Microalbumin, Urine: 11.5 ug/mL

## 2023-12-05 ENCOUNTER — Ambulatory Visit
Admission: RE | Admit: 2023-12-05 | Discharge: 2023-12-05 | Disposition: A | Discharge status: Home / Self Care | Source: Ambulatory Visit | Attending: Nurse Practitioner | Admitting: Nurse Practitioner

## 2023-12-05 DIAGNOSIS — E785 Hyperlipidemia, unspecified: Secondary | ICD-10-CM | POA: Diagnosis not present

## 2023-12-05 DIAGNOSIS — R221 Localized swelling, mass and lump, neck: Secondary | ICD-10-CM

## 2023-12-05 DIAGNOSIS — E559 Vitamin D deficiency, unspecified: Secondary | ICD-10-CM | POA: Diagnosis not present

## 2023-12-05 DIAGNOSIS — E1159 Type 2 diabetes mellitus with other circulatory complications: Secondary | ICD-10-CM | POA: Diagnosis not present

## 2023-12-05 DIAGNOSIS — Z0001 Encounter for general adult medical examination with abnormal findings: Secondary | ICD-10-CM | POA: Diagnosis not present

## 2023-12-05 DIAGNOSIS — I152 Hypertension secondary to endocrine disorders: Secondary | ICD-10-CM | POA: Diagnosis not present

## 2023-12-05 DIAGNOSIS — E538 Deficiency of other specified B group vitamins: Secondary | ICD-10-CM | POA: Diagnosis not present

## 2023-12-05 DIAGNOSIS — E1169 Type 2 diabetes mellitus with other specified complication: Secondary | ICD-10-CM | POA: Diagnosis not present

## 2023-12-09 ENCOUNTER — Encounter: Payer: Self-pay | Admitting: Nurse Practitioner

## 2023-12-09 ENCOUNTER — Telehealth: Payer: Self-pay

## 2023-12-09 DIAGNOSIS — M1991 Primary osteoarthritis, unspecified site: Secondary | ICD-10-CM

## 2023-12-10 MED ORDER — PREDNISONE 10 MG (21) PO TBPK
ORAL_TABLET | ORAL | 0 refills | Status: DC
Start: 2023-12-10 — End: 2024-03-26

## 2023-12-10 NOTE — Telephone Encounter (Signed)
 done

## 2023-12-17 DIAGNOSIS — L282 Other prurigo: Secondary | ICD-10-CM | POA: Diagnosis not present

## 2023-12-17 DIAGNOSIS — H6502 Acute serous otitis media, left ear: Secondary | ICD-10-CM | POA: Diagnosis not present

## 2023-12-17 DIAGNOSIS — L559 Sunburn, unspecified: Secondary | ICD-10-CM | POA: Diagnosis not present

## 2023-12-17 DIAGNOSIS — R490 Dysphonia: Secondary | ICD-10-CM | POA: Diagnosis not present

## 2023-12-20 ENCOUNTER — Ambulatory Visit: Admitting: Nurse Practitioner

## 2023-12-31 ENCOUNTER — Ambulatory Visit: Admitting: Nurse Practitioner

## 2024-01-02 ENCOUNTER — Encounter: Payer: Self-pay | Admitting: Nurse Practitioner

## 2024-01-03 ENCOUNTER — Ambulatory Visit: Payer: Self-pay | Admitting: Nurse Practitioner

## 2024-01-03 ENCOUNTER — Encounter: Payer: Self-pay | Admitting: Nurse Practitioner

## 2024-01-05 ENCOUNTER — Encounter: Payer: Self-pay | Admitting: Nurse Practitioner

## 2024-01-05 NOTE — Progress Notes (Signed)
 CBC is normal, except for hematocrit being elevated by only 0.1. this is ok, not clinically significant or concerning.   Glucose is elevated but improved from last years results. 119 is ok  Kidney function is normal, electrolytes are normal. Liver enzymes are normal, alkaline phosphatase is slightly elevated but this is ok, not concerning at that level. It would need to be significantly higher.   Cholesterol panel  -- LDL is moderately elevated but triglycerides have dropped back down to normal range. HDL has increased slightly which is great since this is the good cholesterol. Total cholesterol is elevated but this will come down as the LDL level comes down.   Vitamin D  level is normal  Vitamin B 12 is improved to normal. Folate is normal

## 2024-01-08 ENCOUNTER — Encounter: Payer: Self-pay | Admitting: Nurse Practitioner

## 2024-01-23 ENCOUNTER — Ambulatory Visit: Admitting: Nurse Practitioner

## 2024-01-28 ENCOUNTER — Ambulatory Visit (INDEPENDENT_AMBULATORY_CARE_PROVIDER_SITE_OTHER): Admitting: Nurse Practitioner

## 2024-01-28 ENCOUNTER — Encounter: Payer: Self-pay | Admitting: Nurse Practitioner

## 2024-01-28 VITALS — BP 120/66 | HR 70 | Temp 97.1°F | Resp 16 | Ht 63.0 in | Wt 193.0 lb

## 2024-01-28 DIAGNOSIS — J3089 Other allergic rhinitis: Secondary | ICD-10-CM

## 2024-01-28 DIAGNOSIS — M542 Cervicalgia: Secondary | ICD-10-CM

## 2024-01-28 DIAGNOSIS — G3184 Mild cognitive impairment, so stated: Secondary | ICD-10-CM

## 2024-01-28 DIAGNOSIS — M25511 Pain in right shoulder: Secondary | ICD-10-CM

## 2024-01-28 DIAGNOSIS — E785 Hyperlipidemia, unspecified: Secondary | ICD-10-CM

## 2024-01-28 DIAGNOSIS — H538 Other visual disturbances: Secondary | ICD-10-CM | POA: Diagnosis not present

## 2024-01-28 DIAGNOSIS — E1169 Type 2 diabetes mellitus with other specified complication: Secondary | ICD-10-CM

## 2024-01-28 DIAGNOSIS — M25512 Pain in left shoulder: Secondary | ICD-10-CM | POA: Diagnosis not present

## 2024-01-28 MED ORDER — ROSUVASTATIN CALCIUM 5 MG PO TABS: 5.0000 mg | ORAL_TABLET | Freq: Every day | ORAL | 3 refills | Status: DC

## 2024-01-28 MED ORDER — LEVOCETIRIZINE DIHYDROCHLORIDE 5 MG PO TABS
5.0000 mg | ORAL_TABLET | Freq: Every evening | ORAL | 5 refills | Status: DC
Start: 2024-01-28 — End: 2024-04-15

## 2024-01-28 MED ORDER — MELOXICAM 15 MG PO TABS: 15.0000 mg | ORAL_TABLET | Freq: Every day | ORAL | 2 refills | Status: DC

## 2024-01-28 NOTE — Progress Notes (Signed)
 [facility] [address] [place], [place] [address]  Internal MEDICINE  Office Visit Note  Patient Name: Kristen Sparks  [id number]  [id number]  Date of Service: 01/28/2024  Chief Complaint  Patient presents with   Acute Visit    Neck and shoulder pain.      HPI Kristen Sparks presents for an acute sick visit for bilateral shoulder pain and neck pain, blurry vision and memory issues.  Right shoulder pain started a couple of weeks ago. Can lift her arm above her head without issue but the pain radiates to the right side of her neck Left shoulder pain has been more chronic, ROM is decreased.  Blurry vision -- this has been an ongoing issue since last year. She noticed it when she started using mounjaro . She was initially seen by her eye doctor and was informed that her eyes are good and there is no diabetic retinopathy. She reports that the blurry vision has gotten worse while she has been on mounjaro  for the past several months.  Memory issues -- reports forgetting what to do at work, forgetting where she is going when she is supposed to be driving to her mother's house. Increased forgetfulness at home. Reports history of having to see neurology throughout her childhood for issues with severe headaches.    Current Medication:  Outpatient Encounter Medications as of 01/28/2024  Medication Sig   levocetirizine (XYZAL ) 5 MG tablet Take 1 tablet (5 mg total) by mouth every evening.   meloxicam  (MOBIC ) 15 MG tablet Take 1 tablet (15 mg total) by mouth daily.   ALPRAZolam  (XANAX ) 0.5 MG tablet Take half to one tab as needed a day ( 3 months supply   benazepril  (LOTENSIN ) 20 MG tablet Take 1 tablet (20 mg total) by mouth daily.   empagliflozin  (JARDIANCE ) 25 MG TABS tablet Take 1 tablet (25 mg total) by mouth daily before breakfast.   Estradiol  10 MCG TABS vaginal tablet INSERT 1 TABLET(10 MCG) VAGINALLY 2 TIMES A WEEK. START AFTER COMPLETION OF THE DAILY DOSE FOR 14 DAYS   glucose blood  (CONTOUR TEST) test strip Use 1 test strip once daily to check blood sugar for diabetes E11.65   Microlet Lancets MISC Use 1 lancet once daily to check blood sugar for diabetes E11.65   nystatin  cream (MYCOSTATIN ) Apply 1 application topically 2 (two) times daily.   omeprazole  (PRILOSEC) 40 MG capsule Take 1 capsule (40 mg total) by mouth 2 (two) times daily.   predniSONE  (STERAPRED UNI-PAK 21 TAB) 10 MG (21) TBPK tablet Use as directed for 6 days   rosuvastatin  (CRESTOR ) 5 MG tablet Take 1 tablet (5 mg total) by mouth daily.   sertraline  (ZOLOFT ) 100 MG tablet TAKE 1 TABLET(100 MG) BY MOUTH DAILY   tirzepatide  (MOUNJARO ) 5 MG/0.5ML Pen Inject 5 mg into the skin once a week.   traMADol  (ULTRAM ) 50 MG tablet Take one tab po bid for pain prn only   [DISCONTINUED] rosuvastatin  (CRESTOR ) 5 MG tablet Take 1 tablet (5 mg total) by mouth daily.   No facility-administered encounter medications on file as of 01/28/2024.      Medical History: Past Medical History:  Diagnosis Date   Anxiety    Asthma    Depression    Environmental allergies    Hyperlipidemia    Hypertension    Rheumatoid arthritis (HCC)    Sleep apnea 2019   Borderline   Type 2 diabetes mellitus (HCC)      Vital Signs: BP  120/66   Pulse 70   Temp (!) 97.1 F (36.2 C)   Resp 16   Ht 5' 3 (1.6 m)   Wt 193 lb (87.5 kg)   SpO2 98%   BMI 34.19 kg/m    Review of Systems  Constitutional:  Positive for fatigue.  Eyes:  Positive for visual disturbance (blurry vision).  Respiratory:  Negative for cough, chest tightness, shortness of breath and wheezing.   Cardiovascular: Negative.  Negative for chest pain and palpitations.  Musculoskeletal:  Positive for arthralgias, myalgias and neck pain.  Neurological:  Positive for headaches.  Psychiatric/Behavioral:  Positive for confusion (increased forgetfulness and forgetting what she is doing or where she is going.) and decreased concentration.     Physical Exam Vitals  reviewed.  Constitutional:      General: She is not in acute distress.    Appearance: Normal appearance. She is obese. She is not ill-appearing.  HENT:     Head: Normocephalic and atraumatic.  Eyes:     Pupils: Pupils are equal, round, and reactive to light.  Cardiovascular:     Rate and Rhythm: Normal rate and regular rhythm.  Pulmonary:     Effort: Pulmonary effort is normal. No respiratory distress.  Skin:    General: Skin is warm and dry.     Capillary Refill: Capillary refill takes less than 2 seconds.  Neurological:     Mental Status: She is alert and oriented to person, place, and time.  Psychiatric:        Attention and Perception: She is inattentive.        Mood and Affect: Mood is anxious. Affect is tearful.        Behavior: Behavior is cooperative.        Cognition and Memory: Memory is impaired. She exhibits impaired recent memory and impaired remote memory.       Assessment/Plan: 1. Acute pain of right shoulder (Primary) Referred to orthopedic surgery. Meloxicam  prescribed to help with pain.  - Ambulatory referral to Orthopedic Surgery - meloxicam  (MOBIC ) 15 MG tablet; Take 1 tablet (15 mg total) by mouth daily.  Dispense: 30 tablet; Refill: 2  2. Acute pain of left shoulder Referred to orthopedic surgery. Meloxicam  prescribed to help with pain.  - Ambulatory referral to Orthopedic Surgery - meloxicam  (MOBIC ) 15 MG tablet; Take 1 tablet (15 mg total) by mouth daily.  Dispense: 30 tablet; Refill: 2  3. Acute neck pain Referred to orthopedic surgery. Meloxicam  prescribed to help with pain.  - Ambulatory referral to Orthopedic Surgery - meloxicam  (MOBIC ) 15 MG tablet; Take 1 tablet (15 mg total) by mouth daily.  Dispense: 30 tablet; Refill: 2  4. Blurry vision, bilateral Noted that she is still having blurry vision. Discussed switching from mounjaro  to ozempic  to see if this improves the blurry vision. Patient is unsure at this time.   5. MCI (mild cognitive  impairment) with memory loss Discussed neurology referral but patient is unsure at this time.   6. Non-seasonal allergic rhinitis due to other allergic trigger Try levocetirizine as prescribed.  - levocetirizine (XYZAL ) 5 MG tablet; Take 1 tablet (5 mg total) by mouth every evening.  Dispense: 30 tablet; Refill: 5  7. Hyperlipidemia associated with type 2 diabetes mellitus (HCC) Continue rosuvastatin  as prescribed.  - rosuvastatin  (CRESTOR ) 5 MG tablet; Take 1 tablet (5 mg total) by mouth daily.  Dispense: 90 tablet; Refill: 3  8. Type 2 diabetes mellitus with other specified complication, without long-term current use  of insulin  [facility]) Patient is currently on mounjaro  but we have discussed switching to ozempic , she is considering this but has not decided yet.   General Counseling: Odessia verbalizes understanding of the findings of todays visit and agrees with plan of treatment. I have discussed any further diagnostic evaluation that may be needed or ordered today. We also reviewed her medications today. she has been encouraged to call the office with any questions or concerns that should arise related to todays visit.    Counseling:    Orders Placed This Encounter  Procedures   Ambulatory referral to Orthopedic Surgery    Meds ordered this encounter  Medications   rosuvastatin  (CRESTOR ) 5 MG tablet    Sig: Take 1 tablet (5 mg total) by mouth daily.    Dispense:  90 tablet    Refill:  3   levocetirizine (XYZAL ) 5 MG tablet    Sig: Take 1 tablet (5 mg total) by mouth every evening.    Dispense:  30 tablet    Refill:  5   meloxicam  (MOBIC ) 15 MG tablet    Sig: Take 1 tablet (15 mg total) by mouth daily.    Dispense:  30 tablet    Refill:  2    Fill new script today    Return if symptoms worsen or fail to improve, for keep regular scheduled f/u. referrred to ortho.  [place] Controlled Substance Database was reviewed by me for overdose risk score (ORS)  Time spent:30  Minutes Time spent with patient included reviewing progress notes, labs, imaging studies, and discussing plan for follow up.   This patient was seen by Mardy Maxin, FNP-C in collaboration with Dr. Sigrid Bathe as a part of collaborative care agreement.  Ryann Pauli R. Maxin, MSN, FNP-C Internal Medicine

## 2024-01-29 ENCOUNTER — Telehealth: Payer: Self-pay | Admitting: Nurse Practitioner

## 2024-01-29 ENCOUNTER — Other Ambulatory Visit: Payer: Self-pay | Admitting: Nurse Practitioner

## 2024-01-29 DIAGNOSIS — K2100 Gastro-esophageal reflux disease with esophagitis, without bleeding: Secondary | ICD-10-CM

## 2024-01-29 NOTE — Telephone Encounter (Signed)
 Awaiting 01/28/24 office notes for Orthopedic referral-[place]

## 2024-01-30 DIAGNOSIS — H25013 Cortical age-related cataract, bilateral: Secondary | ICD-10-CM | POA: Diagnosis not present

## 2024-01-30 DIAGNOSIS — H353121 Nonexudative age-related macular degeneration, left eye, early dry stage: Secondary | ICD-10-CM | POA: Diagnosis not present

## 2024-01-30 DIAGNOSIS — H353112 Nonexudative age-related macular degeneration, right eye, intermediate dry stage: Secondary | ICD-10-CM | POA: Diagnosis not present

## 2024-01-30 DIAGNOSIS — E119 Type 2 diabetes mellitus without complications: Secondary | ICD-10-CM | POA: Diagnosis not present

## 2024-01-30 LAB — HM DIABETES EYE EXAM

## 2024-01-31 ENCOUNTER — Telehealth: Payer: Self-pay

## 2024-02-04 MED ORDER — OZEMPIC (0.25 OR 0.5 MG/DOSE) 2 MG/3ML ~~LOC~~ SOPN
0.5000 mg | PEN_INJECTOR | SUBCUTANEOUS | 1 refills | Status: DC
Start: 2024-02-04 — End: 2024-05-05

## 2024-02-04 NOTE — Telephone Encounter (Signed)
 Lmom that we sent ozempic 

## 2024-02-11 ENCOUNTER — Encounter: Payer: Self-pay | Admitting: Nurse Practitioner

## 2024-02-11 ENCOUNTER — Telehealth: Payer: Self-pay | Admitting: Nurse Practitioner

## 2024-02-11 NOTE — Telephone Encounter (Signed)
 Urgent Orthopedic referral sent via [facility] to [facility]. Lvm notifying patient. Gave telephone # [phone]

## 2024-02-18 ENCOUNTER — Telehealth: Payer: Self-pay

## 2024-02-18 NOTE — Telephone Encounter (Signed)
 Spoke patient to let her know she is approved for Ozempic  til 02-04-25

## 2024-02-20 DIAGNOSIS — M542 Cervicalgia: Secondary | ICD-10-CM | POA: Diagnosis not present

## 2024-02-27 ENCOUNTER — Telehealth: Payer: Self-pay | Admitting: Nurse Practitioner

## 2024-02-27 NOTE — Telephone Encounter (Signed)
 Orthopedic appointment was 02/20/2024 @ [facility]-[place]

## 2024-03-26 ENCOUNTER — Telehealth: Payer: Self-pay

## 2024-03-26 ENCOUNTER — Other Ambulatory Visit: Payer: Self-pay

## 2024-03-26 ENCOUNTER — Other Ambulatory Visit: Payer: Self-pay | Admitting: Nurse Practitioner

## 2024-03-26 DIAGNOSIS — M1991 Primary osteoarthritis, unspecified site: Secondary | ICD-10-CM

## 2024-03-26 DIAGNOSIS — J22 Unspecified acute lower respiratory infection: Secondary | ICD-10-CM

## 2024-03-26 DIAGNOSIS — T3695XA Adverse effect of unspecified systemic antibiotic, initial encounter: Secondary | ICD-10-CM

## 2024-03-26 MED ORDER — PREDNISONE 10 MG (21) PO TBPK: ORAL_TABLET | ORAL | 0 refills | Status: DC

## 2024-03-26 NOTE — Telephone Encounter (Signed)
 Pt advised that we sent prednisone  and advised her that been diabetic she need watch her diet because prednisone  make your glucose high

## 2024-03-27 ENCOUNTER — Other Ambulatory Visit: Payer: Self-pay | Admitting: Nurse Practitioner

## 2024-03-27 DIAGNOSIS — T3695XA Adverse effect of unspecified systemic antibiotic, initial encounter: Secondary | ICD-10-CM

## 2024-03-31 ENCOUNTER — Ambulatory Visit (INDEPENDENT_AMBULATORY_CARE_PROVIDER_SITE_OTHER): Admitting: Nurse Practitioner

## 2024-03-31 ENCOUNTER — Encounter: Payer: Self-pay | Admitting: Nurse Practitioner

## 2024-03-31 VITALS — BP 131/75 | HR 77 | Temp 96.4°F | Resp 16 | Ht 63.0 in | Wt 199.6 lb

## 2024-03-31 DIAGNOSIS — L239 Allergic contact dermatitis, unspecified cause: Secondary | ICD-10-CM

## 2024-03-31 MED ORDER — HYDROXYZINE HCL 10 MG PO TABS: 10.0000 mg | ORAL_TABLET | Freq: Three times a day (TID) | ORAL | 0 refills | Status: DC | PRN

## 2024-03-31 MED ORDER — CLOBETASOL PROPIONATE 0.05 % EX CREA: 1.0000 | TOPICAL_CREAM | Freq: Two times a day (BID) | CUTANEOUS | 2 refills | Status: AC

## 2024-03-31 NOTE — Progress Notes (Signed)
 [facility] [address] [place], [place] [address]  Internal MEDICINE  Office Visit Note  Patient Name: Kristen Sparks  [id number]  [id number]  Date of Service: 03/31/2024  Chief Complaint  Patient presents with   Acute Visit    Rash around waist     HPI Perlita presents for an acute sick visit for rash Onset of rash was on abdomen with little red dots, prednisone  did not clear it up Patient said it is itchy but not burning.  Reports that they were initially blisters then popped and crusted over, now drying up, has been there for about 1 week.     Current Medication:  Outpatient Encounter Medications as of 03/31/2024  Medication Sig   clobetasol cream (TEMOVATE) 0.05 % Apply 1 Application topically 2 (two) times daily. To rash in affected areas until resolved.   hydrOXYzine (ATARAX) 10 MG tablet Take 1 tablet (10 mg total) by mouth 3 (three) times daily as needed for itching.   benazepril  (LOTENSIN ) 20 MG tablet Take 1 tablet (20 mg total) by mouth daily.   empagliflozin  (JARDIANCE ) 25 MG TABS tablet Take 1 tablet (25 mg total) by mouth daily before breakfast.   fluconazole  (DIFLUCAN ) 150 MG tablet TAKE 1 TABLET(150 MG) BY MOUTH 1 TIME FOR 1 DOSE. MAY TAKE AN ADDITIONAL DOSE AFTER 3 DAYS IF STILL SYMPTOMATIC   glucose blood (CONTOUR TEST) test strip Use 1 test strip once daily to check blood sugar for diabetes E11.65 (Patient not taking: Reported on 04/15/2024)   levofloxacin  (LEVAQUIN ) 750 MG tablet TAKE 1 TABLET(750 MG) BY MOUTH DAILY (Patient not taking: Reported on 04/15/2024)   meloxicam  (MOBIC ) 15 MG tablet Take 1 tablet (15 mg total) by mouth daily.   omeprazole  (PRILOSEC) 40 MG capsule TAKE 1 CAPSULE(40 MG) BY MOUTH TWICE DAILY   Semaglutide ,0.25 or 0.5MG /DOS, (OZEMPIC , 0.25 OR 0.5 MG/DOSE,) 2 MG/3ML SOPN Inject 0.5 mg into the skin once a week. (Patient not taking: Reported on 04/15/2024)   sertraline  (ZOLOFT ) 100 MG tablet TAKE 1 TABLET(100 MG) BY MOUTH DAILY    traMADol  (ULTRAM ) 50 MG tablet TAKE 1 TABLET BY MOUTH TWICE DAILY AS NEEDED FOR PAIN   [DISCONTINUED] ALPRAZolam  (XANAX ) 0.5 MG tablet Take half to one tab as needed a day ( 3 months supply   [DISCONTINUED] Estradiol  10 MCG TABS vaginal tablet INSERT 1 TABLET(10 MCG) VAGINALLY 2 TIMES A WEEK. START AFTER COMPLETION OF THE DAILY DOSE FOR 14 DAYS (Patient not taking: Reported on 04/15/2024)   [DISCONTINUED] levocetirizine (XYZAL ) 5 MG tablet Take 1 tablet (5 mg total) by mouth every evening. (Patient not taking: Reported on 04/15/2024)   [DISCONTINUED] Microlet Lancets MISC Use 1 lancet once daily to check blood sugar for diabetes E11.65 (Patient not taking: Reported on 04/15/2024)   [DISCONTINUED] nystatin  cream (MYCOSTATIN ) Apply 1 application topically 2 (two) times daily. (Patient not taking: Reported on 04/15/2024)   [DISCONTINUED] predniSONE  (STERAPRED UNI-PAK 21 TAB) 10 MG (21) TBPK tablet Use as directed for 6 days (Patient not taking: Reported on 04/15/2024)   [DISCONTINUED] rosuvastatin  (CRESTOR ) 5 MG tablet Take 1 tablet (5 mg total) by mouth daily. (Patient not taking: Reported on 04/15/2024)   No facility-administered encounter medications on file as of 03/31/2024.      Medical History: Past Medical History:  Diagnosis Date   Anxiety    Asthma    Depression    Environmental allergies    Hyperlipidemia    Hypertension    Rheumatoid arthritis (HCC)  Sleep apnea 2019   Borderline   Type 2 diabetes mellitus (HCC)      Vital Signs: BP 131/75   Pulse 77   Temp (!) 96.4 F (35.8 C)   Resp 16   Ht 5' 3 (1.6 m)   Wt 199 lb 9.6 oz (90.5 kg)   SpO2 97%   BMI 35.36 kg/m    Review of Systems  Constitutional: Negative.  Negative for chills, fatigue and fever.  Respiratory: Negative.  Negative for cough, chest tightness, shortness of breath and wheezing.   Cardiovascular: Negative.  Negative for chest pain and palpitations.  Skin:  Positive for rash (around  waist/abdomen).  Neurological: Negative.   Psychiatric/Behavioral:  Negative for behavioral problems, self-injury, sleep disturbance and suicidal ideas. The patient is nervous/anxious.     Physical Exam Vitals reviewed.  Constitutional:      Appearance: Normal appearance. She is obese. She is not ill-appearing.  HENT:     Head: Normocephalic and atraumatic.  Eyes:     Pupils: Pupils are equal, round, and reactive to light.  Cardiovascular:     Rate and Rhythm: Normal rate and regular rhythm.  Pulmonary:     Effort: Pulmonary effort is normal. No respiratory distress.  Skin:    General: Skin is warm and dry.     Findings: Rash present. Rash is crusting and vesicular (itchy blisters around waist/abdomen area).  Neurological:     Mental Status: She is alert and oriented to person, place, and time.  Psychiatric:        Mood and Affect: Mood normal.        Behavior: Behavior normal.       Assessment/Plan: 1. Allergic contact dermatitis, unspecified trigger (Primary) Topical steroid and oral hydroxyzine prescribed. If no improvement after a couple of weeks, may need referral to dermatology for further evaluation.  - clobetasol cream (TEMOVATE) 0.05 %; Apply 1 Application topically 2 (two) times daily. To rash in affected areas until resolved.  Dispense: 45 g; Refill: 2 - hydrOXYzine (ATARAX) 10 MG tablet; Take 1 tablet (10 mg total) by mouth 3 (three) times daily as needed for itching.  Dispense: 30 tablet; Refill: 0   General Counseling: Keshonda verbalizes understanding of the findings of todays visit and agrees with plan of treatment. I have discussed any further diagnostic evaluation that may be needed or ordered today. We also reviewed her medications today. she has been encouraged to call the office with any questions or concerns that should arise related to todays visit.    Counseling:    No orders of the defined types were placed in this encounter.   Meds ordered this  encounter  Medications   clobetasol cream (TEMOVATE) 0.05 %    Sig: Apply 1 Application topically 2 (two) times daily. To rash in affected areas until resolved.    Dispense:  45 g    Refill:  2   hydrOXYzine (ATARAX) 10 MG tablet    Sig: Take 1 tablet (10 mg total) by mouth 3 (three) times daily as needed for itching.    Dispense:  30 tablet    Refill:  0    Fill new script today.    Return if symptoms worsen or fail to improve.  [place] Controlled Substance Database was reviewed by me for overdose risk score (ORS)  Time spent:20 Minutes Time spent with patient included reviewing progress notes, labs, imaging studies, and discussing plan for follow up.   This patient was seen by Sarit Sparano  Linea Calles, FNP-C in collaboration with Dr. Sigrid Bathe as a part of collaborative care agreement.  Katherin Ramey R. Liana, MSN, FNP-C Internal Medicine

## 2024-04-14 ENCOUNTER — Other Ambulatory Visit: Payer: Self-pay

## 2024-04-15 ENCOUNTER — Ambulatory Visit

## 2024-04-15 VITALS — BP 154/90 | HR 71 | Ht 63.0 in | Wt 200.2 lb

## 2024-04-15 DIAGNOSIS — E785 Hyperlipidemia, unspecified: Secondary | ICD-10-CM | POA: Diagnosis not present

## 2024-04-15 DIAGNOSIS — E1169 Type 2 diabetes mellitus with other specified complication: Secondary | ICD-10-CM

## 2024-04-15 DIAGNOSIS — Z860100 Personal history of colon polyps, unspecified: Secondary | ICD-10-CM | POA: Diagnosis not present

## 2024-04-15 DIAGNOSIS — G4709 Other insomnia: Secondary | ICD-10-CM

## 2024-04-15 DIAGNOSIS — G4700 Insomnia, unspecified: Secondary | ICD-10-CM | POA: Insufficient documentation

## 2024-04-15 DIAGNOSIS — E119 Type 2 diabetes mellitus without complications: Secondary | ICD-10-CM

## 2024-04-15 DIAGNOSIS — R221 Localized swelling, mass and lump, neck: Secondary | ICD-10-CM

## 2024-04-15 MED ORDER — ROSUVASTATIN CALCIUM 10 MG PO TABS: 10.0000 mg | ORAL_TABLET | Freq: Every day | ORAL | 3 refills | Status: AC

## 2024-04-15 NOTE — Progress Notes (Signed)
 New Patient Visit   Physician: Tameron Lama A Aaliyan Brinkmeier, MD  Patient: Kristen Sparks   DOB: Jan 12, 1961   63 y.o. Female  MRN: [id number] Visit Date: 04/15/2024   Chief Complaint  Patient presents with   Establish Care   Subjective  [name] is a 63 y.o. female who presents today as a new patient to establish care.   HPI  Discussed the use of AI scribe software for clinical note transcription with the patient, who gave verbal consent to proceed.  History of Present Illness   Kristen Sparks is a 63 year old female who presents with a neck nodule and concerns about her diabetes management.  Neck mass - Relates this to ozempic  use which she was on for a short time - Ultrasound did not visualize the nodule - lateral right to trachea - seen on X-ray - No dysphagia.   Has had night sweats in the last months which are new.   Diabetes mellitus - Onset after COVID-19 infection - Currently taking Jardiance  25 mg daily - Blood glucose reportedly well controlled per previous provider.  Last HgbA1C 5.8 - Not following a diabetic diet - No history of hypoglycemia  - Has had DM eye exam this year  Hypertension - Currently taking Benazepril  20 mg - Does not monitor blood pressure at home - Blood pressure usually not elevated during office visits  Insomnia and night sweats - Difficulty initiating and maintaining sleep - No current use of sleep aids - Night sweats for the past two months, waking up wet and needing to cool down before returning to sleep  Mood disorders - History of depression and anxiety - Takes Zoloft  daily - Mood stable most days - Anxiety has improved over the years  Colonic polyps - History of polyps found during colonoscopies - Five polyps removed during most recent colonoscopy this year    Hypertension - BP in office 154/90.  Does not check BP at home  - Currently on benazepril  20 mg daily.   Obesity   - BMI 35.  She does not exercise.  Diet could be  improved  No FH CAD, Does have FH of colon ca  ASSESSMENT & PLAN  Encounter Diagnoses  Name Primary?   Type 2 diabetes mellitus without complication, without long-term current use of insulin  (HCC) Yes   Hyperlipidemia associated with type 2 diabetes mellitus (HCC)    Neck mass     Orders Placed This Encounter  Procedures   Microalbumin / creatinine urine ratio   TSH + free T4   CBC with Differential/Platelet   Comprehensive metabolic panel with GFR   Hemoglobin A1c   Lipid panel   Urinalysis, Routine w reflex microscopic   C-reactive protein    Assessment and Plan     1.  Neck mass.  There are certain pathologies that will not be visible with ultrasound imaging.  She should have a CT of the head and neck for further visualization.  This is particularly important with her family history of more rare cancers and more recent night sweats.  2.  Type 2 diabetes.  Patient to continue with Jardiance  25 mg.  Will check urine microalbumin/hemoglobin A1c.  Seems to be well-controlled.  Did give her information on diabetic diet.  Ideally she will revert to diet control only.  She should be on a statin for reduction in overall cardiovascular risk will send to pharmacy.  3.  Hypertension.  Patient to continue benazepril .  Blood pressure suboptimal in office.  Would like her to keep blood pressure log and follow-up in 3 weeks with log.  Goal for her is less than 130/80.  4.  GERD.  Continue with omeprazole  40 mg daily.  Well-controlled  5.  Anxiety and depression.  Continue with Zoloft  at current dose.  Mood overall stable  6.  Insomnia.  Recommend melatonin 3 to 5 mg before bedtime.  She try may try also magnesium glycinate.  7.  Obesity - Discussed getting some walking in despite work schedule.  Important for future mobility  Plan to complete labs and follow-up in 3 to 4 weeks Declines vaccines   Objective  BP (!) 154/90   Pulse 71   Ht 5' 3 (1.6 m)   Wt 200 lb 3.2 oz (90.8 kg)    SpO2 97%   BMI 35.46 kg/m      Review of Systems  Constitutional:  Negative for chills, fever and weight loss.  Eyes:  Negative for blurred vision. h Respiratory:  Negative for cough and shortness of breath.   Cardiovascular:  Negative for chest pain and palpitations.  Skin:  Negative for rash.  Psychiatric/Behavioral:  Negative for depression. The patient is not nervous/anxious.      Physical Exam Physical Exam Vitals reviewed.  Constitutional:      Appearance: Normal appearance. Well-developed with normal weight.  HENT:     Head: Normocephalic and atraumatic.  Normal mucous membranes, no oral lesions Eyes:     Pupils: Pupils are equal, round, and reactive to light.  Neck:     Thyroid : No thyroid  mass.  Mass palpated to the right of trachea 3+ cm, mobile firm #1 enlarged cervical lymph node. Cardiovascular:     Rate and Rhythm: Normal rate and regular rhythm. Normal heart sounds. Normal peripheral pulses Pulmonary:     Normal breath sounds with normal effort Abdominal:   Abdomen is soft, without tenderness or noted hepatosplenomegaly Musculoskeletal:        General: No swelling or edema  Lymphadenopathy:     Cervical: No cervical adenopathy.  Skin:    General: Skin is warm and dry without noticeable rash. Neurological:     General: No focal deficit present.  Psychiatric:        Mood and Affect: Mood, behavior and cognition normal   Past Medical History:  Diagnosis Date   Anxiety    Asthma    Depression    Environmental allergies    Hyperlipidemia    Hypertension    Rheumatoid arthritis (HCC)    Sleep apnea 2019   Borderline   Type 2 diabetes mellitus (HCC)    Past Surgical History:  Procedure Laterality Date   CHOLECYSTECTOMY  2000   colon polyectomy     COLONOSCOPY WITH PROPOFOL  N/A 03/31/2018   Procedure: COLONOSCOPY WITH PROPOFOL ;  Surgeon: Therisa Bi, MD;  Location[facility] ENDOSCOPY;  Service: Gastroenterology;  Laterality: N/A;   COLONOSCOPY WITH  PROPOFOL  N/A 10/02/2021   Procedure: COLONOSCOPY WITH PROPOFOL ;  Surgeon: Therisa Bi, MD;  Location: [facility] ENDOSCOPY;  Service: Gastroenterology;  Laterality: N/A;   DENTAL SURGERY     FOOT SURGERY     Family Status  Relation Name Status   Mother  Alive   Father  Deceased   Sister  Deceased   Sister  Alive   Neg Hx  (Not Specified)  No partnership data on file   Family History  Problem Relation Age of Onset   Diabetes Mother  COPD Mother    Uterine cancer Mother 56   Cancer Sister 46       Bile Duct   Stroke Sister    Breast cancer Neg Hx    Social History   Socioeconomic History   Marital status: Married    Spouse name: Not on file   Number of children: Not on file   Years of education: Not on file   Highest education level: Not on file  Occupational History   Not on file  Tobacco Use   Smoking status: Never   Smokeless tobacco: Never  Vaping Use   Vaping status: Never Used  Substance and Sexual Activity   Alcohol use: Yes    Comment: occ.   Drug use: No   Sexual activity: Yes    Birth control/protection: Post-menopausal  Other Topics Concern   Not on file  Social History Narrative   Not on file   Social Drivers of Health   Financial Resource Strain: Not on file  Food Insecurity: Not on file  Transportation Needs: Not on file  Physical Activity: Not on file  Stress: Not on file  Social Connections: Not on file   Outpatient Medications Prior to Visit  Medication Sig   benazepril  (LOTENSIN ) 20 MG tablet Take 1 tablet (20 mg total) by mouth daily.   empagliflozin  (JARDIANCE ) 25 MG TABS tablet Take 1 tablet (25 mg total) by mouth daily before breakfast.   fluconazole  (DIFLUCAN ) 150 MG tablet TAKE 1 TABLET(150 MG) BY MOUTH 1 TIME FOR 1 DOSE. MAY TAKE AN ADDITIONAL DOSE AFTER 3 DAYS IF STILL SYMPTOMATIC   omeprazole  (PRILOSEC) 40 MG capsule TAKE 1 CAPSULE(40 MG) BY MOUTH TWICE DAILY   sertraline  (ZOLOFT ) 100 MG tablet TAKE 1 TABLET(100 MG) BY MOUTH DAILY    traMADol  (ULTRAM ) 50 MG tablet TAKE 1 TABLET BY MOUTH TWICE DAILY AS NEEDED FOR PAIN   clobetasol cream (TEMOVATE) 0.05 % Apply 1 Application topically 2 (two) times daily. To rash in affected areas until resolved.   glucose blood (CONTOUR TEST) test strip Use 1 test strip once daily to check blood sugar for diabetes E11.65 (Patient not taking: Reported on 04/15/2024)   hydrOXYzine (ATARAX) 10 MG tablet Take 1 tablet (10 mg total) by mouth 3 (three) times daily as needed for itching.   levofloxacin  (LEVAQUIN ) 750 MG tablet TAKE 1 TABLET(750 MG) BY MOUTH DAILY (Patient not taking: Reported on 04/15/2024)   meloxicam  (MOBIC ) 15 MG tablet Take 1 tablet (15 mg total) by mouth daily.   Microlet Lancets MISC Use 1 lancet once daily to check blood sugar for diabetes E11.65 (Patient not taking: Reported on 04/15/2024)   Semaglutide ,0.25 or 0.5MG /DOS, (OZEMPIC , 0.25 OR 0.5 MG/DOSE,) 2 MG/3ML SOPN Inject 0.5 mg into the skin once a week. (Patient not taking: Reported on 04/15/2024)   [DISCONTINUED] ALPRAZolam  (XANAX ) 0.5 MG tablet Take half to one tab as needed a day ( 3 months supply   [DISCONTINUED] Estradiol  10 MCG TABS vaginal tablet INSERT 1 TABLET(10 MCG) VAGINALLY 2 TIMES A WEEK. START AFTER COMPLETION OF THE DAILY DOSE FOR 14 DAYS (Patient not taking: Reported on 04/15/2024)   [DISCONTINUED] levocetirizine (XYZAL ) 5 MG tablet Take 1 tablet (5 mg total) by mouth every evening. (Patient not taking: Reported on 04/15/2024)   [DISCONTINUED] nystatin  cream (MYCOSTATIN ) Apply 1 application topically 2 (two) times daily. (Patient not taking: Reported on 04/15/2024)   [DISCONTINUED] predniSONE  (STERAPRED UNI-PAK 21 TAB) 10 MG (21) TBPK tablet Use as directed for 6 days (  Patient not taking: Reported on 04/15/2024)   [DISCONTINUED] rosuvastatin  (CRESTOR ) 5 MG tablet Take 1 tablet (5 mg total) by mouth daily. (Patient not taking: Reported on 04/15/2024)   No facility-administered medications prior to visit.    Allergies  Allergen Reactions   Azithromycin Hives   Contrast Media [Iodinated Contrast Media]     Immunization History  Administered Date(s) Administered   Influenza-Unspecified 08/28/2017   Pneumococcal-Unspecified 08/28/2017    Health Maintenance  Topic Date Due   Pneumococcal Vaccine: 50+ Years (1 of 2 - PCV) 09/18/1980   HEMOGLOBIN A1C  05/24/2024   Mammogram  10/23/2024   Diabetic kidney evaluation - Urine ACR  11/21/2024   FOOT EXAM  11/21/2024   Diabetic kidney evaluation - eGFR measurement  12/04/2024   OPHTHALMOLOGY EXAM  01/29/2025   Cervical Cancer Screening (HPV/Pap Cotest)  06/27/2026   Colonoscopy  10/03/2026   HIV Screening  Completed   Hepatitis B Vaccines 19-59 Average Risk  Aged Out   HPV VACCINES  Aged Out   Meningococcal B Vaccine  Aged Out   DTaP/Tdap/Td  Discontinued   Influenza Vaccine  Discontinued   COVID-19 Vaccine  Discontinued   Hepatitis C Screening  Discontinued   Zoster Vaccines- Shingrix  Discontinued    Patient Care Team: Everlene Parris LABOR, MD as PCP - General (Family Medicine)  Depression Screen    11/22/2023   10:20 AM 11/22/2022    9:04 AM 08/02/2022    3:57 PM 05/07/2022   10:55 AM  PHQ 2/9 Scores  PHQ - 2 Score    0  Exception Documentation Medical reason Medical reason Medical reason      Parris LABOR Everlene, MD  [facility] [phone] (phone) [phone] (fax)  [facility]

## 2024-04-16 LAB — URINALYSIS, ROUTINE W REFLEX MICROSCOPIC
Bilirubin Urine: NEGATIVE
Hgb urine dipstick: NEGATIVE
Ketones, ur: NEGATIVE
Leukocytes,Ua: NEGATIVE
Nitrite: NEGATIVE
Protein, ur: NEGATIVE
Specific Gravity, Urine: 1.027 (ref 1.001–1.035)
pH: <=5 — AB (ref 5.0–8.0)

## 2024-04-16 LAB — LIPID PANEL
Cholesterol: 214 mg/dL — ABNORMAL HIGH (ref <200)
HDL: 75 mg/dL (ref >=50)
LDL Cholesterol (Calc): 120 mg/dL — ABNORMAL HIGH
Non-HDL Cholesterol (Calc): 139 mg/dL — ABNORMAL HIGH (ref <130)
Total CHOL/HDL Ratio: 2.9 (calc) (ref <5.0)
Triglycerides: 89 mg/dL (ref <150)

## 2024-04-16 LAB — CBC WITH DIFFERENTIAL/PLATELET
Absolute Lymphocytes: 2400 {cells}/uL (ref 850–3900)
Absolute Monocytes: 422 {cells}/uL (ref 200–950)
Basophils Absolute: 63 {cells}/uL (ref 0–200)
Basophils Relative: 1 %
Eosinophils Absolute: 340 {cells}/uL (ref 15–500)
Eosinophils Relative: 5.4 %
HCT: 43.3 % (ref 35.0–45.0)
Hemoglobin: 14.4 g/dL (ref 11.7–15.5)
MCH: 30.1 pg (ref 27.0–33.0)
MCHC: 33.3 g/dL (ref 32.0–36.0)
MCV: 90.4 fL (ref 80.0–100.0)
MPV: 12.6 fL — ABNORMAL HIGH (ref 7.5–12.5)
Monocytes Relative: 6.7 %
Neutro Abs: 3074 {cells}/uL (ref 1500–7800)
Neutrophils Relative %: 48.8 %
Platelets: 215 10*3/uL (ref 140–400)
RBC: 4.79 Million/uL (ref 3.80–5.10)
RDW: 13 % (ref 11.0–15.0)
Total Lymphocyte: 38.1 %
WBC: 6.3 10*3/uL (ref 3.8–10.8)

## 2024-04-16 LAB — COMPREHENSIVE METABOLIC PANEL WITH GFR
AG Ratio: 2 (calc) (ref 1.0–2.5)
ALT: 20 U/L (ref 6–29)
AST: 18 U/L (ref 10–35)
Albumin: 4.5 g/dL (ref 3.6–5.1)
Alkaline phosphatase (APISO): 103 U/L (ref 37–153)
BUN: 18 mg/dL (ref 7–25)
CO2: 27 mmol/L (ref 20–32)
Calcium: 9.5 mg/dL (ref 8.6–10.4)
Chloride: 105 mmol/L (ref 98–110)
Creat: 0.52 mg/dL (ref 0.50–1.05)
Globulin: 2.3 g/dL (ref 1.9–3.7)
Glucose, Bld: 114 mg/dL (ref 65–139)
Potassium: 4.2 mmol/L (ref 3.5–5.3)
Sodium: 141 mmol/L (ref 135–146)
Total Bilirubin: 0.4 mg/dL (ref 0.2–1.2)
Total Protein: 6.8 g/dL (ref 6.1–8.1)
eGFR: 105 mL/min/{1.73_m2}

## 2024-04-16 LAB — MICROALBUMIN / CREATININE URINE RATIO
Creatinine, Urine: 71 mg/dL (ref 20–275)
Microalb Creat Ratio: 4 mg/g{creat} (ref <30)
Microalb, Ur: 0.3 mg/dL

## 2024-04-16 LAB — TSH+FREE T4: TSH W/REFLEX TO FT4: 1.01 m[IU]/L (ref 0.40–4.50)

## 2024-04-16 LAB — HEMOGLOBIN A1C
Hgb A1c MFr Bld: 6.8 % — ABNORMAL HIGH (ref <5.7)
Mean Plasma Glucose: 148 mg/dL
eAG (mmol/L): 8.2 mmol/L

## 2024-04-16 LAB — C-REACTIVE PROTEIN: CRP: <3 mg/L

## 2024-04-20 ENCOUNTER — Ambulatory Visit: Admitting: Nurse Practitioner

## 2024-04-29 ENCOUNTER — Other Ambulatory Visit: Payer: Self-pay

## 2024-04-29 ENCOUNTER — Telehealth: Payer: Self-pay

## 2024-04-29 DIAGNOSIS — R221 Localized swelling, mass and lump, neck: Secondary | ICD-10-CM

## 2024-04-29 NOTE — Telephone Encounter (Signed)
 Copied from CRM [id number]. Topic: Referral - Question >> Apr 29, 2024  9:17 AM Eva FALCON wrote: Reason for CRM: Pt states at her last appointment she was referred to get an MRI, she has not heard anything since then, I did not see anything in her chart. Could someone look into this and reach out to patient? She mentioned the provider wanted her to have it done before her appointment on 11/12.

## 2024-05-01 ENCOUNTER — Ambulatory Visit
Admission: RE | Admit: 2024-05-01 | Discharge: 2024-05-01 | Disposition: A | Discharge status: Home / Self Care | Source: Ambulatory Visit | Attending: Nurse Practitioner | Admitting: Nurse Practitioner

## 2024-05-01 DIAGNOSIS — Z1231 Encounter for screening mammogram for malignant neoplasm of breast: Secondary | ICD-10-CM | POA: Insufficient documentation

## 2024-05-03 ENCOUNTER — Encounter: Payer: Self-pay | Admitting: Nurse Practitioner

## 2024-05-06 ENCOUNTER — Ambulatory Visit

## 2024-05-06 VITALS — BP 144/62 | HR 66 | Ht 63.0 in | Wt 206.2 lb

## 2024-05-06 DIAGNOSIS — E11A Type 2 diabetes mellitus without complications in remission: Secondary | ICD-10-CM

## 2024-05-06 NOTE — Progress Notes (Signed)
 Progress Note  Physician: Maanasa Aderhold A Reshaun Briseno, MD   HPI: Kristen Sparks is a 63 y.o. female presenting on 05/06/2024 for Follow-up .  Discussed the use of AI scribe software for clinical note transcription with the patient, who gave verbal consent to proceed.  History of Present Illness   Kristen Sparks is a 63 year old female with type 2 diabetes who presents for follow-up.  Glycemic control - Type 2 diabetes mellitus managed with Jardiance  25 mg daily - Hemoglobin A1c is 6.8%, normal renal function without albuminuria - Consumes approximately 1.5 gallons of sweet tea daily, which she acknowledges as a challenge to glycemic control  - Becomes shaky and feeling ill without sweet tea  Blood pressure variability - Hypertension managed with benazepril  20 mg daily - Blood pressure readings fluctuate significantly throughout the day - Morning blood pressure as high as 170 mmHg - Afternoon blood pressure as low as 119/73 mmHg  Neck mass - Neck mass identified at previous visit - CT scan of the neck pending for tomorrow      Medical history:  Relevant past medical, surgical, family and social history reviewed and updated as indicated. Interim medical history since our last visit reviewed.  Allergies and medications reviewed and updated.   ROS: Negative unless specifically indicated above in HPI.    Current Outpatient Medications:    benazepril  (LOTENSIN ) 20 MG tablet, Take 1 tablet (20 mg total) by mouth daily., Disp: 90 tablet, Rfl: 3   clobetasol cream (TEMOVATE) 0.05 %, Apply 1 Application topically 2 (two) times daily. To rash in affected areas until resolved., Disp: 45 g, Rfl: 2   empagliflozin  (JARDIANCE ) 25 MG TABS tablet, Take 1 tablet (25 mg total) by mouth daily before breakfast., Disp: 30 tablet, Rfl: 5   fluconazole  (DIFLUCAN ) 150 MG tablet, TAKE 1 TABLET(150 MG) BY MOUTH 1 TIME FOR 1 DOSE. MAY TAKE AN ADDITIONAL DOSE AFTER 3 DAYS IF STILL SYMPTOMATIC,  Disp: 3 tablet, Rfl: 0   meloxicam  (MOBIC ) 15 MG tablet, Take 1 tablet (15 mg total) by mouth daily., Disp: 30 tablet, Rfl: 2   omeprazole  (PRILOSEC) 40 MG capsule, TAKE 1 CAPSULE(40 MG) BY MOUTH TWICE DAILY, Disp: 180 capsule, Rfl: 3   rosuvastatin  (CRESTOR ) 10 MG tablet, Take 1 tablet (10 mg total) by mouth daily., Disp: 90 tablet, Rfl: 3   sertraline  (ZOLOFT ) 100 MG tablet, TAKE 1 TABLET(100 MG) BY MOUTH DAILY, Disp: 90 tablet, Rfl: 3   traMADol  (ULTRAM ) 50 MG tablet, TAKE 1 TABLET BY MOUTH TWICE DAILY AS NEEDED FOR PAIN, Disp: 60 tablet, Rfl: 0       Objective:     BP (!) 144/62   Pulse 66   Ht 5' 3 (1.6 m)   Wt 206 lb 3.2 oz (93.5 kg)   SpO2 97%   BMI 36.53 kg/m   Wt Readings from Last 3 Encounters:  05/06/24 206 lb 3.2 oz (93.5 kg)  04/15/24 200 lb 3.2 oz (90.8 kg)  03/31/24 199 lb 9.6 oz (90.5 kg)    Physical Exam  Physical Exam Vitals reviewed.  Constitutional:      Appearance: Normal appearance. Well-developed with normal weight.  Cardiovascular:     Rate and Rhythm: Normal rate and regular rhythm. Normal heart sounds. Normal peripheral pulses Pulmonary:     Normal breath sounds with normal effort Skin:    General: Skin is warm and dry without noticeable rash. Neurological:  General: No focal deficit present.  Psychiatric:        Mood and Affect: Mood, behavior and cognition normal      Assessment & Plan:  No diagnosis found.  No orders of the defined types were placed in this encounter.    Assessment and Plan    Type 2 diabetes mellitus   Her diabetes is well-controlled with an A1c of 6.8%. High sweet tea intake may affect blood sugar and it seems she may have hypoglycemia when not consumed. Continue Jardiance  25 mg daily. Encourage a diabetic diet with high protein, lean meats, beans, vegetables, and monitored carbohydrates. Advise reducing sweet tea and suggest flavored water. Instruct to monitor blood sugar, especially without sweet tea, for  hypoglycemia. Schedule follow-up blood work at the end of February.  Essential hypertension   Blood pressure varies significantly, possibly due to measurement technique. Continue Benazepril  20 mg daily. Advise taking blood pressure after sitting and post-breakfast for accuracy. Monitor blood pressure for one week and report. Encourage increased physical activity and dietary changes.  Hyperlipidemia   LDL is slightly elevated at 120 mg/dL, with a goal of <899 mg/dL and preferably less than 70 due to diabetes. Focus on lifestyle modifications. Encourage increased physical activity and dietary changes to reduce LDL. Reassess cholesterol in six months.  Neck mass, under evaluation   Awaiting CT scan for further evaluation. Schedule and complete CT scan.      Obesity would be significantly improved with small diet changes.    Will f/u with her after CT result.  Otherwise repeat HgbA1C end of Feb

## 2024-05-07 ENCOUNTER — Ambulatory Visit
Admission: RE | Admit: 2024-05-07 | Discharge: 2024-05-07 | Disposition: A | Discharge status: Home / Self Care | Source: Ambulatory Visit

## 2024-05-07 DIAGNOSIS — R221 Localized swelling, mass and lump, neck: Secondary | ICD-10-CM

## 2024-05-31 ENCOUNTER — Other Ambulatory Visit: Payer: Self-pay | Admitting: Nurse Practitioner

## 2024-05-31 DIAGNOSIS — M25511 Pain in right shoulder: Secondary | ICD-10-CM

## 2024-05-31 DIAGNOSIS — M542 Cervicalgia: Secondary | ICD-10-CM

## 2024-05-31 DIAGNOSIS — M25512 Pain in left shoulder: Secondary | ICD-10-CM

## 2024-06-02 NOTE — Progress Notes (Signed)
 Kristen Sparks                                          MRN: [id number]   06/02/2024   The [facility] Quality Team Specialist reviewed this patient medical record for the purposes of chart review for care gap closure. The following were reviewed: chart review for care gap closure-controlling blood pressure.    [facility] Quality Team

## 2024-06-26 ENCOUNTER — Emergency Department

## 2024-06-26 ENCOUNTER — Emergency Department: Admission: EM | Admit: 2024-06-26 | Discharge: 2024-06-26 | Disposition: A | Discharge status: Home / Self Care

## 2024-06-26 ENCOUNTER — Other Ambulatory Visit: Payer: Self-pay

## 2024-06-26 DIAGNOSIS — I10 Essential (primary) hypertension: Secondary | ICD-10-CM | POA: Diagnosis not present

## 2024-06-26 DIAGNOSIS — R142 Eructation: Secondary | ICD-10-CM | POA: Insufficient documentation

## 2024-06-26 DIAGNOSIS — E119 Type 2 diabetes mellitus without complications: Secondary | ICD-10-CM | POA: Insufficient documentation

## 2024-06-26 DIAGNOSIS — R0789 Other chest pain: Secondary | ICD-10-CM | POA: Diagnosis present

## 2024-06-26 DIAGNOSIS — R1013 Epigastric pain: Secondary | ICD-10-CM | POA: Insufficient documentation

## 2024-06-26 LAB — TROPONIN T, HIGH SENSITIVITY
Troponin T High Sensitivity: <15 ng/L (ref 0–19)
Troponin T High Sensitivity: <15 ng/L (ref 0–19)

## 2024-06-26 LAB — BASIC METABOLIC PANEL WITH GFR
Anion gap: 12 (ref 5–15)
BUN: 24 mg/dL — ABNORMAL HIGH (ref 8–23)
CO2: 24 mmol/L (ref 22–32)
Calcium: 9.6 mg/dL (ref 8.9–10.3)
Chloride: 101 mmol/L (ref 98–111)
Creatinine, Ser: 0.63 mg/dL (ref 0.44–1.00)
GFR, Estimated: >60 mL/min
Glucose, Bld: 191 mg/dL — ABNORMAL HIGH (ref 70–99)
Potassium: 4 mmol/L (ref 3.5–5.1)
Sodium: 137 mmol/L (ref 135–145)

## 2024-06-26 LAB — HEPATIC FUNCTION PANEL
ALT: 30 U/L (ref 0–44)
AST: 17 U/L (ref 15–41)
Albumin: 4.4 g/dL (ref 3.5–5.0)
Alkaline Phosphatase: 171 U/L — ABNORMAL HIGH (ref 38–126)
Bilirubin, Direct: 0.1 mg/dL (ref 0.0–0.2)
Indirect Bilirubin: 0.1 mg/dL — ABNORMAL LOW (ref 0.3–0.9)
Total Bilirubin: 0.2 mg/dL (ref 0.0–1.2)
Total Protein: 7 g/dL (ref 6.5–8.1)

## 2024-06-26 LAB — CBC
HCT: 41.7 % (ref 36.0–46.0)
Hemoglobin: 13.7 g/dL (ref 12.0–15.0)
MCH: 29.8 pg (ref 26.0–34.0)
MCHC: 32.9 g/dL (ref 30.0–36.0)
MCV: 90.7 fL (ref 80.0–100.0)
Platelets: 226 K/uL (ref 150–400)
RBC: 4.6 MIL/uL (ref 3.87–5.11)
RDW: 13.1 % (ref 11.5–15.5)
WBC: 8.9 K/uL (ref 4.0–10.5)
nRBC: 0 % (ref 0.0–0.2)

## 2024-06-26 LAB — LIPASE, BLOOD: Lipase: 66 U/L — ABNORMAL HIGH (ref 11–51)

## 2024-06-26 MED ORDER — ALUM & MAG HYDROXIDE-SIMETH 200-200-20 MG/5ML PO SUSP
30.0000 mL | Freq: Once | ORAL | Status: AC
  Administered 2024-06-26: 30 mL via ORAL
  Filled 2024-06-26: qty 30

## 2024-06-26 MED ORDER — LIDOCAINE 5 % EX PTCH
1.0000 | MEDICATED_PATCH | CUTANEOUS | Status: DC
  Administered 2024-06-26: 1 via TRANSDERMAL
  Filled 2024-06-26: qty 1

## 2024-06-26 MED ORDER — ALUMINUM-MAGNESIUM-SIMETHICONE 200-200-20 MG/5ML PO SUSP: 30.0000 mL | Freq: Three times a day (TID) | ORAL | 0 refills | Status: AC

## 2024-06-26 MED ORDER — ACETAMINOPHEN 500 MG PO TABS
1000.0000 mg | ORAL_TABLET | Freq: Once | ORAL | Status: AC
  Administered 2024-06-26: 1000 mg via ORAL
  Filled 2024-06-26: qty 2

## 2024-06-26 MED ORDER — FAMOTIDINE 20 MG PO TABS
20.0000 mg | ORAL_TABLET | Freq: Once | ORAL | Status: AC
  Administered 2024-06-26: 20 mg via ORAL
  Filled 2024-06-26: qty 1

## 2024-06-26 MED ORDER — ONDANSETRON 4 MG PO TBDP: 4.0000 mg | ORAL_TABLET | Freq: Three times a day (TID) | ORAL | 0 refills | Status: AC | PRN

## 2024-06-26 MED ORDER — FAMOTIDINE 20 MG PO TABS: 20.0000 mg | ORAL_TABLET | Freq: Two times a day (BID) | ORAL | 0 refills | Status: AC

## 2024-06-26 MED ORDER — ONDANSETRON 4 MG PO TBDP
4.0000 mg | ORAL_TABLET | Freq: Once | ORAL | Status: AC
  Administered 2024-06-26: 4 mg via ORAL
  Filled 2024-06-26: qty 1

## 2024-06-26 MED ORDER — LIDOCAINE VISCOUS HCL 2 % MT SOLN
15.0000 mL | Freq: Once | OROMUCOSAL | Status: AC
  Administered 2024-06-26: 15 mL via ORAL
  Filled 2024-06-26: qty 15

## 2024-06-26 NOTE — Discharge Instructions (Signed)
 Your evaluation in the emergency department was overall reassuring.  I suspect you have some strain of the muscles of your chest wall from your recent coughing, and you can use Tylenol  and Lidoderm  patches as needed for any ongoing discomfort.  I also suspect you have irritation of the lining of your stomach, and I have prescribed medications to help with this.  Please do follow-up with your primary care provider for reevaluation, and return to the emergency department with any new or worsening symptoms.

## 2024-06-26 NOTE — ED Provider Triage Note (Signed)
 Emergency Medicine Provider Triage Evaluation Note  Kristen Sparks , a 64 y.o. female  was evaluated in triage.  Pt complains of hypertension and weakness for the past week and chest pain, fatigue and pain under breast.   Physical Exam  BP (!) 158/85 (BP Location: Left Arm)   Pulse 77   Temp 98.2 F (36.8 C) (Oral)   Resp 20   Ht 5' 3 (1.6 m)   Wt 90.7 kg   SpO2 97%   BMI 35.43 kg/m  Gen:   Awake, no distress   Resp:  Normal effort  MSK:   Moves extremities without difficulty  Other:    Medical Decision Making  Medically screening exam initiated at 6:17 PM.  Appropriate orders placed.  Kristen Sparks was informed that the remainder of the evaluation will be completed by another provider, this initial triage assessment does not replace that evaluation, and the importance of remaining in the ED until their evaluation is complete.  Cardiac workup started.   Kristen Kirk NOVAK, FNP 06/26/24 1913

## 2024-06-26 NOTE — ED Triage Notes (Addendum)
 Pt states last night her BP was 177/99 and that she felt really bad and fatigued. Pt states she has been feeling that way for about a week. Pt states back pain and pain under her breast and in her chest. Pt has hx GERD, HTN, DM.

## 2024-06-26 NOTE — ED Provider Notes (Signed)
 "  [facility] Provider Note    Event Date/Time   First MD Initiated Contact with Patient 06/26/24 1925     (approximate)   History   Hypertension  Pt states last night her BP was 177/99 and that she felt really bad and fatigued. Pt states she has been feeling that way for about a week. Pt states back pain and pain under her breast and in her chest. Pt has hx GERD, HTN, DM.   HPI Kristen Sparks is a 64 y.o. female PMH hypertension, anxiety hyperlipidemia, rheumatoid arthritis, T2DM presents for evaluation of hypertension and multiple other complaints - for past two weeks had URI sx, nasal congestion, sinus pressure - had been coughing quite a lot during this period, developed chest pain x 3-4 days.  Coughing notably improved over the past 1-2 days. - no hemoptysis, no mucous production - no fevers - no abdominal pain  - Pain is primarily in left lateral chest wall extending back towards shoulder -No leg swelling.  No history of DVT/PE -Also notes a lot of belching and some nausea and epigastric discomfort -History of prior cholecystectomy -Did check her blood pressure once at home and was in the 170s over 90s which is atypical for her.  Is on benzapril for blood pressure management.     Physical Exam   Triage Vital Signs: ED Triage Vitals  Encounter Vitals Group     BP 06/26/24 1727 (!) 158/85     Girls Systolic BP Percentile --      Girls Diastolic BP Percentile --      Boys Systolic BP Percentile --      Boys Diastolic BP Percentile --      Pulse Rate 06/26/24 1727 77     Resp 06/26/24 1727 20     Temp 06/26/24 1727 98.2 F (36.8 C)     Temp Source 06/26/24 1727 Oral     SpO2 06/26/24 1727 97 %     Weight 06/26/24 1741 200 lb (90.7 kg)     Height 06/26/24 1741 5' 3 (1.6 m)     Head Circumference --      Peak Flow --      Pain Score 06/26/24 1738 4     Pain Loc --      Pain Education --      Exclude from Growth Chart --     Most recent  vital signs: Vitals:   06/26/24 1727 06/26/24 2048  BP: (!) 158/85 (!) 151/69  Pulse: 77 67  Resp: 20 18  Temp: 98.2 F (36.8 C) 97.9 F (36.6 C)  SpO2: 97% 100%     General: Awake, no distress.  Belches a few times during exam and also has a few episodes of coughing. CV:  Good peripheral perfusion. RRR, RP 2+. + Notable point tenderness to the left lateral chest wall.  No overlying skin changes. Resp:  Normal effort. CTAB Abd:  No distention.  Mild tenderness to palpation in epigastrium only.    ED Results / Procedures / Treatments   Labs (all labs ordered are listed, but only abnormal results are displayed) Labs Reviewed  BASIC METABOLIC PANEL WITH GFR - Abnormal; Notable for the following components:      Result Value   Glucose, Bld 191 (*)    BUN 24 (*)    All other components within normal limits  HEPATIC FUNCTION PANEL - Abnormal; Notable for the following components:   Alkaline Phosphatase 171 (*)  Indirect Bilirubin 0.1 (*)    All other components within normal limits  LIPASE, BLOOD - Abnormal; Notable for the following components:   Lipase 66 (*)    All other components within normal limits  CBC  TROPONIN T, HIGH SENSITIVITY  TROPONIN T, HIGH SENSITIVITY     EKG  See ED course below.   RADIOLOGY Radiology interpreted by myself radiology report reviewed.  No acute pathology identified.    PROCEDURES:  Critical Care performed: No  Procedures   MEDICATIONS ORDERED IN ED: Medications  lidocaine  (LIDODERM ) 5 % 1 patch (1 patch Transdermal Patch Applied 06/26/24 2051)  famotidine  (PEPCID ) tablet 20 mg (20 mg Oral Given 06/26/24 2052)  alum & mag hydroxide-simeth (MAALOX/MYLANTA) 200-200-20 MG/5ML suspension 30 mL (30 mLs Oral Given 06/26/24 2053)    And  lidocaine  (XYLOCAINE ) 2 % viscous mouth solution 15 mL (15 mLs Oral Given 06/26/24 2053)  ondansetron  (ZOFRAN -ODT) disintegrating tablet 4 mg (4 mg Oral Given 06/26/24 2052)  acetaminophen  (TYLENOL )  tablet 1,000 mg (1,000 mg Oral Given 06/26/24 2052)     IMPRESSION / MDM / ASSESSMENT AND PLAN / ED COURSE  I reviewed the triage vital signs and the nursing notes.                              DDX/MDM/AP: Differential diagnosis includes, but is not limited to, costochondritis, ACS, pneumonia, doubt pneumothorax.  Also suspect some component of GERD/dyspepsia likely contributing to presentation.  Doubt acute intra-abdominal pathology including pancreatitis or choledocholithiasis.  May have some element of bronchitis though appears coughing has notably improved over the past couple days per patient.  No concerning hypertension here, do not clinically suspect hypertensive emergency, will continue to monitor.  Plan: - Labs - Chest x-ray - EKG - GI cocktail, Zofran , Tylenol , Lidoderm  patch  Patient's presentation is most consistent with acute presentation with potential threat to life or bodily function.  The patient is on the cardiac monitor to evaluate for evidence of arrhythmia and/or significant heart rate changes.  ED course below.   Clinical Course as of 06/26/24 2212  Fri Jun 26, 2024  2016 CBC, BMP, troponin reviewed, unremarkable [MM]  2016 CXR: IMPRESSION: No active cardiopulmonary disease.   [MM]  2016 Ecg = sinus rhythm, rate 75, no gross ST elevation or depression, no significant repolarization abnormality, normal axis, normal intervals.  No clear evidence of ischemia nor arrhythmia on my interpretation.  Frequent PVCs present. [MM]  2115 LFTs reviewed, overall unremarkable [MM]  2126 Rpt trop stable [MM]  2208 Patient reevaluated, feeling much better after GI cocktail and Zofran .  Reassured by unremarkable cardiac workup.  Overall suspect costochondritis/MSK strain of chest wall.  Did offer cardiology referral, last though patient prefers to follow-up with her primary care provider which I believe is very reasonable.  Will Rx Maalox, famotidine , Zofran .  She has Tylenol   and Lidoderm  patches at home and will use these as well.  ED return precautions in place.  Patient agrees with plan.  Remains with overall unremarkable blood pressure here. [MM]    Clinical Course User Index [MM] Clarine Ozell LABOR, MD     FINAL CLINICAL IMPRESSION(S) / ED DIAGNOSES   Final diagnoses:  Left-sided chest wall pain  Belching  Dyspepsia     Rx / DC Orders   ED Discharge Orders          Ordered    aluminum-magnesium hydroxide-simethicone  (MAALOX) 200-200-20 MG/5ML SUSP  3 times daily before meals & bedtime        06/26/24 2211    famotidine  (PEPCID ) 20 MG tablet  2 times daily        06/26/24 2211    ondansetron  (ZOFRAN -ODT) 4 MG disintegrating tablet  Every 8 hours PRN        06/26/24 2211             Note:  This document was prepared using Dragon voice recognition software and may include unintentional dictation errors.   Clarine Ozell LABOR, MD 06/26/24 2212  "

## 2024-07-16 ENCOUNTER — Other Ambulatory Visit: Payer: Self-pay

## 2024-07-16 DIAGNOSIS — M542 Cervicalgia: Secondary | ICD-10-CM

## 2024-07-16 DIAGNOSIS — M25512 Pain in left shoulder: Secondary | ICD-10-CM

## 2024-07-16 DIAGNOSIS — M25511 Pain in right shoulder: Secondary | ICD-10-CM

## 2024-07-16 DIAGNOSIS — T3695XA Adverse effect of unspecified systemic antibiotic, initial encounter: Secondary | ICD-10-CM

## 2024-07-17 MED ORDER — FLUCONAZOLE 150 MG PO TABS: 150.0000 mg | ORAL_TABLET | Freq: Once | ORAL | 0 refills | Status: AC

## 2024-07-17 MED ORDER — MELOXICAM 15 MG PO TABS: 15.0000 mg | ORAL_TABLET | Freq: Every day | ORAL | 1 refills | Status: AC

## 2024-07-25 ENCOUNTER — Other Ambulatory Visit: Payer: Self-pay | Admitting: Nurse Practitioner

## 2024-07-25 DIAGNOSIS — F411 Generalized anxiety disorder: Secondary | ICD-10-CM

## 2024-07-25 DIAGNOSIS — M1991 Primary osteoarthritis, unspecified site: Secondary | ICD-10-CM

## 2024-07-28 ENCOUNTER — Other Ambulatory Visit: Payer: Self-pay

## 2024-07-28 DIAGNOSIS — M1991 Primary osteoarthritis, unspecified site: Secondary | ICD-10-CM

## 2024-07-30 NOTE — Telephone Encounter (Signed)
 Requested medication (s) are due for refill today: yes  Requested medication (s) are on the active medication list: yes  Last refill:  03/28/24  Future visit scheduled: yes  Notes to clinic:  Unable to refill per protocol, cannot delegate.      Requested Prescriptions  Pending Prescriptions Disp Refills   traMADol  (ULTRAM ) 50 MG tablet 60 tablet 0    Sig: Take 1 tablet (50 mg total) by mouth 2 (two) times daily as needed. for pain     Not Delegated - Analgesics:  Opioid Agonists Failed - 07/30/2024 12:17 PM      Failed - This refill cannot be delegated      Failed - Urine Drug Screen completed in last 360 days      Passed - Valid encounter within last 3 months    Recent Outpatient Visits           2 months ago Type 2 diabetes mellitus without complication in remission   [place] [facility] [place], MD   3 months ago Type 2 diabetes mellitus without complication, without long-term current use of insulin  [facility])   [facility]lene Parris LABOR, MD

## 2024-07-31 ENCOUNTER — Other Ambulatory Visit: Payer: Self-pay | Admitting: Internal Medicine

## 2024-07-31 ENCOUNTER — Other Ambulatory Visit: Payer: Self-pay

## 2024-07-31 DIAGNOSIS — M1991 Primary osteoarthritis, unspecified site: Secondary | ICD-10-CM

## 2024-07-31 NOTE — Progress Notes (Signed)
 She has not had this filled since October 2025 and was never filled by Dr. Zafirov.  Will need to wait until her appointment with me before filling and heads up I will likely not fill tramadol  and alprazolam  together.

## 2024-08-10 ENCOUNTER — Other Ambulatory Visit

## 2024-08-17 ENCOUNTER — Ambulatory Visit

## 2024-11-17 ENCOUNTER — Encounter: Admitting: Internal Medicine

## 2024-11-24 ENCOUNTER — Encounter: Admitting: Nurse Practitioner
